# Patient Record
Sex: Male | Born: 1935 | Race: White | Hispanic: No | Smoking: Former smoker
Health system: Southern US, Community
[De-identification: ages and names within clinical notes are randomized; demographics above are authoritative.]

## PROBLEM LIST (undated history)

## (undated) DIAGNOSIS — R7303 Prediabetes: Secondary | ICD-10-CM

## (undated) DIAGNOSIS — I2510 Atherosclerotic heart disease of native coronary artery without angina pectoris: Secondary | ICD-10-CM

## (undated) DIAGNOSIS — K219 Gastro-esophageal reflux disease without esophagitis: Secondary | ICD-10-CM

## (undated) DIAGNOSIS — J40 Bronchitis, not specified as acute or chronic: Secondary | ICD-10-CM

## (undated) DIAGNOSIS — R0602 Shortness of breath: Secondary | ICD-10-CM

## (undated) DIAGNOSIS — E119 Type 2 diabetes mellitus without complications: Secondary | ICD-10-CM

## (undated) DIAGNOSIS — J45909 Unspecified asthma, uncomplicated: Secondary | ICD-10-CM

## (undated) DIAGNOSIS — E669 Obesity, unspecified: Secondary | ICD-10-CM

## (undated) DIAGNOSIS — I10 Essential (primary) hypertension: Secondary | ICD-10-CM

## (undated) DIAGNOSIS — G5603 Carpal tunnel syndrome, bilateral upper limbs: Secondary | ICD-10-CM

## (undated) DIAGNOSIS — E7800 Pure hypercholesterolemia, unspecified: Secondary | ICD-10-CM

## (undated) DIAGNOSIS — M1990 Unspecified osteoarthritis, unspecified site: Secondary | ICD-10-CM

## (undated) DIAGNOSIS — I639 Cerebral infarction, unspecified: Secondary | ICD-10-CM

## (undated) DIAGNOSIS — G4730 Sleep apnea, unspecified: Secondary | ICD-10-CM

## (undated) DIAGNOSIS — E785 Hyperlipidemia, unspecified: Secondary | ICD-10-CM

## (undated) HISTORY — PX: EYE SURGERY: SHX253

## (undated) HISTORY — DX: Obesity, unspecified: E66.9

## (undated) HISTORY — PX: HERNIA REPAIR: SHX51

## (undated) HISTORY — DX: Shortness of breath: R06.02

## (undated) HISTORY — DX: Hyperlipidemia, unspecified: E78.5

## (undated) HISTORY — DX: Essential (primary) hypertension: I10

---

## 1963-10-28 HISTORY — PX: TESTICLE REMOVAL: SHX68

## 1964-10-27 HISTORY — PX: FRACTURE SURGERY: SHX138

## 1998-04-10 ENCOUNTER — Ambulatory Visit: Admission: RE | Admit: 1998-04-10 | Discharge: 1998-04-10 | Payer: Self-pay | Admitting: Internal Medicine

## 1998-11-12 ENCOUNTER — Ambulatory Visit (HOSPITAL_COMMUNITY): Admission: RE | Admit: 1998-11-12 | Discharge: 1998-11-12 | Payer: Self-pay | Admitting: Cardiology

## 1999-08-04 ENCOUNTER — Ambulatory Visit (HOSPITAL_COMMUNITY): Admission: RE | Admit: 1999-08-04 | Discharge: 1999-08-04 | Payer: Self-pay | Admitting: Neurosurgery

## 1999-08-04 ENCOUNTER — Encounter: Payer: Self-pay | Admitting: Neurosurgery

## 1999-10-28 HISTORY — PX: CARDIAC CATHETERIZATION: SHX172

## 2000-02-20 ENCOUNTER — Encounter: Admission: RE | Admit: 2000-02-20 | Discharge: 2000-02-20 | Payer: Self-pay | Admitting: Neurosurgery

## 2000-02-20 ENCOUNTER — Encounter: Payer: Self-pay | Admitting: Neurosurgery

## 2000-06-24 ENCOUNTER — Ambulatory Visit (HOSPITAL_COMMUNITY): Admission: RE | Admit: 2000-06-24 | Discharge: 2000-06-24 | Payer: Self-pay | Admitting: Cardiology

## 2001-01-13 ENCOUNTER — Encounter: Admission: RE | Admit: 2001-01-13 | Discharge: 2001-04-13 | Payer: Self-pay | Admitting: Gastroenterology

## 2007-10-16 ENCOUNTER — Inpatient Hospital Stay (HOSPITAL_COMMUNITY): Admission: EM | Admit: 2007-10-16 | Discharge: 2007-10-19 | Payer: Self-pay | Admitting: Emergency Medicine

## 2007-10-18 ENCOUNTER — Encounter (INDEPENDENT_AMBULATORY_CARE_PROVIDER_SITE_OTHER): Payer: Self-pay | Admitting: Internal Medicine

## 2009-07-11 ENCOUNTER — Ambulatory Visit: Payer: Self-pay | Admitting: Diagnostic Radiology

## 2009-07-11 ENCOUNTER — Encounter: Payer: Self-pay | Admitting: Emergency Medicine

## 2009-07-12 ENCOUNTER — Inpatient Hospital Stay (HOSPITAL_COMMUNITY): Admission: EM | Admit: 2009-07-12 | Discharge: 2009-07-15 | Payer: Self-pay

## 2009-07-17 ENCOUNTER — Inpatient Hospital Stay (HOSPITAL_COMMUNITY): Admission: EM | Admit: 2009-07-17 | Discharge: 2009-07-26 | Payer: Self-pay | Admitting: Emergency Medicine

## 2009-07-23 ENCOUNTER — Encounter (INDEPENDENT_AMBULATORY_CARE_PROVIDER_SITE_OTHER): Payer: Self-pay | Admitting: General Surgery

## 2009-07-25 ENCOUNTER — Ambulatory Visit: Payer: Self-pay | Admitting: Vascular Surgery

## 2009-08-29 ENCOUNTER — Encounter: Admission: RE | Admit: 2009-08-29 | Discharge: 2009-08-29 | Payer: Self-pay | Admitting: Internal Medicine

## 2011-01-31 LAB — CBC
HCT: 30.2 % — ABNORMAL LOW (ref 39.0–52.0)
HCT: 30.6 % — ABNORMAL LOW (ref 39.0–52.0)
HCT: 30.7 % — ABNORMAL LOW (ref 39.0–52.0)
HCT: 31 % — ABNORMAL LOW (ref 39.0–52.0)
HCT: 38 % — ABNORMAL LOW (ref 39.0–52.0)
Hemoglobin: 10.8 g/dL — ABNORMAL LOW (ref 13.0–17.0)
Hemoglobin: 13.2 g/dL (ref 13.0–17.0)
MCHC: 33.6 g/dL (ref 30.0–36.0)
MCHC: 34 g/dL (ref 30.0–36.0)
MCHC: 34.3 g/dL (ref 30.0–36.0)
MCHC: 34.3 g/dL (ref 30.0–36.0)
MCHC: 34.9 g/dL (ref 30.0–36.0)
MCHC: 34.8 g/dL (ref 30.0–36.0)
MCV: 90.4 fL (ref 78.0–100.0)
MCV: 91.1 fL (ref 78.0–100.0)
MCV: 91.3 fL (ref 78.0–100.0)
MCV: 91.9 fL (ref 78.0–100.0)
MCV: 92 fL (ref 78.0–100.0)
MCV: 92.5 fL (ref 78.0–100.0)
MCV: 91.7 fL (ref 78.0–100.0)
Platelets: 150 10*3/uL (ref 150–400)
Platelets: 174 10*3/uL (ref 150–400)
Platelets: 239 10*3/uL (ref 150–400)
Platelets: 250 10*3/uL (ref 150–400)
Platelets: 261 10*3/uL (ref 150–400)
Platelets: 274 10*3/uL (ref 150–400)
Platelets: 278 10*3/uL (ref 150–400)
Platelets: 300 10*3/uL (ref 150–400)
Platelets: 201 K/uL (ref 150–400)
RBC: 3.33 MIL/uL — ABNORMAL LOW (ref 4.22–5.81)
RBC: 3.34 MIL/uL — ABNORMAL LOW (ref 4.22–5.81)
RBC: 3.35 MIL/uL — ABNORMAL LOW (ref 4.22–5.81)
RBC: 3.57 MIL/uL — ABNORMAL LOW (ref 4.22–5.81)
RBC: 4.14 MIL/uL — ABNORMAL LOW (ref 4.22–5.81)
RDW: 13.1 % (ref 11.5–15.5)
RDW: 14 % (ref 11.5–15.5)
RDW: 12.5 % (ref 11.5–15.5)
WBC: 13.9 10*3/uL — ABNORMAL HIGH (ref 4.0–10.5)
WBC: 3.9 10*3/uL — ABNORMAL LOW (ref 4.0–10.5)
WBC: 4.6 10*3/uL (ref 4.0–10.5)
WBC: 8.5 10*3/uL (ref 4.0–10.5)
WBC: 8.8 10*3/uL (ref 4.0–10.5)
WBC: 11.6 K/uL — ABNORMAL HIGH (ref 4.0–10.5)

## 2011-01-31 LAB — URINALYSIS, ROUTINE W REFLEX MICROSCOPIC
Glucose, UA: NEGATIVE mg/dL
Ketones, ur: 15 mg/dL — AB
Leukocytes, UA: NEGATIVE
Leukocytes, UA: NEGATIVE
Nitrite: NEGATIVE
Nitrite: NEGATIVE
Protein, ur: 100 mg/dL — AB
Specific Gravity, Urine: >1.046 — ABNORMAL HIGH (ref 1.005–1.030)
Specific Gravity, Urine: 1.021 (ref 1.005–1.030)
Urobilinogen, UA: 0.2 mg/dL (ref 0.0–1.0)
pH: 6 (ref 5.0–8.0)
pH: 5 (ref 5.0–8.0)

## 2011-01-31 LAB — GLUCOSE, CAPILLARY
Glucose-Capillary: 122 mg/dL — ABNORMAL HIGH (ref 70–99)
Glucose-Capillary: 122 mg/dL — ABNORMAL HIGH (ref 70–99)
Glucose-Capillary: 126 mg/dL — ABNORMAL HIGH (ref 70–99)
Glucose-Capillary: 129 mg/dL — ABNORMAL HIGH (ref 70–99)
Glucose-Capillary: 130 mg/dL — ABNORMAL HIGH (ref 70–99)
Glucose-Capillary: 137 mg/dL — ABNORMAL HIGH (ref 70–99)
Glucose-Capillary: 141 mg/dL — ABNORMAL HIGH (ref 70–99)
Glucose-Capillary: 146 mg/dL — ABNORMAL HIGH (ref 70–99)
Glucose-Capillary: 149 mg/dL — ABNORMAL HIGH (ref 70–99)

## 2011-01-31 LAB — COMPREHENSIVE METABOLIC PANEL
ALT: 18 U/L (ref 0–53)
ALT: 19 U/L (ref 0–53)
AST: 25 U/L (ref 0–37)
AST: 27 U/L (ref 0–37)
AST: 31 U/L (ref 0–37)
AST: 43 U/L — ABNORMAL HIGH (ref 0–37)
Albumin: 2.7 g/dL — ABNORMAL LOW (ref 3.5–5.2)
Albumin: 2.8 g/dL — ABNORMAL LOW (ref 3.5–5.2)
Albumin: 3.4 g/dL — ABNORMAL LOW (ref 3.5–5.2)
Alkaline Phosphatase: 46 U/L (ref 39–117)
BUN: 20 mg/dL (ref 6–23)
CO2: 27 mEq/L (ref 19–32)
CO2: 27 mEq/L (ref 19–32)
Calcium: 7.4 mg/dL — ABNORMAL LOW (ref 8.4–10.5)
Calcium: 8.1 mg/dL — ABNORMAL LOW (ref 8.4–10.5)
Calcium: 8.2 mg/dL — ABNORMAL LOW (ref 8.4–10.5)
Chloride: 92 mEq/L — ABNORMAL LOW (ref 96–112)
Chloride: 96 mEq/L (ref 96–112)
Creatinine, Ser: 0.83 mg/dL (ref 0.4–1.5)
Creatinine, Ser: 1.01 mg/dL (ref 0.4–1.5)
Creatinine, Ser: 1.06 mg/dL (ref 0.4–1.5)
GFR calc Af Amer: >60 mL/min (ref >60)
GFR calc Af Amer: >60 mL/min (ref >60)
GFR calc Af Amer: >60 mL/min (ref >60)
GFR calc Af Amer: >60 mL/min (ref >60)
GFR calc non Af Amer: >60 mL/min (ref >60)
GFR calc non Af Amer: >60 mL/min (ref >60)
Potassium: 3.2 mEq/L — ABNORMAL LOW (ref 3.5–5.1)
Sodium: 135 mEq/L (ref 135–145)
Sodium: 137 mEq/L (ref 135–145)
Total Bilirubin: 1.1 mg/dL (ref 0.3–1.2)
Total Bilirubin: 1.7 mg/dL — ABNORMAL HIGH (ref 0.3–1.2)
Total Protein: 5.7 g/dL — ABNORMAL LOW (ref 6.0–8.3)
Total Protein: 5.9 g/dL — ABNORMAL LOW (ref 6.0–8.3)

## 2011-01-31 LAB — BASIC METABOLIC PANEL
BUN: 10 mg/dL (ref 6–23)
BUN: 13 mg/dL (ref 6–23)
BUN: 13 mg/dL (ref 6–23)
CO2: 26 mEq/L (ref 19–32)
CO2: 29 mEq/L (ref 19–32)
Calcium: 7.8 mg/dL — ABNORMAL LOW (ref 8.4–10.5)
Calcium: 9.6 mg/dL (ref 8.4–10.5)
Chloride: 103 mEq/L (ref 96–112)
Chloride: 106 mEq/L (ref 96–112)
Creatinine, Ser: 0.83 mg/dL (ref 0.4–1.5)
Creatinine, Ser: 0.84 mg/dL (ref 0.4–1.5)
Creatinine, Ser: 0.85 mg/dL (ref 0.4–1.5)
GFR calc Af Amer: >60 mL/min (ref >60)
GFR calc Af Amer: >60 mL/min (ref >60)
GFR calc Af Amer: 56 mL/min — ABNORMAL LOW (ref >60)
GFR calc non Af Amer: 48 mL/min — ABNORMAL LOW (ref >60)
GFR calc non Af Amer: >60 mL/min (ref >60)
Glucose, Bld: 90 mg/dL (ref 70–99)
Potassium: 3.5 mEq/L (ref 3.5–5.1)
Potassium: 3.7 mEq/L (ref 3.5–5.1)
Potassium: 4.2 mEq/L (ref 3.5–5.1)
Potassium: 3.9 mEq/L (ref 3.5–5.1)
Sodium: 138 mEq/L (ref 135–145)
Sodium: 142 mEq/L (ref 135–145)

## 2011-01-31 LAB — DIFFERENTIAL
Basophils Absolute: 0 K/uL (ref 0.0–0.1)
Basophils Relative: 0 % (ref 0–1)
Basophils Relative: 0 % (ref 0–1)
Eosinophils Absolute: 0 K/uL (ref 0.0–0.7)
Eosinophils Relative: 0 % (ref 0–5)
Lymphocytes Relative: 20 % (ref 12–46)
Lymphocytes Relative: 14 % (ref 12–46)
Lymphs Abs: 1.6 K/uL (ref 0.7–4.0)
Monocytes Absolute: 1.2 10*3/uL — ABNORMAL HIGH (ref 0.1–1.0)
Monocytes Relative: 28 % — ABNORMAL HIGH (ref 3–12)
Monocytes Relative: 10 % (ref 3–12)
Neutro Abs: 2 10*3/uL (ref 1.7–7.7)
Neutro Abs: 8.8 10*3/uL — ABNORMAL HIGH (ref 1.7–7.7)
Neutrophils Relative %: 75 % (ref 43–77)

## 2011-01-31 LAB — URINE CULTURE
Colony Count: NO GROWTH
Colony Count: NO GROWTH
Culture: NO GROWTH
Culture: NO GROWTH

## 2011-01-31 LAB — URINE MICROSCOPIC-ADD ON

## 2011-01-31 LAB — APTT: aPTT: 23 seconds — ABNORMAL LOW (ref 24–37)

## 2011-01-31 LAB — PREALBUMIN: Prealbumin: 9.5 mg/dL — ABNORMAL LOW (ref 18.0–45.0)

## 2011-01-31 LAB — BASIC METABOLIC PANEL WITH GFR
BUN: 22 mg/dL (ref 6–23)
Chloride: 102 meq/L (ref 96–112)
Creatinine, Ser: 1.5 mg/dL (ref 0.4–1.5)
GFR calc non Af Amer: 46 mL/min — ABNORMAL LOW (ref >60)
Glucose, Bld: 96 mg/dL (ref 70–99)

## 2011-01-31 LAB — HEMOCCULT GUIAC POC 1CARD (OFFICE): Fecal Occult Bld: POSITIVE

## 2011-01-31 LAB — PROTIME-INR
INR: 1 (ref 0.00–1.49)
Prothrombin Time: 13 s (ref 11.6–15.2)

## 2011-03-11 NOTE — H&P (Signed)
Jeff Watkins, Jeff Watkins                  ACCOUNT NO.:  [id number]   MEDICAL RECORD NO.:  [id number]          PATIENT TYPE:  EMS   LOCATION:  MAJO                         FACILITY:  [facility]   PHYSICIAN:  Kela Millin, M.D.DATE OF BIRTH:  Feb 12, 1936   DATE OF ADMISSION:  10/16/2007  DATE OF DISCHARGE:                              HISTORY & PHYSICAL   PRIMARY CARE PHYSICIAN:  Tasia Catchings, M.D.   CHIEF COMPLAINT:  Left upper extremity numbness and weakness.   HISTORY OF PRESENT ILLNESS:  The patient is a 75 year old, obese, white  male with past medical history significant for diabetes that is diet  controlled, hypertension, hypercholesterolemia who presents with above  complaints. He states that the numbness and tingling/paresthesias began  this morning and also his arm felt limp and his grasp/grip was weaker.  Because the symptoms persisted for some time, he came to the ER. He  denies blurred vision, dysphagia, slurred speech, chest pain, shortness  of breath, melena, and no hematochezia. He admits to a cough that he has  had for the past couple of weeks and states that he was recently  diagnosed with bronchitis and started on Tussionex and Zithromax which  he has not taken because his symptoms have improved.   The patient was seen in the ER and a CT scan of his brain was done which  was negative for any acute intracranial findings. He is admitted for  further evaluation and management.   PAST MEDICAL HISTORY:  1. As above.  2. Status post cardiac catheterization in 2001 which revealed      borderline obstructive LAD lesion.   MEDICATIONS:  1. Aspirin 81 mg daily.  2. Caduet 40/10 mg daily.  3. Fish oil.  4. Hyzaar 50/12.5 mg daily.  5. Prilosec 40 mg daily.  6. Multivitamin daily.  7. Vitamin D.  8. Slow release iron tablets.   ALLERGIES:  PENICILLIN and SULFA. He denies codeine allergy stating that  it has caused him to be drowsy in the past but he does not remember  any  allergic reaction to it.   SOCIAL HISTORY:  He quit tobacco 50 years ago. He denies alcohol.   FAMILY HISTORY:  His father had a stroke at the age of 71.   REVIEW OF SYSTEMS:  As per HPI, other review of systems negative.   PHYSICAL EXAMINATION:  GENERAL:  The patient is an obese, pleasant,  elderly white male. He is in no apparent distress.  VITAL SIGNS:  His temperature is 98.9 with a blood pressure of 153/89,  pulse of 83, respiratory rate of 18, O2 sat of 94%.  HEENT:  PERRL, EOMI, sclera anicteric, no facial asymmetry. No oral  exudates.  NECK:  Supple, no carotid bruits appreciated, no adenopathy, no  thyromegaly.  LUNGS:  Clear to auscultation bilaterally, no crackles or wheezes.  CARDIOVASCULAR:  Regular rate and rhythm, normal S1 and S2.  ABDOMEN:  Obese, bowel sounds present, nontender, nondistended, no  organomegaly and no masses palpable.  EXTREMITIES:  No cyanosis, no edema.  NEUROLOGIC:  He is alert and  oriented x3. Cranial nerves II-XII are  grossly intact, his strength is 5/5 and symmetric. Sensory is grossly  intact.   LABORATORY DATA:  CT scan of the head as per HPI. His sodium is 138 with  a potassium of 4.2, chloride 105, BUN 15, glucose 99. His creatinine is  1.0, white cell count of 6, hemoglobin 14.1, hematocrit of 41.5,  platelet count of 249.   ASSESSMENT/PLAN:  1. Left arm paresthesias/weakness - now resolved. Probable transient      ischemic attack. Will obtain an MRI, a 2-D echo, carotid Doppler      ultrasound. Also obtain a homocystine level, recheck fasting lipid      profile and hemoglobin A1c level. Place patient on full dose      aspirin - 325 mg. Follow and consider a neurology consultation      depending on above studies.  2. Diabetes mellitus - diet controlled per patient report, obtain a      hemoglobin A1c although, follow with sliding scale insulin and      follow.  3. Hypertension - continue outpatient medications.  4.  Hypercholesterolemia - continue outpatient medications. Recheck      fasting lipid profile as above.      Kela Millin, M.D.  Electronically Signed     ACV/MEDQ  D:  10/16/2007  T:  10/16/2007  Job:  [id number]

## 2011-03-11 NOTE — Consult Note (Signed)
Jeff Watkins, Jeff Watkins                  ACCOUNT NO.:  [id number]   MEDICAL RECORD NO.:  [id number]          PATIENT TYPE:  INP   LOCATION:  3008                         FACILITY:  [facility]   PHYSICIAN:  Deanna Artis. Hickling, M.D.DATE OF BIRTH:  23-Aug-1936   DATE OF CONSULTATION:  10/18/2007  DATE OF DISCHARGE:                                 CONSULTATION   CHIEF COMPLAINT:  Left-sided weakness and clumsiness.   HISTORY OF PRESENT CONDITION:  [name] is a 75 year old gentleman who  presented with complaints of left upper extremity numbness and weakness.  This occurred around 8:45 in the morning on October 16, 2007.  He had  awakened and was having his coffee.  He suddenly noted numbness and  paresthesias in his left arm, and difficulty with his grip.  He did not  awaken his wife who is a Engineer, civil (consulting).  Finally he had did so and she felt that  he should come to the hospital for evaluation of possible stroke.  He  arrived around 1132 hours and 45 minutes into his symptoms.  By that  time, the symptoms had begun to clear and they had completely cleared 4-  5 hours following the stroke.   The patient had a CT scan of the brain which showed some atrophy and  some small-vessel white matter changes.  MRI scan of the brain on  October 17, 2007 that showed restricted diffusion, but also  abnormalities on the ADC maps, the FLAIR and the T2 images.  The MRA  showed evidence of narrowing of the proximal portion of the right middle  cerebral artery and also some narrowing in the posterior cerebral  arteries.  There were mild atherosclerotic changes elsewhere, but no  true occlusive disease.  Finally, the MRI scan showed diffuse  subcortical white matter disease in the centrum semiovale, frontal,  parietal also within bilaterally lesions in the mid brain and asymmetric  lesions in the cerebellar white matter.  All this strongly suggested the  presence of some small-vessel white matter disease and not  cardioembolic  disease.   The patient has a number risk factors for stroke that include  hypertension, dyslipidemia, morbid obesity, heart disease, obstructive  sleep apnea and remote history of smoking.   Patient has been followed long-term by Dr. Tasia Catchings.  Dr. Sherin Quarry  notes in his office dictation of Feb 25, 2007, the patient lost 80 pounds  (he has gained about 20-30 of it back.)  His hemoglobin A1c dropped from  8.5-5.7.  Currently it is 6.0.  The patient has not required oral  hypoglycemics has maintained his LDL below 100.   PAST MEDICAL HISTORY:  1. Sleep apnea.  This was proven on a 1999 sleep study which also      showed restless leg syndrome.  His symptoms were mild and the      obstructive sleep apnea improved greatly once he lost weight.  2. 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  4. Cervical spondylosis with pain in his right shoulder and right arm      in 1998 which has been managed conservatively with nonsteroidal      medications.  5. GI bleeding that was occasioned by the use of nonsteroidals.  6. Intermittent bronchitis which has been quiescent since 1996.  7. Chronic dyspnea on exertion without chest pain.  Multiple stress      tests and cardiac catheterization with nonocclusive disease.  The      patient also improved with weight loss there as well.  8. The patient apparently had outside screening of carotid Dopplers in      2008 which were okay.  This had to have been before May 2008 and      needs to be repeated.  9. He had colonic polyps and diverticulosis there were seen on      colonoscopy in 2003.  10.  10.  The patient has mild iron      deficiency, which on repeat colonoscopy found only diverticuli.  10.He has had external otitis media.  11.Hypertension treated with multiple antihypertensive agents.  12.Dyslipidemia, currently with  a low HDL, but normal triglyceride and      LDL.  13.Peptic ulcer disease with remote bleeding.  14.Gastroesophageal reflux disease.  15.Erectile dysfunction.  16.Occasional headaches.   CURRENT MEDICATIONS:  1. Aspirin 325 mg a day.  2. Norvasc 10 mg a day.  3. Protonix 40 mg a day.  4. Hydrochlorothiazide 12.5 mg a day.  5. Lipitor 40 mg a day.  6. He is on Cozaar 50 mg a day.  7. Nu-Iron 150 mg twice daily.  8. Multivitamins daily.  9. He has sliding-scale insulin, but has not needed it.  10.Plavix was just added to his treatment 75 mg a day.  11.He is also on Lovenox for DVT prophylaxis.  12.Senokot for constipation.   ALLERGIES:  1. SULFA.  2. PENICILLIN.   PAST SURGICAL HISTORY:  1. Left inguinal herniorrhaphy.  2. Right undescended testicle which was removed.  3. The patient had severe comminuted fracture in his right arm with      multiple operations.   FAMILY HISTORY:  Father died at age 72 of cerebrovascular accident and  aspiration pneumonia.  His first stroke occurred when he was 62.  Mother  died age 33 in a motor vehicle accident.  The patient has 3 brothers  alive with peptic ulcer disease, 2 have had surgery, 1 with colon cancer  at age 20.  He has 2 children, 1 daughter died of AIDS and a son who is  alive and well.  The patient's paternal aunt also had stroke.   SOCIAL HISTORY:  The patient lives in [place].  Retired from working  as a Teaching laboratory technician at [employer]  He is a Engineer, agricultural.  He  works part-time in [employer], dispensing.  He has  been married since  1964.  He used tobacco as an adolescent.  He does not use alcohol or  drugs.   REVIEW OF SYSTEMS:  Negative except as noted above in the extensive past  medical history.   PHYSICAL EXAMINATION:  GENERAL:  This is a well-developed, well-  nourished, right-handed gentleman in no acute distress, obese.  VITAL SIGNS:  Temperature 98.3, blood pressure 137/77, resting pulse 57,   respirations 20, capillary glucose 130, oxygen saturation 94% on room  air.  EARS/NOSE/THROAT:  No infections.  No bruits.  NECK:  Supple.  LUNGS:  Clear.  HEART:  No murmurs.  Pulses are normal.  ABDOMEN:  Protuberant.  Bowel sounds are normal.  No hepatosplenomegaly.  EXTREMITIES:  Normal.  NEUROLOGIC:  The patient awake and alert without dysphasia, dyspraxia.  CRANIAL NERVES:  Round reactive pupils.  Visual fields are full.  His  fundi are very difficult to see because the pupils are so small.  Extraocular movements full and conjugate.  Symmetric facial strength and  sensation.  Midline tongue and uvula.  Air conduction greater than bone  conduction bilaterally.  MOTOR:  Normal strength, tone and mass.  Good fine motor movements.  No  pronator drift.  Sensation intact to cold vibration, stereognosis.  He  does not have significant peripheral polyneuropathy despite his history  of diabetes.  CEREBELLAR:  Good finger-to-nose, rapid alternating movements and heel-  knee-shin.  Gait was normal.  Interestingly, the patient cannot tandem.  I suspect this is from his cerebellar and mid brain lesions.  This is an  old problem.  His Romberg is negative.  Deep tendon reflexes were  diminished.  He had bilateral flexor plantar responses.   IMPRESSION:  1. Subacute right parietal subcentimeter white matter lacunar-type      lesion thought to be thrombotic process, 434.01.  2. Multiple risk factors for stroke including hypertension, controlled      type 2 diabetes mellitus, morbid obesity, atherosclerotic      cardiovascular disease, dyslipidemia, remote smoking, obstructive      sleep apnea.  3. The patient has lost weight.  His hemoglobin A1c today is 6, serum      homocysteine is 9, LDL 70, HDL was low at 28, triglycerides 101.      The patient is physically and mentally quite active.  He has not      smoked since adolescence.  MRI scan and MRA are described above.      The narrowing in  the right middle cerebral artery is not      etiologically related to his small vessel white matter disease      stroke.  I suspect that he has had other strokes that are silent.  4. He has sinus bradycardia.  He carotid Dopplers and 2-D      echocardiogram are pending.   RECOMMENDATIONS:  1. I agree with change to Plavix 75 mg a day.  I would discontinue      aspirin altogether.  The combination is not better than Plavix      alone and can increase the risk of bleeding (MATCH trial).  2. Continue current treatment of his other conditions.  I do not know      if we can improve his low HDL by changing his statin drugs or      changing possibly to Tricor.  3. The patient was not a candidate for IV or intra-arterial TPA      because of the time to presentation was 2 hours and 45 minutes      after he was last normal and his improving symptoms.  4. Before discharge, he needs counseling concerning diet.  He will      continue compliance with medical regimen, maintaining an active      lifestyle.  5. Follow up with [place] as needed.  Patient's NIH stroke scale carried      out at 9:45 was zero. His modified Rankin scale is zero.      Deanna Artis. Sharene Skeans, M.D.  Electronically Signed  [facility]/MEDQ  D:  10/18/2007  T:  10/18/2007  Job:  [id number]   cc:   Tasia Catchings, M.D.

## 2011-03-11 NOTE — Discharge Summary (Signed)
NAMEOSCAR, HANK NO.:  [id number]   MEDICAL RECORD NO.:  [id number]          PATIENT TYPE:  INP   LOCATION:  3008                         FACILITY:  [facility]   PHYSICIAN:  Tasia Catchings, M.D.   DATE OF BIRTH:  February 17, 1936   DATE OF ADMISSION:  10/16/2007  DATE OF DISCHARGE:  10/19/2007                               DISCHARGE SUMMARY   DISCHARGE DIAGNOSES:  1. Right cerebrovascular accident with transient left-sided arm      weakness which has resolved with negative embolic workup.  2. Hypertension.  3. Diabetes.  4. Nonocclusive coronary disease.  5. Gastroesophageal reflux disease.  6. Hypercholesterolemia with low HDL.  7. Obesity.  8. Low testosterone.  9. Facial herpes simplex.  10.Colon polyps.  11.Sleep apnea.   DISCHARGE MEDICATIONS:  1. Plavix 75 mg daily which is new and he is to discontinue all      aspirin.  2. Norvasc 10 and Lipitor 40 in the form of Caduet daily.  3. Prilosec OTC 20 mg daily.  4. Hyzaar 12.5/50 daily.  5. Nu-Iron two tablets.  6. AndroGel 1% two packs daily.  7. Zovirax and Allegra as needed.   CONDITION ON DISCHARGE:  Improved.   DIET:  1500 calorie, ADA.   ACTIVITY:  Without restriction.   FOLLOW UP:  Follow up at his regularly scheduled appointment.   HOSPITAL COURSE:  Mr. Edmonds was admitted on December 20, with a several  hour history of incoordination, weakness and numbness of his left arm  that by the time he reached the emergency room was improving and by the  time he was admitted by our [facility] was absolutely gone.  He  was admitted anyway for further workup.  The remainder of his physical  exam was unremarkable.  Laboratory data was normal including a  hemoglobin A1c of 6.0.  His homocystine level was normal as well.  CT  scan of the head was unremarkable, but an MRI revealed a small lacunar  stroke in the right parietal area that was consistent with his left-  sided weakness as well as multiple,  small, white matter strokes.  MRA  was unremarkable with the exception of a narrowing of the middle  cerebral artery which appeared to be anatomically distinct from where  his stroke was.  Embolic workup was negative including  carotid Dopplers and 2-D echocardiogram.  The patient was seen by Dr.  Ellison Carwin and underwent evaluation neurologically and was found  to have had probably a right CVA with resolution.  His recommendation  was Plavix over aspirin and he was discharged on the above medications.      Tasia Catchings, M.D.  Electronically Signed     JW/MEDQ  D:  10/19/2007  T:  10/19/2007  Job:  [id number]

## 2011-03-14 NOTE — Cardiovascular Report (Signed)
[facility]  Patient:    Jeff Watkins, Jeff Watkins                         MRN: [id number] Proc. Date: 06/24/00 Adm. Date:  [id number] Attending:  Corliss Marcus CC:         Genene Churn. Sherin Quarry, M.D.  [facility]   Cardiac Catheterization  PROCEDURE PERFORMED 1. Left heart catheterization. 2. Coronary angiography. 3. Intravascular ultrasound, left anterior descending artery. 4. Intravascular ultrasound, right coronary artery (REVERSAL study).  INDICATIONS:  Jeff Watkins is a 75 year old man who is now 20 months after a cardiac catheterization revealing mild nonobstructive atherosclerotic coronary vascular disease; this was performed because of dyspnea on exertion.  He was enrolled in the REVERSAL trial at that time and is now completing 20 months of treatment with either Lipitor 80 mg or Pravachol 40 mg p.o. q.d.  He has been doing well recently.  He denies any recent symptoms.  PROCEDURAL NOTE:  Left heart catheterization was performed following the percutaneous insertion of a 6-French catheter sheath utilizing an anterior approach over a guiding J wire.  A 5-French #4 left Judkins catheter was used to perform coronary angiography of the left coronary in multiple LAO and RAO projections.  This was exchanged from a 7-French FR4 Sci-Med Wise-Guide guiding catheter, which was used to perform right coronary artery angiography, as well as intravascular ultrasound using the Sci-Med Ultra-Cross catheter. The patient received heparin 3000 units prior to initiation of the intravascular ultrasound, resulting in an ACT of 181 seconds; he then received an additional 1000 units of heparin.  After completion of the right coronary artery ultrasound, the 7-French FR4 Wise-Guide guiding catheter was exchanged for a 7-French FL4 Wise-Guide and the left coronary os was engaged.  A 0.014-inch ACS BMW intracoronary guidewire was used to cross  a moderate-appearing stenosis in the mid-to-distal portion of the LAD.  An intravascular ultrasound was performed of this lesion.  At the completion of the procedure, the guiding catheter and guidewire were removed.  Hemostasis was achieved by direct pressure, after removal of the catheter sheath.  The ACT was 108.  Patient was transported to the recovery area in stable condition, with intact distal pulses.  ANGIOGRAPHY:  The left circumflex artery angiogram revealed a right-dominant coronary system present.  The main left coronary artery was normal.  The left anterior descending artery and its branches were moderately diseased; this vessel had about a 30% stenosis in the midportion of the vessel right after the origin of the second large diagonal branch, then in the junction of the mid and distal segment, there appeared to be a 50-70% narrowing.  The old angiograms were reviewed and this did seem to represent progression of disease.  The left circumflex artery and its branch were without significant obstruction whatsoever.  There were some luminal irregularities but no measurable stenosis.  A large first marginal and a large obtuse marginal branch arise.  The right coronary artery and its branches reveal luminal irregularities throughout the entire course of the vessel.  No significant or measurable obstructions are seen.  It gives rise to a single large posterior descending artery.  INTRAVASCULAR ULTRASOUND:  Intravascular ultrasound of the right coronary artery reveals no significant obstructions.  There is sub-intimal plaque throughout and intermittent calcification.  Intravascular ultrasound of the mid-to-distal left anterior descending artery reveals a focal stenosis which is partially calcified and measures 49% by diameter and  74% by area.  FINAL IMPRESSION 1. Borderline obstructive lesion in the left anterior descending artery in an    asymptomatic 75 year old man. 2. No  significant right coronary or left circumflex artery obstruction.  PLAN:  Will continue medical therapy after the patient completes the REVERSAL study with either Lipitor or Pravachol.  The patient will be followed closely for any symptoms or ischemia that may be generated as a result of the anterior descending artery lesion. DD:  06/24/00 TD:  06/24/00 Job: [id number]

## 2011-08-01 LAB — HOMOCYSTEINE: Homocysteine: 9.1

## 2011-08-01 LAB — POCT I-STAT CREATININE
Creatinine, Ser: 1
Operator id: 196461

## 2011-08-01 LAB — I-STAT 8, (EC8 V) (CONVERTED LAB)
BUN: 15
Chloride: 105
HCT: 44
Operator id: 196461
pCO2, Ven: 45.3

## 2011-08-01 LAB — CBC
HCT: 41.5
Hemoglobin: 14.1
RBC: 4.61
WBC: 6

## 2011-08-01 LAB — DIFFERENTIAL
Eosinophils Relative: 3
Lymphocytes Relative: 40
Lymphs Abs: 2.4
Monocytes Absolute: 0.5
Monocytes Relative: 9

## 2011-08-01 LAB — LIPID PANEL
HDL: 28 — ABNORMAL LOW
Total CHOL/HDL Ratio: 4.2

## 2012-06-01 ENCOUNTER — Ambulatory Visit (HOSPITAL_COMMUNITY)
Admission: RE | Admit: 2012-06-01 | Discharge: 2012-06-01 | Disposition: A | Discharge status: Home / Self Care | Payer: Medicare Other | Source: Ambulatory Visit | Attending: Internal Medicine | Admitting: Internal Medicine

## 2012-06-01 DIAGNOSIS — R1310 Dysphagia, unspecified: Secondary | ICD-10-CM

## 2012-06-01 DIAGNOSIS — I10 Essential (primary) hypertension: Secondary | ICD-10-CM | POA: Insufficient documentation

## 2012-06-01 DIAGNOSIS — Z79899 Other long term (current) drug therapy: Secondary | ICD-10-CM | POA: Insufficient documentation

## 2012-06-01 DIAGNOSIS — R1313 Dysphagia, pharyngeal phase: Secondary | ICD-10-CM | POA: Insufficient documentation

## 2012-06-01 DIAGNOSIS — I69991 Dysphagia following unspecified cerebrovascular disease: Secondary | ICD-10-CM | POA: Insufficient documentation

## 2012-06-01 DIAGNOSIS — K259 Gastric ulcer, unspecified as acute or chronic, without hemorrhage or perforation: Secondary | ICD-10-CM | POA: Insufficient documentation

## 2012-06-01 DIAGNOSIS — E119 Type 2 diabetes mellitus without complications: Secondary | ICD-10-CM | POA: Insufficient documentation

## 2012-06-01 DIAGNOSIS — I2510 Atherosclerotic heart disease of native coronary artery without angina pectoris: Secondary | ICD-10-CM | POA: Insufficient documentation

## 2012-06-01 NOTE — Evaluation (Signed)
Objective Swallowing Evaluation: Modified Barium Swallowing Study  Patient Details  Name: Jeff Watkins MRN: [id number] Date of Birth: 1936-03-12  Today's Date: 06/01/2012 Time: 1130-1150 SLP Time Calculation (min): 20 min  Past Medical History: No past medical history on file. Past Surgical History: No past surgical history on file. HPI:  76 yr old seen for outpatient MBS.  History of CVA '09, HTN, DM, GI bleed, CAD, gastric ulcers (takes Protonix).  Pt. reports intermittent coughing with thin liquids and sometimes while brushing his teeth that began approximately one year ago.  No history of pna.                    Assessment / Plan / Recommendation Clinical Impression  Dysphagia Diagnosis: Mild pharyngeal phase dysphagia Clinical impression: Pt. exhibited minimal-mild pharyngeal dysphagia with intermittent laryngeal penetration with thin liquid.  Penetration episodes spontaneously exited the vestibule during the swallow (flash penetration) except for two occasions where a trace amount of barium remained on anterior wall of vestibule.  A chin tuck strategy resulted in increased effort to initiate swallow with vallecular and pyriform sinus residue, therefore not an effective strategy.  Recommend he continue regular texture diet and thin liquid with precautions such as small sips (cup sips preferred).  Pt. has been able to withstand likely intermittent penetration/aspiration.  Educated pt. to use extra caution if health status declines as his functional reserve would be decreased.     Treatment Recommendation  No treatment recommended at this time    Diet Recommendation Regular;Thin liquid   Liquid Administration via: Cup Medication Administration: Whole meds with liquid (Educated using puree for pills if needed) Compensations: Small sips/bites;Slow rate Postural Changes and/or Swallow Maneuvers: Seated upright 90 degrees    Other  Recommendations Oral Care Recommendations: Oral care BID     Follow Up Recommendations  None                    Reason for Referral Objectively evaluate swallowing function   Oral Phase     Pharyngeal Phase Pharyngeal Phase: Impaired   Cervical Esophageal Phase Cervical Esophageal Phase: [facility]    Darrow Bussing.Ed [employer] [phone]  06/01/2012

## 2013-10-17 ENCOUNTER — Other Ambulatory Visit: Payer: Self-pay | Admitting: Internal Medicine

## 2013-10-17 ENCOUNTER — Encounter (HOSPITAL_COMMUNITY): Payer: Self-pay | Admitting: Emergency Medicine

## 2013-10-17 ENCOUNTER — Inpatient Hospital Stay (HOSPITAL_COMMUNITY)
Admission: EM | Admit: 2013-10-17 | Discharge: 2013-10-23 | DRG: 339 | Disposition: A | Discharge status: Home / Self Care | Payer: Medicare Other | Attending: General Surgery | Admitting: General Surgery

## 2013-10-17 ENCOUNTER — Emergency Department (HOSPITAL_COMMUNITY): Payer: Medicare Other

## 2013-10-17 ENCOUNTER — Ambulatory Visit
Admission: RE | Admit: 2013-10-17 | Discharge: 2013-10-17 | Disposition: A | Discharge status: Home / Self Care | Payer: Medicare Other | Source: Ambulatory Visit | Attending: Internal Medicine | Admitting: Internal Medicine

## 2013-10-17 DIAGNOSIS — Z8673 Personal history of transient ischemic attack (TIA), and cerebral infarction without residual deficits: Secondary | ICD-10-CM

## 2013-10-17 DIAGNOSIS — Z7902 Long term (current) use of antithrombotics/antiplatelets: Secondary | ICD-10-CM

## 2013-10-17 DIAGNOSIS — R062 Wheezing: Secondary | ICD-10-CM | POA: Diagnosis present

## 2013-10-17 DIAGNOSIS — E119 Type 2 diabetes mellitus without complications: Secondary | ICD-10-CM | POA: Diagnosis present

## 2013-10-17 DIAGNOSIS — K219 Gastro-esophageal reflux disease without esophagitis: Secondary | ICD-10-CM | POA: Diagnosis present

## 2013-10-17 DIAGNOSIS — R1031 Right lower quadrant pain: Secondary | ICD-10-CM

## 2013-10-17 DIAGNOSIS — R109 Unspecified abdominal pain: Secondary | ICD-10-CM

## 2013-10-17 DIAGNOSIS — K35209 Acute appendicitis with generalized peritonitis, without abscess, unspecified as to perforation: Principal | ICD-10-CM | POA: Diagnosis present

## 2013-10-17 DIAGNOSIS — K37 Unspecified appendicitis: Secondary | ICD-10-CM | POA: Diagnosis present

## 2013-10-17 DIAGNOSIS — K560 Paralytic ileus: Secondary | ICD-10-CM | POA: Diagnosis not present

## 2013-10-17 DIAGNOSIS — E7800 Pure hypercholesterolemia, unspecified: Secondary | ICD-10-CM | POA: Diagnosis present

## 2013-10-17 DIAGNOSIS — K352 Acute appendicitis with generalized peritonitis, without abscess: Principal | ICD-10-CM | POA: Diagnosis present

## 2013-10-17 DIAGNOSIS — I10 Essential (primary) hypertension: Secondary | ICD-10-CM | POA: Diagnosis present

## 2013-10-17 HISTORY — DX: Type 2 diabetes mellitus without complications: E11.9

## 2013-10-17 HISTORY — DX: Cerebral infarction, unspecified: I63.9

## 2013-10-17 HISTORY — DX: Essential (primary) hypertension: I10

## 2013-10-17 HISTORY — DX: Pure hypercholesterolemia, unspecified: E78.00

## 2013-10-17 LAB — PROTIME-INR: Prothrombin Time: 13.6 seconds (ref 11.6–15.2)

## 2013-10-17 LAB — CBC WITH DIFFERENTIAL/PLATELET
Basophils Relative: 0 % (ref 0–1)
Eosinophils Absolute: 0 10*3/uL (ref 0.0–0.7)
Eosinophils Relative: 0 % (ref 0–5)
HCT: 42.8 % (ref 39.0–52.0)
Hemoglobin: 14.7 g/dL (ref 13.0–17.0)
Lymphs Abs: 1.7 10*3/uL (ref 0.7–4.0)
MCH: 31.5 pg (ref 26.0–34.0)
MCHC: 34.3 g/dL (ref 30.0–36.0)
MCV: 91.8 fL (ref 78.0–100.0)
Monocytes Absolute: 0.8 10*3/uL (ref 0.1–1.0)
Monocytes Relative: 7 % (ref 3–12)
RBC: 4.66 MIL/uL (ref 4.22–5.81)
WBC: 12.5 10*3/uL — ABNORMAL HIGH (ref 4.0–10.5)

## 2013-10-17 LAB — COMPREHENSIVE METABOLIC PANEL
Albumin: 3.9 g/dL (ref 3.5–5.2)
BUN: 15 mg/dL (ref 6–23)
Calcium: 9 mg/dL (ref 8.4–10.5)
Creatinine, Ser: 0.98 mg/dL (ref 0.50–1.35)
GFR calc Af Amer: >90 mL/min (ref >90)
Glucose, Bld: 131 mg/dL — ABNORMAL HIGH (ref 70–99)
Total Bilirubin: 1 mg/dL (ref 0.3–1.2)
Total Protein: 7.7 g/dL (ref 6.0–8.3)

## 2013-10-17 LAB — APTT: aPTT: 31 seconds (ref 24–37)

## 2013-10-17 MED ORDER — ONDANSETRON HCL 4 MG/2ML IJ SOLN
4.0000 mg | Freq: Four times a day (QID) | INTRAMUSCULAR | Status: DC | PRN
  Administered 2013-10-18 – 2013-10-20 (×3): 4 mg via INTRAVENOUS
  Filled 2013-10-17 (×3): qty 2

## 2013-10-17 MED ORDER — BENZONATATE 100 MG PO CAPS
100.0000 mg | ORAL_CAPSULE | Freq: Two times a day (BID) | ORAL | Status: DC | PRN
  Administered 2013-10-17 – 2013-10-21 (×5): 100 mg via ORAL
  Filled 2013-10-17 (×6): qty 1

## 2013-10-17 MED ORDER — AMLODIPINE BESYLATE 10 MG PO TABS
10.0000 mg | ORAL_TABLET | Freq: Every day | ORAL | Status: DC
  Administered 2013-10-17 – 2013-10-23 (×7): 10 mg via ORAL
  Filled 2013-10-17 (×8): qty 1

## 2013-10-17 MED ORDER — SODIUM CHLORIDE 0.9 % IV SOLN
1.0000 g | INTRAVENOUS | Status: DC
  Administered 2013-10-17 – 2013-10-22 (×7): 1 g via INTRAVENOUS
  Filled 2013-10-17 (×8): qty 1

## 2013-10-17 MED ORDER — IOHEXOL 300 MG/ML  SOLN
40.0000 mL | Freq: Once | INTRAMUSCULAR | Status: AC | PRN
  Administered 2013-10-17: 40 mL via ORAL

## 2013-10-17 MED ORDER — SODIUM CHLORIDE 0.9 % IV SOLN
INTRAVENOUS | Status: DC
  Administered 2013-10-17 – 2013-10-19 (×4): via INTRAVENOUS

## 2013-10-17 MED ORDER — PANTOPRAZOLE SODIUM 40 MG IV SOLR
40.0000 mg | Freq: Every day | INTRAVENOUS | Status: DC
  Administered 2013-10-17: 40 mg via INTRAVENOUS
  Filled 2013-10-17 (×2): qty 40

## 2013-10-17 MED ORDER — MORPHINE SULFATE 2 MG/ML IJ SOLN
2.0000 mg | INTRAMUSCULAR | Status: DC | PRN
  Administered 2013-10-18: 2 mg via INTRAVENOUS
  Filled 2013-10-17: qty 1

## 2013-10-17 MED ORDER — IOHEXOL 300 MG/ML  SOLN
125.0000 mL | Freq: Once | INTRAMUSCULAR | Status: AC | PRN
  Administered 2013-10-17: 125 mL via INTRAVENOUS

## 2013-10-17 MED ORDER — POTASSIUM CHLORIDE 10 MEQ/100ML IV SOLN
10.0000 meq | INTRAVENOUS | Status: AC
  Administered 2013-10-17 – 2013-10-18 (×4): 10 meq via INTRAVENOUS
  Filled 2013-10-17 (×4): qty 100

## 2013-10-17 NOTE — ED Notes (Signed)
Pt sent from PCP for appendicitis; pt sts RLQ pain x 2 days

## 2013-10-17 NOTE — ED Notes (Signed)
Family at bedside. 

## 2013-10-17 NOTE — ED Provider Notes (Signed)
CSN: [id number]     Arrival date & time 10/17/13  1613 History   First MD Initiated Contact with Patient 10/17/13 1704     Chief Complaint  Patient presents with  . Abdominal Pain   (Consider location/radiation/quality/duration/timing/severity/associated sxs/prior Treatment) Patient is a 77 y.o. male presenting with abdominal pain. The history is provided by the patient.  Abdominal Pain Pain location:  RLQ Pain quality: sharp   Pain radiates to:  Does not radiate Pain severity:  Mild Onset quality:  Gradual Duration:  2 days Timing:  Constant Progression:  Worsening Chronicity:  New Relieved by:  Nothing Associated symptoms: constipation, nausea and vomiting   Associated symptoms: no chest pain, no chills, no cough, no dysuria, no fever, no shortness of breath and no sore throat   Risk factors: being elderly     Past Medical History  Diagnosis Date  . Hypertension   . Diabetes mellitus without complication   . Stroke   . Hypercholesteremia    History reviewed. No pertinent past surgical history. History reviewed. No pertinent family history. History  Substance Use Topics  . Smoking status: Never Smoker   . Smokeless tobacco: Not on file  . Alcohol Use: No    Review of Systems  Constitutional: Negative for fever and chills.  HENT: Negative for sore throat.   Eyes: Negative for pain.  Respiratory: Negative for cough and shortness of breath.   Cardiovascular: Negative for chest pain.  Gastrointestinal: Positive for nausea, vomiting, abdominal pain and constipation.  Genitourinary: Negative for dysuria and flank pain.  Musculoskeletal: Negative for back pain and neck pain.  Skin: Negative for rash.  Neurological: Negative for seizures and headaches.    Allergies  Sulfur  Home Medications   No current outpatient prescriptions on file. BP 161/69  Pulse 74  Temp(Src) 100.9 F (38.3 C) (Oral)  Resp 18  Ht 5\' 8"  (1.727 m)  Wt 235 lb 7.2 oz (106.8 kg)  BMI  35.81 kg/m2  SpO2 96% Physical Exam  Constitutional: He is oriented to person, place, and time. He appears well-developed and well-nourished. No distress.  HENT:  Head: Normocephalic and atraumatic.  Eyes: Pupils are equal, round, and reactive to light.  Neck: Normal range of motion.  Cardiovascular: Normal rate and regular rhythm.   Pulmonary/Chest: Effort normal and breath sounds normal.  Abdominal: Soft. He exhibits no distension. There is tenderness in the right lower quadrant. There is tenderness at McBurney's point.  Musculoskeletal: Normal range of motion.  Neurological: He is alert and oriented to person, place, and time.  Skin: Skin is warm. He is not diaphoretic.    ED Course  Procedures (including critical care time) Labs Review Labs Reviewed  CBC WITH DIFFERENTIAL - Abnormal; Notable for the following:    WBC 12.5 (*)    Neutrophils Relative % 80 (*)    Neutro Abs 9.9 (*)    All other components within normal limits  COMPREHENSIVE METABOLIC PANEL - Abnormal; Notable for the following:    Sodium 134 (*)    Potassium 3.2 (*)    Chloride 93 (*)    Glucose, Bld 131 (*)    GFR calc non Af Amer 78 (*)    All other components within normal limits  APTT  PROTIME-INR  CBC   Imaging Review Dg Chest 2 View  10/17/2013   CLINICAL DATA:  Preop for appendectomy.  Cough.  Hypertension.  EXAM: CHEST  2 VIEW  COMPARISON:  07/23/2009.  FINDINGS: Stable granuloma in  the right upper lobe. Lungs are mildly hyperexpanded but otherwise clear. No pleural effusion or pneumothorax.  Cardiac silhouette is normal in size. Normal mediastinal and hilar contours.  Multiple old right-sided rib fractures, also stable.  IMPRESSION: No acute cardiopulmonary disease.   Electronically Signed   By: Amie Portland M.D.   On: 10/17/2013 17:20   Ct Abdomen Pelvis W Contrast  10/17/2013   CLINICAL DATA:  Right lower quadrant pain and fever.  EXAM: CT ABDOMEN AND PELVIS WITH CONTRAST  TECHNIQUE:  Multidetector CT imaging of the abdomen and pelvis was performed using the standard protocol following bolus administration of intravenous contrast.  CONTRAST:  40mL OMNIPAQUE IOHEXOL 300 MG/ML SOLN, OMNIPAQUE IOHEXOL 300 MG/ML SOLN  COMPARISON:  CT scan abdomen dated 07/17/2009  FINDINGS: The patient has acute appendicitis. The appendix is swollen and there is prominent soft tissue stranding around the appendix. There is secondary edema in the mucosa of the tip of the cecum and in the terminal ileum. There is no diverticular abscess. No free air in the abdomen.  Liver, biliary tree, spleen, pancreas, adrenal glands, and kidneys demonstrate no significant abnormality. Benign appearing 2.6 cm cyst on the upper pole of the left kidney.  There are few diverticula in the distal colon. No significant osseous abnormality.  IMPRESSION: Acute appendicitis. Adjacent inflammation of the tip of the cecum and terminal ileum.   Electronically Signed   By: Geanie Cooley M.D.   On: 10/17/2013 15:49    EKG Interpretation    Date/Time:    Ventricular Rate:    PR Interval:    QRS Duration:   QT Interval:    QTC Calculation:   R Axis:     Text Interpretation:              MDM   1. Appendicitis    77 yo M with hx of HTN, DM, stroke, HLD, presents with CT evidenced appendicitis.   Patient with 2 day history of abdominal pain, migrating to RLQ, with associated N/V. Labs as above, with mild leukocytosis. CT demonstrates acute appy without perforation. Gen surg consulted for admission, patient admitted to floor in stable condition. Hemodynamically stable in ED. Patient seen and evaluated by myself and my attending, Dr. Romeo Apple.      Imagene Sheller, MD 10/17/13 [phone]

## 2013-10-17 NOTE — ED Notes (Signed)
DR WAKEFIELD AT BEDSIDE DISCUSSING PLAN OF CARE WITH PT

## 2013-10-17 NOTE — H&P (Signed)
Jeff Watkins is an 77 y.o. male.   Chief Complaint: abdominal pain HPI: 63 yom who has history diabetes (not on meds) and prior strokes (on plavix which he missed yesterday but took this am) who presents with about 30 hours of abdominal pain.  This started generalized but now has localized to the rlq.  He denies fevers.  Has had some nausea and no emesis.  Having small bms.  He has history of colonoscopy but think this year he was due for repeat.  He underwent ct today which showed appendicitis.  I was consulted after that. He also complaints of dry cough and asks for abx.  Has history of this treated with z pack with Dr Earl Gala but says last time was thought to be allergies more than that Past Medical History  Diagnosis Date  . Hypertension   . Diabetes mellitus without complication   . Stroke   . Hypercholesteremia     PSH: inguinal hernia repair, undescended testicle, right arm surgery with multiple bone grafts  History reviewed. No pertinent family history. Social History:  reports that he has never smoked. He does not have any smokeless tobacco history on file. He reports that he does not drink alcohol or use illicit drugs.  Allergies:  Allergies  Allergen Reactions  . Sulfur Hives    In genital area accompanied by blisters  ? Pcn  Meds reviewed, include plavix, lipitor, norvasc, hyzaar, protonix, mvi, fish oil, zantac   Results for orders placed during the hospital encounter of 10/17/13 (from the past 48 hour(s))  CBC WITH DIFFERENTIAL     Status: Abnormal   Collection Time    10/17/13  5:51 PM      Result Value Range   WBC 12.5 (*) 4.0 - 10.5 K/uL   RBC 4.66  4.22 - 5.81 MIL/uL   Hemoglobin 14.7  13.0 - 17.0 g/dL   HCT 11.9  14.7 - 82.9 %   MCV 91.8  78.0 - 100.0 fL   MCH 31.5  26.0 - 34.0 pg   MCHC 34.3  30.0 - 36.0 g/dL   RDW 56.2  13.0 - 86.5 %   Platelets 175  150 - 400 K/uL   Neutrophils Relative % 80 (*) 43 - 77 %   Neutro Abs 9.9 (*) 1.7 - 7.7 K/uL    Lymphocytes Relative 14  12 - 46 %   Lymphs Abs 1.7  0.7 - 4.0 K/uL   Monocytes Relative 7  3 - 12 %   Monocytes Absolute 0.8  0.1 - 1.0 K/uL   Eosinophils Relative 0  0 - 5 %   Eosinophils Absolute 0.0  0.0 - 0.7 K/uL   Basophils Relative 0  0 - 1 %   Basophils Absolute 0.0  0.0 - 0.1 K/uL  COMPREHENSIVE METABOLIC PANEL     Status: Abnormal   Collection Time    10/17/13  5:51 PM      Result Value Range   Sodium 134 (*) 135 - 145 mEq/L   Potassium 3.2 (*) 3.5 - 5.1 mEq/L   Chloride 93 (*) 96 - 112 mEq/L   CO2 27  19 - 32 mEq/L   Glucose, Bld 131 (*) 70 - 99 mg/dL   BUN 15  6 - 23 mg/dL   Creatinine, Ser 7.84  0.50 - 1.35 mg/dL   Calcium 9.0  8.4 - 69.6 mg/dL   Total Protein 7.7  6.0 - 8.3 g/dL   Albumin 3.9  3.5 -  5.2 g/dL   AST 21  0 - 37 U/L   ALT 9  0 - 53 U/L   Alkaline Phosphatase 52  39 - 117 U/L   Total Bilirubin 1.0  0.3 - 1.2 mg/dL   GFR calc non Af Amer 78 (*) >90 mL/min   GFR calc Af Amer >90  >90 mL/min   Comment: (NOTE)     The eGFR has been calculated using the CKD EPI equation.     This calculation has not been validated in all clinical situations.     eGFR's persistently <90 mL/min signify possible Chronic Kidney     Disease.  APTT     Status: None   Collection Time    10/17/13  5:51 PM      Result Value Range   aPTT 31  24 - 37 seconds  PROTIME-INR     Status: None   Collection Time    10/17/13  5:51 PM      Result Value Range   Prothrombin Time 13.6  11.6 - 15.2 seconds   INR 1.06  0.00 - 1.49   Dg Chest 2 View  10/17/2013   CLINICAL DATA:  Preop for appendectomy.  Cough.  Hypertension.  EXAM: CHEST  2 VIEW  COMPARISON:  07/23/2009.  FINDINGS: Stable granuloma in the right upper lobe. Lungs are mildly hyperexpanded but otherwise clear. No pleural effusion or pneumothorax.  Cardiac silhouette is normal in size. Normal mediastinal and hilar contours.  Multiple old right-sided rib fractures, also stable.  IMPRESSION: No acute cardiopulmonary disease.    Electronically Signed   By: Amie Portland M.D.   On: 10/17/2013 17:20   Ct Abdomen Pelvis W Contrast  10/17/2013   CLINICAL DATA:  Right lower quadrant pain and fever.  EXAM: CT ABDOMEN AND PELVIS WITH CONTRAST  TECHNIQUE: Multidetector CT imaging of the abdomen and pelvis was performed using the standard protocol following bolus administration of intravenous contrast.  CONTRAST:  40mL OMNIPAQUE IOHEXOL 300 MG/ML SOLN, OMNIPAQUE IOHEXOL 300 MG/ML SOLN  COMPARISON:  CT scan abdomen dated 07/17/2009  FINDINGS: The patient has acute appendicitis. The appendix is swollen and there is prominent soft tissue stranding around the appendix. There is secondary edema in the mucosa of the tip of the cecum and in the terminal ileum. There is no diverticular abscess. No free air in the abdomen.  Liver, biliary tree, spleen, pancreas, adrenal glands, and kidneys demonstrate no significant abnormality. Benign appearing 2.6 cm cyst on the upper pole of the left kidney.  There are few diverticula in the distal colon. No significant osseous abnormality.  IMPRESSION: Acute appendicitis. Adjacent inflammation of the tip of the cecum and terminal ileum.   Electronically Signed   By: Geanie Cooley M.D.   On: 10/17/2013 15:49    Review of Systems  Constitutional: Negative for fever and chills.  Respiratory: Positive for cough. Negative for shortness of breath.   Cardiovascular: Negative for chest pain.  Gastrointestinal: Positive for nausea, abdominal pain and constipation. Negative for vomiting.    Blood pressure 135/57, pulse 74, temperature 99.8 F (37.7 C), temperature source Oral, resp. rate 18, SpO2 97.00%. Physical Exam  Vitals reviewed. Constitutional: He appears well-developed and well-nourished.  Eyes: No scleral icterus.  Neck: Neck supple.  Cardiovascular: Normal rate, regular rhythm and normal heart sounds.   Respiratory: Effort normal and breath sounds normal. He has no wheezes. He has no rales.   GI: Soft. He exhibits no distension. Bowel sounds are  decreased. There is tenderness in the right lower quadrant. No hernia.  Lymphadenopathy:    He has no cervical adenopathy.     Assessment/Plan Appendicitis  We discussed standard treatment to be laparoscopic appendectomy now but plavix complicates that.  I think would be best to delay surgery at least until later tomorrow or possibly the next day even.  I think risk of bleeding is more than anything else right now. He is not toxic or in need of urgent operation and with data on abx for appendicitis I think reasonable to place on abx and delay surgery. If he worsens or becomes ill then we can proceed earlier.  I will also check cxr given his cough and concern also.    Aideen Fenster 10/17/2013, 7:20 PM

## 2013-10-18 ENCOUNTER — Observation Stay (HOSPITAL_COMMUNITY): Payer: Medicare Other | Admitting: Critical Care Medicine

## 2013-10-18 ENCOUNTER — Encounter (HOSPITAL_COMMUNITY): Payer: Self-pay | Admitting: Critical Care Medicine

## 2013-10-18 ENCOUNTER — Encounter (HOSPITAL_COMMUNITY): Payer: Medicare Other | Admitting: Critical Care Medicine

## 2013-10-18 ENCOUNTER — Encounter (HOSPITAL_COMMUNITY): Admission: EM | Disposition: A | Discharge status: Home / Self Care | Payer: Self-pay | Source: Home / Self Care

## 2013-10-18 HISTORY — PX: LAPAROSCOPIC APPENDECTOMY: SHX408

## 2013-10-18 LAB — CBC
Hemoglobin: 13 g/dL (ref 13.0–17.0)
MCH: 31.1 pg (ref 26.0–34.0)
MCHC: 34.1 g/dL (ref 30.0–36.0)
MCV: 91.1 fL (ref 78.0–100.0)
RBC: 4.18 MIL/uL — ABNORMAL LOW (ref 4.22–5.81)
RDW: 13.4 % (ref 11.5–15.5)

## 2013-10-18 LAB — PLATELET INHIBITION P2Y12: Platelet Function  P2Y12: 271 [PRU] (ref 194–418)

## 2013-10-18 SURGERY — APPENDECTOMY, LAPAROSCOPIC
Anesthesia: General | Site: Abdomen

## 2013-10-18 MED ORDER — LIDOCAINE HCL (CARDIAC) 20 MG/ML IV SOLN
INTRAVENOUS | Status: DC | PRN
  Administered 2013-10-18: 100 mg via INTRAVENOUS

## 2013-10-18 MED ORDER — FENTANYL CITRATE 0.05 MG/ML IJ SOLN
INTRAMUSCULAR | Status: DC | PRN
  Administered 2013-10-18 (×2): 50 ug via INTRAVENOUS
  Administered 2013-10-18: 150 ug via INTRAVENOUS

## 2013-10-18 MED ORDER — PROMETHAZINE HCL 25 MG/ML IJ SOLN: 6.2500 mg | INTRAMUSCULAR | Status: DC | PRN

## 2013-10-18 MED ORDER — OXYCODONE HCL 5 MG/5ML PO SOLN: 5.0000 mg | Freq: Once | ORAL | Status: AC | PRN

## 2013-10-18 MED ORDER — ROCURONIUM BROMIDE 100 MG/10ML IV SOLN
INTRAVENOUS | Status: DC | PRN
  Administered 2013-10-18: 10 mg via INTRAVENOUS
  Administered 2013-10-18: 20 mg via INTRAVENOUS

## 2013-10-18 MED ORDER — LORAZEPAM 2 MG/ML IJ SOLN: 1.0000 mg | Freq: Once | INTRAMUSCULAR | Status: DC

## 2013-10-18 MED ORDER — SODIUM CHLORIDE 0.9 % IV SOLN
1.0000 g | INTRAVENOUS | Status: DC
  Filled 2013-10-18: qty 1

## 2013-10-18 MED ORDER — LACTATED RINGERS IV SOLN
INTRAVENOUS | Status: DC | PRN
  Administered 2013-10-18 (×2): via INTRAVENOUS

## 2013-10-18 MED ORDER — NEOSTIGMINE METHYLSULFATE 1 MG/ML IJ SOLN
INTRAMUSCULAR | Status: DC | PRN
  Administered 2013-10-18: 5 mg via INTRAVENOUS

## 2013-10-18 MED ORDER — BUPIVACAINE-EPINEPHRINE (PF) 0.25% -1:200000 IJ SOLN
INTRAMUSCULAR | Status: AC
  Filled 2013-10-18: qty 30

## 2013-10-18 MED ORDER — SUCCINYLCHOLINE CHLORIDE 20 MG/ML IJ SOLN
INTRAMUSCULAR | Status: DC | PRN
  Administered 2013-10-18: 140 mg via INTRAVENOUS

## 2013-10-18 MED ORDER — BUPIVACAINE-EPINEPHRINE 0.25% -1:200000 IJ SOLN
INTRAMUSCULAR | Status: DC | PRN
  Administered 2013-10-18: 30 mL

## 2013-10-18 MED ORDER — GLYCOPYRROLATE 0.2 MG/ML IJ SOLN
INTRAMUSCULAR | Status: DC | PRN
  Administered 2013-10-18: 0.1 mg via INTRAVENOUS
  Administered 2013-10-18: .8 mg via INTRAVENOUS

## 2013-10-18 MED ORDER — MIDAZOLAM HCL 5 MG/5ML IJ SOLN
INTRAMUSCULAR | Status: DC | PRN
  Administered 2013-10-18: 2 mg via INTRAVENOUS

## 2013-10-18 MED ORDER — OXYCODONE HCL 5 MG PO TABS
ORAL_TABLET | ORAL | Status: AC
  Filled 2013-10-18: qty 1

## 2013-10-18 MED ORDER — OXYCODONE HCL 5 MG PO TABS
5.0000 mg | ORAL_TABLET | Freq: Once | ORAL | Status: AC | PRN
  Administered 2013-10-18: 5 mg via ORAL

## 2013-10-18 MED ORDER — HYDROMORPHONE HCL PF 1 MG/ML IJ SOLN
0.5000 mg | INTRAMUSCULAR | Status: DC | PRN
  Administered 2013-10-18 – 2013-10-19 (×2): 0.5 mg via INTRAVENOUS
  Filled 2013-10-18 (×2): qty 1

## 2013-10-18 MED ORDER — PROPOFOL 10 MG/ML IV BOLUS
INTRAVENOUS | Status: DC | PRN
  Administered 2013-10-18: 200 mg via INTRAVENOUS

## 2013-10-18 MED ORDER — ONDANSETRON HCL 4 MG/2ML IJ SOLN
INTRAMUSCULAR | Status: DC | PRN
  Administered 2013-10-18: 4 mg via INTRAVENOUS

## 2013-10-18 MED ORDER — OXYCODONE-ACETAMINOPHEN 5-325 MG PO TABS: 1.0000 | ORAL_TABLET | ORAL | Status: DC | PRN

## 2013-10-18 MED ORDER — HYDROMORPHONE HCL PF 1 MG/ML IJ SOLN: 0.2500 mg | INTRAMUSCULAR | Status: DC | PRN

## 2013-10-18 MED ORDER — SODIUM CHLORIDE 0.9 % IR SOLN
Status: DC | PRN
  Administered 2013-10-18: 1000 mL

## 2013-10-18 SURGICAL SUPPLY — 51 items
ADH SKN CLS APL DERMABOND .7 (GAUZE/BANDAGES/DRESSINGS) ×1
ADH SKN CLS LQ APL DERMABOND (GAUZE/BANDAGES/DRESSINGS) ×1
APPLIER CLIP ROT 10 11.4 M/L (STAPLE)
APR CLP MED LRG 11.4X10 (STAPLE)
BAG SPEC RTRVL LRG 6X4 10 (ENDOMECHANICALS) ×1
BLADE SURG ROTATE 9660 (MISCELLANEOUS) IMPLANT
CANISTER SUCTION 2500CC (MISCELLANEOUS) ×2 IMPLANT
CHLORAPREP W/TINT 26ML (MISCELLANEOUS) ×2 IMPLANT
CLIP APPLIE ROT 10 11.4 M/L (STAPLE) IMPLANT
COVER SURGICAL LIGHT HANDLE (MISCELLANEOUS) ×2 IMPLANT
CUTTER LINEAR ENDO 35 ETS (STAPLE) ×1 IMPLANT
CUTTER LINEAR ENDO 35 ETS TH (STAPLE) IMPLANT
DECANTER SPIKE VIAL GLASS SM (MISCELLANEOUS) ×2 IMPLANT
DERMABOND ADHESIVE PROPEN (GAUZE/BANDAGES/DRESSINGS) ×1
DERMABOND ADVANCED (GAUZE/BANDAGES/DRESSINGS) ×1
DERMABOND ADVANCED .7 DNX12 (GAUZE/BANDAGES/DRESSINGS) ×1 IMPLANT
DERMABOND ADVANCED .7 DNX6 (GAUZE/BANDAGES/DRESSINGS) IMPLANT
DRAPE UTILITY 15X26 W/TAPE STR (DRAPE) ×4 IMPLANT
ELECT REM PT RETURN 9FT ADLT (ELECTROSURGICAL) ×2
ELECTRODE REM PT RTRN 9FT ADLT (ELECTROSURGICAL) ×1 IMPLANT
ENDOLOOP SUT PDS II  0 18 (SUTURE)
ENDOLOOP SUT PDS II 0 18 (SUTURE) IMPLANT
GLOVE BIO SURGEON STRL SZ7.5 (GLOVE) ×1 IMPLANT
GLOVE BIOGEL PI IND STRL 7.5 (GLOVE) IMPLANT
GLOVE BIOGEL PI IND STRL 8 (GLOVE) ×1 IMPLANT
GLOVE BIOGEL PI INDICATOR 7.5 (GLOVE) ×1
GLOVE BIOGEL PI INDICATOR 8 (GLOVE) ×1
GLOVE ECLIPSE 7.5 STRL STRAW (GLOVE) ×2 IMPLANT
GLOVE SKINSENSE NS SZ7.0 (GLOVE) ×1
GLOVE SKINSENSE STRL SZ7.0 (GLOVE) IMPLANT
GOWN BRE IMP SLV AUR XL STRL (GOWN DISPOSABLE) ×1 IMPLANT
GOWN STRL NON-REIN LRG LVL3 (GOWN DISPOSABLE) ×4 IMPLANT
KIT BASIN OR (CUSTOM PROCEDURE TRAY) ×2 IMPLANT
KIT ROOM TURNOVER OR (KITS) ×2 IMPLANT
NS IRRIG 1000ML POUR BTL (IV SOLUTION) ×2 IMPLANT
PAD ARMBOARD 7.5X6 YLW CONV (MISCELLANEOUS) ×4 IMPLANT
PENCIL BUTTON HOLSTER BLD 10FT (ELECTRODE) IMPLANT
POUCH SPECIMEN RETRIEVAL 10MM (ENDOMECHANICALS) ×2 IMPLANT
RELOAD /EVU35 (ENDOMECHANICALS) ×1 IMPLANT
RELOAD CUTTER ETS 35MM STAND (ENDOMECHANICALS) IMPLANT
SET IRRIG TUBING LAPAROSCOPIC (IRRIGATION / IRRIGATOR) ×2 IMPLANT
SPECIMEN JAR SMALL (MISCELLANEOUS) ×2 IMPLANT
SUT MNCRL AB 4-0 PS2 18 (SUTURE) ×2 IMPLANT
TOWEL OR 17X24 6PK STRL BLUE (TOWEL DISPOSABLE) ×2 IMPLANT
TOWEL OR 17X26 10 PK STRL BLUE (TOWEL DISPOSABLE) ×2 IMPLANT
TRAY FOLEY CATH 16FR SILVER (SET/KITS/TRAYS/PACK) ×2 IMPLANT
TRAY LAPAROSCOPIC (CUSTOM PROCEDURE TRAY) ×2 IMPLANT
TROCAR XCEL 12X100 BLDLESS (ENDOMECHANICALS) ×2 IMPLANT
TROCAR XCEL BLUNT TIP 100MML (ENDOMECHANICALS) ×2 IMPLANT
TROCAR XCEL NON-BLD 5MMX100MML (ENDOMECHANICALS) ×2 IMPLANT
WATER STERILE IRR 1000ML POUR (IV SOLUTION) IMPLANT

## 2013-10-18 NOTE — Progress Notes (Signed)
Subjective: He is still sore in the RLQ, no acute pain.    Objective: Vital signs in last 24 hours: Temp:  [98.6 F (37 C)-100.9 F (38.3 C)] 98.6 F (37 C) (12/23 0559) Pulse Rate:  [72-82] 82 (12/23 0559) Resp:  [18-20] 20 (12/23 0559) BP: (135-175)/(55-70) 136/58 mmHg (12/23 0559) SpO2:  [96 %-97 %] 96 % (12/23 0559) Weight:  [106.8 kg (235 lb 7.2 oz)] 106.8 kg (235 lb 7.2 oz) (12/22 2140)  NPO Tm 100.9, VSS WBC 9.2 this AM  Intake/Output from previous day:   Intake/Output this shift:    General appearance: alert, cooperative and no distress Resp: clear to auscultation bilaterally GI: tender RLQ, obses, +BS, no distension.  Lab Results:   Recent Labs  10/17/13 1751 10/18/13 0555  WBC 12.5* 9.2  HGB 14.7 13.0  HCT 42.8 38.1*  PLT 175 172    BMET  Recent Labs  10/17/13 1751  NA 134*  K 3.2*  CL 93*  CO2 27  GLUCOSE 131*  BUN 15  CREATININE 0.98  CALCIUM 9.0   PT/INR  Recent Labs  10/17/13 1751  LABPROT 13.6  INR 1.06     Recent Labs Lab 10/17/13 1751  AST 21  ALT 9  ALKPHOS 52  BILITOT 1.0  PROT 7.7  ALBUMIN 3.9     Lipase     Component Value Date/Time   LIPASE 43 07/23/2009 0636     Studies/Results: Dg Chest 2 View  10/17/2013   CLINICAL DATA:  Preop for appendectomy.  Cough.  Hypertension.  EXAM: CHEST  2 VIEW  COMPARISON:  07/23/2009.  FINDINGS: Stable granuloma in the right upper lobe. Lungs are mildly hyperexpanded but otherwise clear. No pleural effusion or pneumothorax.  Cardiac silhouette is normal in size. Normal mediastinal and hilar contours.  Multiple old right-sided rib fractures, also stable.  IMPRESSION: No acute cardiopulmonary disease.   Electronically Signed   By: Amie Portland M.D.   On: 10/17/2013 17:20   Ct Abdomen Pelvis W Contrast  10/17/2013   CLINICAL DATA:  Right lower quadrant pain and fever.  EXAM: CT ABDOMEN AND PELVIS WITH CONTRAST  TECHNIQUE: Multidetector CT imaging of the abdomen and pelvis was  performed using the standard protocol following bolus administration of intravenous contrast.  CONTRAST:  40mL OMNIPAQUE IOHEXOL 300 MG/ML SOLN, OMNIPAQUE IOHEXOL 300 MG/ML SOLN  COMPARISON:  CT scan abdomen dated 07/17/2009  FINDINGS: The patient has acute appendicitis. The appendix is swollen and there is prominent soft tissue stranding around the appendix. There is secondary edema in the mucosa of the tip of the cecum and in the terminal ileum. There is no diverticular abscess. No free air in the abdomen.  Liver, biliary tree, spleen, pancreas, adrenal glands, and kidneys demonstrate no significant abnormality. Benign appearing 2.6 cm cyst on the upper pole of the left kidney.  There are few diverticula in the distal colon. No significant osseous abnormality.  IMPRESSION: Acute appendicitis. Adjacent inflammation of the tip of the cecum and terminal ileum.   Electronically Signed   By: Geanie Cooley M.D.   On: 10/17/2013 15:49    Medications: . amLODipine  10 mg Oral Daily  . ertapenem (INVANZ) IV  1 g Intravenous Q24H  . LORazepam  1 mg Intravenous Once  . pantoprazole (PROTONIX) IV  40 mg Intravenous QHS   . sodium chloride 100 mL/hr at 10/17/13 2251   Prior to Admission medications   Medication Sig Start Date End Date Taking? Authorizing Provider  amLODipine (NORVASC) 10 MG tablet Take 10 mg by mouth daily.   Yes Historical Provider, MD  atorvastatin (LIPITOR) 40 MG tablet Take 40 mg by mouth daily.   Yes Historical Provider, MD  clopidogrel (PLAVIX) 75 MG tablet Take 75 mg by mouth daily with breakfast.   Yes Historical Provider, MD  ferrous sulfate 325 (65 FE) MG tablet Take 325 mg by mouth daily with breakfast.   Yes Historical Provider, MD  Fish Oil-Cholecalciferol (FISH OIL + D3 PO) Take 1 capsule by mouth daily.   Yes Historical Provider, MD  loratadine (CLARITIN) 10 MG tablet Take 10 mg by mouth daily.   Yes Historical Provider, MD  losartan-hydrochlorothiazide (HYZAAR) 50-12.5  MG per tablet Take 1 tablet by mouth daily.   Yes Historical Provider, MD  Multiple Vitamin (MULTI VITAMIN MENS PO) Take 1 tablet by mouth daily.   Yes Historical Provider, MD  pantoprazole (PROTONIX) 20 MG tablet Take 20 mg by mouth daily.   Yes Historical Provider, MD      Assessment/Plan Appendicitis Hypertension AODM Stroke on plavix Hypercholesteremia GERD  Plan:  Dr. Lindie Spruce will plan to do later today. Discussed with patient and he is agreeable.   LOS: 1 day    Malone Admire 10/18/2013

## 2013-10-18 NOTE — ED Provider Notes (Signed)
Medical screening examination/treatment/procedure(s) were conducted as a shared visit with resident physician and myself.  I personally evaluated the patient during the encounter.   EKG Interpretation    Date/Time:  Monday October 17 2013 18:37:09 EST Ventricular Rate:  73 PR Interval:  135 QRS Duration: 109 QT Interval:  392 QTC Calculation: 432 R Axis:   69 Text Interpretation:  Sinus rhythm No significant change since last tracing Confirmed by Umer Harig  MD, Jamari Diana ([id number]) on 10/18/2013 12:09:30 AM           I interviewed and examined the patient. Lungs are CTAB. Cardiac exam wnl. Abdomen soft.  Pt here w/ CT confirmed appendicitis. [facility] consulted and will admit.    Junius Argyle, MD 10/18/13 [phone]

## 2013-10-18 NOTE — Progress Notes (Signed)
UR completed 

## 2013-10-18 NOTE — Op Note (Signed)
OPERATIVE REPORT  DATE OF OPERATION: 10/17/2013 - 10/18/2013  PATIENT:  Jeff Watkins  77 y.o. male  PRE-OPERATIVE DIAGNOSIS:  acute appendicitis  POST-OPERATIVE DIAGNOSIS:  Acute appendicitis with rupture  PROCEDURE:  Procedure(s): APPENDECTOMY LAPAROSCOPIC  SURGEON:  Surgeon(s): [name], MD  ASSISTANT: None  ANESTHESIA:   general  EBL: <20 ml  BLOOD ADMINISTERED: none  DRAINS: none   SPECIMEN:  Source of Specimen:  Necrotic appendix  COUNTS CORRECT:  YES  PROCEDURE DETAILS: The patient was taken to the operating room and placed on the table in supine position. After an adequate general endotracheal anesthetic was administered he was prepped and draped in usual sterile manner exposing the entire abdomen.  After a proper time out was performed identifying the patient and the procedure to be performed, a supraumbilical midline incision was made using #15 blade and taken down to the midline fascia. We incised the fascia with #15 blade then subsequently bluntly dissected down into the peritoneal cavity using a Kelly clamp. Once this was done a pursestring suture of 0 Vicryl was passed around the fascial opening. This secured in place a Hassan cannula which we subsequently used to insufflate carbon dioxide gas up to a maximal intra-abdominal pressure 15 mm of mercury.  A right upper quadrant 5 mm cannula and the left lower quadrant 12 mm cannula were passed under direct vision. Immediately upon visualizing the right lower quadrant therappeared mass of the cecum and periappendiceal tissue was noted to the anterior abdominal wall on the right side. We were able to dissect out an abscess cavity associated with a ruptured appendix approximately 2 cm from the base of the cecum. We were able to mobilize the appendix at the base of the cecum to get an endoscopic Endo GIA across the base of the appendix with the blue 3.5 mm closure staples.  We subsequently dissected out the  mesoappendix and came across the base the mesoappendix using a 2.5 mm white staple cartridge. This completely detached appendix which was brought out through the left lower quadrant incision site using an Endo Catch bag. We subsequently irrigated the right lower quadrant with copious amounts saline solution.  Subsequently the supraumbilical fascial site was closed with a pursestring suture which was in place. We aspirated all fluid and gas from above the liver and then the pelvic cavity. We then closed. The skin was closed using running subcuticular stitch of 4-0 Monocryl. 0.25% Marcaine was used to injected in all sites a total of 19 cc. All needle counts, sponge counts, and instrument counts were correct.  PATIENT DISPOSITION:  PACU - hemodynamically stable.   [name] 12/23/20144:56 PM

## 2013-10-18 NOTE — Anesthesia Preprocedure Evaluation (Addendum)
Anesthesia Evaluation  Patient identified by MRN, date of birth, ID band Patient awake    Reviewed: Allergy & Precautions, H&P , NPO status , Patient's Chart, lab work & pertinent test results  History of Anesthesia Complications Negative for: history of anesthetic complications  Airway Mallampati: II TM Distance: >3 FB Neck ROM: Full    Dental  (+) Missing and Dental Advisory Given   Pulmonary neg pulmonary ROS,    Pulmonary exam normal       Cardiovascular hypertension,     Neuro/Psych CVA negative psych ROS   GI/Hepatic negative GI ROS, Neg liver ROS,   Endo/Other  diabetes  Renal/GU negative Renal ROS     Musculoskeletal   Abdominal   Peds  Hematology   Anesthesia Other Findings   Reproductive/Obstetrics                          Anesthesia Physical Anesthesia Plan  ASA: III  Anesthesia Plan: General   Post-op Pain Management:    Induction: Rapid sequence  Airway Management Planned: Oral ETT  Additional Equipment:   Intra-op Plan:   Post-operative Plan: Extubation in OR  Informed Consent: I have reviewed the patients History and Physical, chart, labs and discussed the procedure including the risks, benefits and alternatives for the proposed anesthesia with the patient or authorized representative who has indicated his/her understanding and acceptance.   Dental advisory given  Plan Discussed with: CRNA, Anesthesiologist and Surgeon  Anesthesia Plan Comments:        Anesthesia Quick Evaluation

## 2013-10-18 NOTE — Preoperative (Signed)
Beta Blockers   Reason not to administer Beta Blockers:Not Applicable 

## 2013-10-18 NOTE — Progress Notes (Signed)
Will plan on taking the patient to the OR later today.  Discussed with patient and his wife.  Marta Lamas. Gae Bon, MD, FACS [phone] [facility]

## 2013-10-18 NOTE — Transfer of Care (Signed)
Immediate Anesthesia Transfer of Care Note  Patient: Jeff Watkins  Procedure(s) Performed: Procedure(s): APPENDECTOMY LAPAROSCOPIC (N/A)  Patient Location: PACU  Anesthesia Type:General  Level of Consciousness: sedated  Airway & Oxygen Therapy: Patient Spontanous Breathing and Patient connected to nasal cannula oxygen  Post-op Assessment: Report given to PACU RN and Post -op Vital signs reviewed and stable  Post vital signs: stable  Complications: No apparent anesthesia complications

## 2013-10-19 ENCOUNTER — Encounter (HOSPITAL_COMMUNITY): Payer: Self-pay | Admitting: General Surgery

## 2013-10-19 MED ORDER — HYDROCODONE-ACETAMINOPHEN 5-325 MG PO TABS
1.0000 | ORAL_TABLET | ORAL | Status: DC | PRN
  Administered 2013-10-19: 2 via ORAL
  Administered 2013-10-20: 1 via ORAL
  Filled 2013-10-19: qty 2
  Filled 2013-10-19: qty 1

## 2013-10-19 MED ORDER — ALBUTEROL SULFATE (5 MG/ML) 0.5% IN NEBU
2.5000 mg | INHALATION_SOLUTION | Freq: Four times a day (QID) | RESPIRATORY_TRACT | Status: DC | PRN
  Administered 2013-10-19 (×2): 2.5 mg via RESPIRATORY_TRACT
  Filled 2013-10-19 (×3): qty 0.5

## 2013-10-19 MED ORDER — ALBUTEROL SULFATE (5 MG/ML) 0.5% IN NEBU
2.5000 mg | INHALATION_SOLUTION | Freq: Four times a day (QID) | RESPIRATORY_TRACT | Status: DC
  Administered 2013-10-20 – 2013-10-21 (×6): 2.5 mg via RESPIRATORY_TRACT
  Filled 2013-10-19 (×7): qty 0.5

## 2013-10-19 NOTE — Progress Notes (Signed)
Patient changed to IP r/t con't to require IVF and IV antibiotics

## 2013-10-19 NOTE — Anesthesia Postprocedure Evaluation (Signed)
Anesthesia Post Note  Patient: Jeff Watkins  Procedure(s) Performed: Procedure(s) (LRB): APPENDECTOMY LAPAROSCOPIC (N/A)  Anesthesia type: general  Patient location: PACU  Post pain: Pain level controlled  Post assessment: Patient's Cardiovascular Status Stable  Post vital signs: Reviewed and stable  Level of consciousness: sedated  Complications: No apparent anesthesia complications

## 2013-10-19 NOTE — Progress Notes (Signed)
Patient ID: Jeff Watkins, male   DOB: [birth year], 77 y.o.   MRN: [id number] 1 Day Post-Op  Subjective: Some pain in right lower quadrant but better than before surgery. No nausea after liquid diet.  Objective: Vital signs in last 24 hours: Temp:  [97.7 F (36.5 C)-99.9 F (37.7 C)] 99.9 F (37.7 C) (12/24 0627) Pulse Rate:  [65-80] 71 (12/24 0627) Resp:  [12-20] 16 (12/24 0627) BP: (136-161)/(47-66) 140/52 mmHg (12/24 0627) SpO2:  [91 %-99 %] 91 % (12/24 0627) Last BM Date: 10/15/13  Intake/Output from previous day: 12/23 0701 - 12/24 0700 In: 4495.3 [P.O.:160; I.V.:4185.3; IV Piggyback:150] Out: 350 [Urine:300; Blood:50] Intake/Output this shift:    General appearance: alert, cooperative, no distress and moderately obese Resp: wheezes bilaterally and mild without increased work of breathing GI: abnormal findings:  moderate tenderness in the RLQ Incision/Wound: faint erythema around the laparoscopic incisions  Lab Results:   Recent Labs  10/17/13 1751 10/18/13 0555  WBC 12.5* 9.2  HGB 14.7 13.0  HCT 42.8 38.1*  PLT 175 172   BMET  Recent Labs  10/17/13 1751  NA 134*  K 3.2*  CL 93*  CO2 27  GLUCOSE 131*  BUN 15  CREATININE 0.98  CALCIUM 9.0     Studies/Results: Dg Chest 2 View  10/17/2013   CLINICAL DATA:  Preop for appendectomy.  Cough.  Hypertension.  EXAM: CHEST  2 VIEW  COMPARISON:  07/23/2009.  FINDINGS: Stable granuloma in the right upper lobe. Lungs are mildly hyperexpanded but otherwise clear. No pleural effusion or pneumothorax.  Cardiac silhouette is normal in size. Normal mediastinal and hilar contours.  Multiple old right-sided rib fractures, also stable.  IMPRESSION: No acute cardiopulmonary disease.   Electronically Signed   By: Amie Portland M.D.   On: 10/17/2013 17:20   Ct Abdomen Pelvis W Contrast  10/17/2013   CLINICAL DATA:  Right lower quadrant pain and fever.  EXAM: CT ABDOMEN AND PELVIS WITH CONTRAST  TECHNIQUE: Multidetector CT  imaging of the abdomen and pelvis was performed using the standard protocol following bolus administration of intravenous contrast.  CONTRAST:  40mL OMNIPAQUE IOHEXOL 300 MG/ML SOLN, OMNIPAQUE IOHEXOL 300 MG/ML SOLN  COMPARISON:  CT scan abdomen dated 07/17/2009  FINDINGS: The patient has acute appendicitis. The appendix is swollen and there is prominent soft tissue stranding around the appendix. There is secondary edema in the mucosa of the tip of the cecum and in the terminal ileum. There is no diverticular abscess. No free air in the abdomen.  Liver, biliary tree, spleen, pancreas, adrenal glands, and kidneys demonstrate no significant abnormality. Benign appearing 2.6 cm cyst on the upper pole of the left kidney.  There are few diverticula in the distal colon. No significant osseous abnormality.  IMPRESSION: Acute appendicitis. Adjacent inflammation of the tip of the cecum and terminal ileum.   Electronically Signed   By: Geanie Cooley M.D.   On: 10/17/2013 15:49    Anti-infectives: Anti-infectives   Start     Dose/Rate Route Frequency Ordered Stop   10/18/13 1615  ertapenem (INVANZ) 1 g in sodium chloride 0.9 % 50 mL IVPB  Status:  Discontinued     1 g 100 mL/hr over 30 Minutes Intravenous To Surgery 10/18/13 1607 10/18/13 1821   10/17/13 2230  ertapenem (INVANZ) 1 g in sodium chloride 0.9 % 50 mL IVPB     1 g 100 mL/hr over 30 Minutes Intravenous Every 24 hours 10/17/13 2221  Assessment/Plan: s/p Procedure(s): APPENDECTOMY LAPAROSCOPIC Severe appendicitis with purulence in the right lower quadrant. Stable/improved postoperatively. Continue IV antibiotics today. He has mild wheezing and a history of bronchitis. Will order albuterol.     LOS: 2 days    Akosua Constantine T 10/19/2013

## 2013-10-20 ENCOUNTER — Inpatient Hospital Stay (HOSPITAL_COMMUNITY): Payer: Medicare Other

## 2013-10-20 LAB — MAGNESIUM: Magnesium: 2.4 mg/dL (ref 1.5–2.5)

## 2013-10-20 LAB — BASIC METABOLIC PANEL
BUN: 16 mg/dL (ref 6–23)
CO2: 27 mEq/L (ref 19–32)
Calcium: 8.7 mg/dL (ref 8.4–10.5)
Chloride: 96 mEq/L (ref 96–112)
Glucose, Bld: 131 mg/dL — ABNORMAL HIGH (ref 70–99)
Potassium: 3.6 mEq/L (ref 3.5–5.1)
Sodium: 136 mEq/L (ref 135–145)

## 2013-10-20 LAB — CBC: HCT: 43.2 % (ref 39.0–52.0)

## 2013-10-20 MED ORDER — CHLORHEXIDINE GLUCONATE CLOTH 2 % EX PADS
6.0000 | MEDICATED_PAD | Freq: Every day | CUTANEOUS | Status: DC
  Administered 2013-10-20 – 2013-10-23 (×3): 6 via TOPICAL

## 2013-10-20 MED ORDER — POTASSIUM CHLORIDE IN NACL 20-0.9 MEQ/L-% IV SOLN
INTRAVENOUS | Status: DC
  Administered 2013-10-20 – 2013-10-23 (×5): via INTRAVENOUS
  Filled 2013-10-20 (×10): qty 1000

## 2013-10-20 MED ORDER — PANTOPRAZOLE SODIUM 40 MG IV SOLR
40.0000 mg | INTRAVENOUS | Status: DC
  Administered 2013-10-20: 40 mg via INTRAVENOUS
  Filled 2013-10-20 (×2): qty 40

## 2013-10-20 MED ORDER — HEPARIN SODIUM (PORCINE) 5000 UNIT/ML IJ SOLN
5000.0000 [IU] | Freq: Three times a day (TID) | INTRAMUSCULAR | Status: DC
  Administered 2013-10-20 – 2013-10-23 (×10): 5000 [IU] via SUBCUTANEOUS
  Filled 2013-10-20 (×12): qty 1

## 2013-10-20 MED ORDER — MUPIROCIN 2 % EX OINT
1.0000 "application " | TOPICAL_OINTMENT | Freq: Two times a day (BID) | CUTANEOUS | Status: DC
  Administered 2013-10-20 – 2013-10-23 (×7): 1 via NASAL
  Filled 2013-10-20: qty 22

## 2013-10-20 NOTE — Progress Notes (Signed)
Patient has an ileus.  Wounds are red and we have to watch.  Not surprising that he has an ileus.   Marta Lamas. Gae Bon, MD, FACS [phone] [facility]

## 2013-10-20 NOTE — Progress Notes (Signed)
2 Days Post-Op  Subjective: Not eating very distended, not really nauseated, port sites are a bit erythematous, No flatus  Objective: Vital signs in last 24 hours: Temp:  [99 F (37.2 C)-100.7 F (38.2 C)] 100.7 F (38.2 C) (12/25 0449) Pulse Rate:  [73-77] 73 (12/25 0449) Resp:  [18-22] 18 (12/25 0449) BP: (134-145)/(59-71) 134/59 mmHg (12/25 0449) SpO2:  [90 %-94 %] 90 % (12/25 0449) Last BM Date:  (pta) 160 PO recorded Diet: carb modified Temp 100.7 4 AM today No labs  Intake/Output from previous day:   Intake/Output this shift:    General appearance: alert, cooperative and no distress Resp: clear to auscultation bilaterally GI: distended and tight, no BS, port sites are a little red.   Lab Results:   Recent Labs  10/17/13 1751 10/18/13 0555  WBC 12.5* 9.2  HGB 14.7 13.0  HCT 42.8 38.1*  PLT 175 172    BMET  Recent Labs  10/17/13 1751  NA 134*  K 3.2*  CL 93*  CO2 27  GLUCOSE 131*  BUN 15  CREATININE 0.98  CALCIUM 9.0   PT/INR  Recent Labs  10/17/13 1751  LABPROT 13.6  INR 1.06     Recent Labs Lab 10/17/13 1751  AST 21  ALT 9  ALKPHOS 52  BILITOT 1.0  PROT 7.7  ALBUMIN 3.9     Lipase     Component Value Date/Time   LIPASE 43 07/23/2009 0636     Studies/Results: No results found.  Medications: . albuterol  2.5 mg Nebulization QID  . amLODipine  10 mg Oral Daily  . Chlorhexidine Gluconate Cloth  6 each Topical Q0600  . ertapenem [facility]) IV  1 g Intravenous Q24H  . LORazepam  1 mg Intravenous Once  . mupirocin ointment  1 application Nasal BID   . sodium chloride 75 mL/hr at 10/19/13 0911   Prior to Admission medications   Medication Sig Start Date End Date Taking? Authorizing Provider  amLODipine (NORVASC) 10 MG tablet Take 10 mg by mouth daily.   Yes Historical Provider, MD  atorvastatin (LIPITOR) 40 MG tablet Take 40 mg by mouth daily.   Yes Historical Provider, MD  clopidogrel (PLAVIX) 75 MG tablet Take 75 mg by  mouth daily with breakfast.   Yes Historical Provider, MD  ferrous sulfate 325 (65 FE) MG tablet Take 325 mg by mouth daily with breakfast.   Yes Historical Provider, MD  Fish Oil-Cholecalciferol (FISH OIL + D3 PO) Take 1 capsule by mouth daily.   Yes Historical Provider, MD  loratadine (CLARITIN) 10 MG tablet Take 10 mg by mouth daily.   Yes Historical Provider, MD  losartan-hydrochlorothiazide (HYZAAR) 50-12.5 MG per tablet Take 1 tablet by mouth daily.   Yes Historical Provider, MD  Multiple Vitamin (MULTI VITAMIN MENS PO) Take 1 tablet by mouth daily.   Yes Historical Provider, MD  pantoprazole (PROTONIX) 20 MG tablet Take 20 mg by mouth daily.   Yes Historical Provider, MD     Assessment/Plan Acute appendicitis with rupture S/p APPENDECTOMY LAPAROSCOPIC, 10/18/2013,James Charlsie Quest, MD Hypertension  AODM  Stroke on plavix  Hypercholesteremia  GERD   Plan:  Go back to clear liquids, continue antibiotics IV, recheck labs, mobilize some, heparin/scd's for DVT.  He and his wife are concerned about his Home meds.  I will get him back on BP meds as he needs them, restart some protonix IV.  LOS: 3 days    Chanel Mckesson 10/20/2013

## 2013-10-21 ENCOUNTER — Inpatient Hospital Stay (HOSPITAL_COMMUNITY): Payer: Medicare Other

## 2013-10-21 LAB — BASIC METABOLIC PANEL
BUN: 16 mg/dL (ref 6–23)
Calcium: 7.9 mg/dL — ABNORMAL LOW (ref 8.4–10.5)
GFR calc Af Amer: >90 mL/min (ref >90)
GFR calc non Af Amer: 79 mL/min — ABNORMAL LOW (ref >90)
Potassium: 3.8 mEq/L (ref 3.5–5.1)
Sodium: 139 mEq/L (ref 135–145)

## 2013-10-21 LAB — CBC
HCT: 41 % (ref 39.0–52.0)
MCH: 31.2 pg (ref 26.0–34.0)
MCHC: 33.7 g/dL (ref 30.0–36.0)
RDW: 13.6 % (ref 11.5–15.5)

## 2013-10-21 MED ORDER — PANTOPRAZOLE SODIUM 40 MG IV SOLR
40.0000 mg | Freq: Two times a day (BID) | INTRAVENOUS | Status: DC
  Administered 2013-10-21 – 2013-10-22 (×4): 40 mg via INTRAVENOUS
  Filled 2013-10-21 (×5): qty 40

## 2013-10-21 NOTE — Progress Notes (Signed)
[facility] Progress Note 3 Days Post-Op  Subjective: Patient has had two large bowel movements this morning.  Has bloody drainage out of NGT   Objective: Vital signs in last 24 hours: Temp:  [97.9 F (36.6 C)-98.9 F (37.2 C)] 98.5 F (36.9 C) 24-Oct-2023 0552) Pulse Rate:  [69-77] 77 Oct 24, 2023 0552) Resp:  [16-18] 16 10/24/2023 0552) BP: (157-173)/(68-80) 168/80 mmHg 10/24/23 0552) SpO2:  [93 %-98 %] 94 % 2023/10/24 0645) Last BM Date: 10/15/13  Intake/Output from previous day: 12/25 0701 - October 24, 2023 0700 In: 965 [P.O.:100; I.V.:115; NG/GT:750] Out: 400 [Emesis/NG output:400] Intake/Output this shift: Total I/O In: -  Out: 1 [Stool:1]  General: No acute distress  Lungs: Clear  Abd: Softer than yesterday.  Has some good bowel sounds.  Bloody NGT drainage.  Had two l arge MBs today.  Wants to have another.  Extremities: No clinical signs or symptoms of DVT  Neuro: Intact  Lab Results:  @LABLAST2 (wbc:2,hgb:2,hct:2,plt:2) BMET  Recent Labs  10/20/13 1055 10/23/13 0620  NA 136 139  K 3.6 3.8  CL 96 102  CO2 27 25  GLUCOSE 131* 109*  BUN 16 16  CREATININE 1.03 0.96  CALCIUM 8.7 7.9*   PT/INR No results found for this basename: LABPROT, INR,  in the last 72 hours ABG No results found for this basename: PHART, PCO2, PO2, HCO3,  in the last 72 hours  Studies/Results: Dg Abd 2 Views  10-23-13   CLINICAL DATA:  Postop ileus  EXAM: ABDOMEN - 2 VIEW  COMPARISON:  10/20/2013  FINDINGS: An NG tube is seen tracking along the expected course the stomach, with tip projecting in the expected region of the gastric antrum. Multiple dilated loops of small bowel appreciated containing air-fluid levels with regions of stacking. There is a paucity of appreciable distal bowel gas. There is decrease in the amount of contrast in the colon recommended previous study. The osseous structures unremarkable.  IMPRESSION: Findings again reflecting either an adynamic ileus or possibly a partial small bowel  obstruction. And there has been a component of decompression of the large bowel and the small bowel findings are unchanged. Continued surveillance evaluation recommended.   Electronically Signed   By: Salome Holmes M.D.   On: 2013/10/23 07:54   Dg Abd 2 Views  10/20/2013   CLINICAL DATA:  Abdominal pain and nausea.  Abdominal distention.  EXAM: ABDOMEN - 2 VIEW  COMPARISON:  10/17/2013  FINDINGS: There are multiple loops of dilated small bowel with air-fluid levels. Residual contrast is noted in the colon which is normal in caliber. No free air  IMPRESSION: Findings are consistent with a partial small bowel obstruction. Potentially the findings may reflect a diffuse adynamic ileus, which is felt less likely. No free air.   Electronically Signed   By: Amie Portland M.D.   On: 10/20/2013 09:55    Anti-infectives: Anti-infectives   Start     Dose/Rate Route Frequency Ordered Stop   10/18/13 1615  ertapenem (INVANZ) 1 g in sodium chloride 0.9 % 50 mL IVPB  Status:  Discontinued     1 g 100 mL/hr over 30 Minutes Intravenous To Surgery 10/18/13 1607 10/18/13 1821   10/17/13 2230  ertapenem (INVANZ) 1 g in sodium chloride 0.9 % 50 mL IVPB     1 g 100 mL/hr over 30 Minutes Intravenous Every 24 hours 10/17/13 2221        Assessment/Plan: s/p Procedure(s): APPENDECTOMY LAPAROSCOPIC Keep NGT for now.  Abdominal X-rays still show a significant ileus  pattern although he looks better clinically.  Increase his protonix to q12h Possible clamp or remove NGT later today or toomorrow.  LOS: 4 days   Marta Lamas. Gae Bon, MD, FACS [phone] [facility] 10/21/2013

## 2013-10-22 MED ORDER — ALBUTEROL SULFATE (5 MG/ML) 0.5% IN NEBU
2.5000 mg | INHALATION_SOLUTION | Freq: Three times a day (TID) | RESPIRATORY_TRACT | Status: DC
  Administered 2013-10-22 (×2): 2.5 mg via RESPIRATORY_TRACT
  Filled 2013-10-22 (×2): qty 0.5

## 2013-10-22 NOTE — Progress Notes (Signed)
4 Days Post-Op  Subjective: No complaints. Several bm's overnight  Objective: Vital signs in last 24 hours: Temp:  [98.5 F (36.9 C)-98.7 F (37.1 C)] 98.7 F (37.1 C) (12/27 0612) Pulse Rate:  [63-72] 63 (12/27 0612) Resp:  [16-18] 16 (12/27 0612) BP: (158-161)/(62-74) 158/62 mmHg (12/27 0612) SpO2:  [92 %-97 %] 97 % (12/27 0857) Last BM Date: 10-27-2013  Intake/Output from previous day: October 28, 2023 0701 - 12/27 0700 In: 2436.7 [P.O.:30; I.V.:2406.7] Out: 1351 [Urine:800; Emesis/NG output:550; Stool:1] Intake/Output this shift:    Resp: clear to auscultation bilaterally Cardio: regular rate and rhythm GI: soft, nontender. good bs  Lab Results:   Recent Labs  10/20/13 1055 2013-10-27 0620  WBC 6.9 5.8  HGB 14.7 13.8  HCT 43.2 41.0  PLT 220 222   BMET  Recent Labs  10/20/13 1055 2013/10/27 0620  NA 136 139  K 3.6 3.8  CL 96 102  CO2 27 25  GLUCOSE 131* 109*  BUN 16 16  CREATININE 1.03 0.96  CALCIUM 8.7 7.9*   PT/INR No results found for this basename: LABPROT, INR,  in the last 72 hours ABG No results found for this basename: PHART, PCO2, PO2, HCO3,  in the last 72 hours  Studies/Results: Dg Abd 2 Views  10-27-2013   CLINICAL DATA:  Postop ileus  EXAM: ABDOMEN - 2 VIEW  COMPARISON:  10/20/2013  FINDINGS: An NG tube is seen tracking along the expected course the stomach, with tip projecting in the expected region of the gastric antrum. Multiple dilated loops of small bowel appreciated containing air-fluid levels with regions of stacking. There is a paucity of appreciable distal bowel gas. There is decrease in the amount of contrast in the colon recommended previous study. The osseous structures unremarkable.  IMPRESSION: Findings again reflecting either an adynamic ileus or possibly a partial small bowel obstruction. And there has been a component of decompression of the large bowel and the small bowel findings are unchanged. Continued surveillance evaluation  recommended.   Electronically Signed   By: Salome Holmes M.D.   On: 10-27-13 07:54    Anti-infectives: Anti-infectives   Start     Dose/Rate Route Frequency Ordered Stop   10/18/13 1615  ertapenem (INVANZ) 1 g in sodium chloride 0.9 % 50 mL IVPB  Status:  Discontinued     1 g 100 mL/hr over 30 Minutes Intravenous To Surgery 10/18/13 1607 10/18/13 1821   10/17/13 2230  ertapenem (INVANZ) 1 g in sodium chloride 0.9 % 50 mL IVPB     1 g 100 mL/hr over 30 Minutes Intravenous Every 24 hours 10/17/13 2221        Assessment/Plan: s/p Procedure(s): APPENDECTOMY LAPAROSCOPIC (N/A) Advance diet D/c ng Ambulate Continue abx  LOS: 5 days    TOTH III,Sarahmarie Leavey S 10/22/2013

## 2013-10-23 MED ORDER — PANTOPRAZOLE SODIUM 40 MG PO TBEC
40.0000 mg | DELAYED_RELEASE_TABLET | Freq: Two times a day (BID) | ORAL | Status: DC
  Administered 2013-10-23: 40 mg via ORAL
  Filled 2013-10-23: qty 1

## 2013-10-23 MED ORDER — HYDROCODONE-ACETAMINOPHEN 5-325 MG PO TABS: 1.0000 | ORAL_TABLET | ORAL | Status: DC | PRN

## 2013-10-23 MED ORDER — AMOXICILLIN-POT CLAVULANATE 875-125 MG PO TABS: 1.0000 | ORAL_TABLET | Freq: Two times a day (BID) | ORAL | Status: DC

## 2013-10-23 NOTE — Progress Notes (Signed)
Patient discharged to home with instructions. 

## 2013-10-23 NOTE — Discharge Summary (Signed)
Physician Discharge Summary  Patient ID: Jeff Watkins MRN: [id number] DOB/AGE: 1936/09/13 77 y.o.  Admit date: 10/17/2013 Discharge date: 10/23/2013  Admission Diagnoses:appendicitis  Discharge Diagnoses: s/p laparoscopic appendectomy Active Problems:   Appendicitis   Discharged Condition: good  Hospital Course: patient underwent laparoscopic appendectomy. He had a mild post-op ileus which resolved. He is D/C home on POD#5 in stable condition. Plan one week ABX at home.  Consults: None  Significant Diagnostic Studies: CT  Discharge Exam: Blood pressure 151/60, pulse 59, temperature 98.5 F (36.9 C), temperature source Oral, resp. rate 16, height 5\' 8"  (1.727 m), weight 235 lb 7.2 oz (106.8 kg), SpO2 94.00%. General appearance: alert and cooperative Resp: clear to auscultation bilaterally Cardio: S1, S2 normal GI: soft, incisions CDI, NT  Disposition:    Future Appointments Provider Department Dept Phone   11/08/2013 3:30 PM [facility], [place] [phone]       Medication List         amLODipine 10 MG tablet  Commonly known as:  NORVASC  Take 10 mg by mouth daily.     amoxicillin-clavulanate 875-125 MG per tablet  Commonly known as:  AUGMENTIN  Take 1 tablet by mouth 2 (two) times daily.     atorvastatin 40 MG tablet  Commonly known as:  LIPITOR  Take 40 mg by mouth daily.     clopidogrel 75 MG tablet  Commonly known as:  PLAVIX  Take 75 mg by mouth daily with breakfast.     ferrous sulfate 325 (65 FE) MG tablet  Take 325 mg by mouth daily with breakfast.     FISH OIL + D3 PO  Take 1 capsule by mouth daily.     HYDROcodone-acetaminophen 5-325 MG per tablet  Commonly known as:  NORCO/VICODIN  Take 1-2 tablets by mouth every 4 (four) hours as needed (pain).     loratadine 10 MG tablet  Commonly known as:  CLARITIN  Take 10 mg by mouth daily.     losartan-hydrochlorothiazide 50-12.5 MG per tablet  Commonly known as:   HYZAAR  Take 1 tablet by mouth daily.     MULTI VITAMIN MENS PO  Take 1 tablet by mouth daily.     pantoprazole 20 MG tablet  Commonly known as:  PROTONIX  Take 20 mg by mouth daily.           Follow-up Information   Follow up with [facility],MD, MD On 11/08/2013. (3:00PM)    Specialty:  General Surgery   Contact information:   [address] [place] [address] [place] [address] [phone]       Signed: Liz Malady 10/23/2013, 11:33 AM

## 2013-11-08 ENCOUNTER — Ambulatory Visit (INDEPENDENT_AMBULATORY_CARE_PROVIDER_SITE_OTHER): Payer: Medicare Other | Admitting: General Surgery

## 2013-11-08 ENCOUNTER — Encounter (INDEPENDENT_AMBULATORY_CARE_PROVIDER_SITE_OTHER): Payer: Self-pay

## 2013-11-08 VITALS — BP 150/96 | HR 60 | Temp 97.1°F | Resp 14 | Ht 68.0 in | Wt 230.4 lb

## 2013-11-08 DIAGNOSIS — K3580 Unspecified acute appendicitis: Secondary | ICD-10-CM

## 2013-11-08 NOTE — Progress Notes (Signed)
Jeff Watkins 1935/10/30 [id number] 11/08/2013   History of Present Illness: Jeff Watkins is a  78 y.o. male who presents today status post lap appy by Dr. Jimmye NormanJames Wyatt.  Pathology reveals acute suppurative appendicitis.  The patient is tolerating a regular diet, having normal bowel movements, has good pain control.  He  is back to most normal activities.   Physical Exam: Abd: soft, nontender, active bowel sounds, nondistended.  All incisions are well healed.  Impression: 1.  Acute appendicitis, s/p lap appy  Plan: He is able to return to normal activities. He may follow up on a prn basis.

## 2013-11-08 NOTE — Patient Instructions (Signed)
Follow up as needed

## 2015-04-11 ENCOUNTER — Other Ambulatory Visit: Payer: Self-pay | Admitting: Gastroenterology

## 2015-05-10 ENCOUNTER — Encounter (HOSPITAL_COMMUNITY): Payer: Self-pay | Admitting: *Deleted

## 2015-05-18 ENCOUNTER — Other Ambulatory Visit: Payer: Self-pay | Admitting: Gastroenterology

## 2015-05-21 ENCOUNTER — Encounter (HOSPITAL_COMMUNITY): Payer: Self-pay

## 2015-05-21 ENCOUNTER — Ambulatory Visit (HOSPITAL_COMMUNITY): Payer: Medicare Other | Admitting: Certified Registered Nurse Anesthetist

## 2015-05-21 ENCOUNTER — Encounter (HOSPITAL_COMMUNITY)
Admission: RE | Disposition: A | Discharge status: Home / Self Care | Payer: Self-pay | Source: Ambulatory Visit | Attending: Gastroenterology

## 2015-05-21 ENCOUNTER — Ambulatory Visit (HOSPITAL_COMMUNITY)
Admission: RE | Admit: 2015-05-21 | Discharge: 2015-05-21 | Disposition: A | Discharge status: Home / Self Care | Payer: Medicare Other | Source: Ambulatory Visit | Attending: Gastroenterology | Admitting: Gastroenterology

## 2015-05-21 DIAGNOSIS — E119 Type 2 diabetes mellitus without complications: Secondary | ICD-10-CM | POA: Diagnosis not present

## 2015-05-21 DIAGNOSIS — G2581 Restless legs syndrome: Secondary | ICD-10-CM | POA: Diagnosis not present

## 2015-05-21 DIAGNOSIS — Z87891 Personal history of nicotine dependence: Secondary | ICD-10-CM | POA: Diagnosis not present

## 2015-05-21 DIAGNOSIS — Z8673 Personal history of transient ischemic attack (TIA), and cerebral infarction without residual deficits: Secondary | ICD-10-CM | POA: Diagnosis not present

## 2015-05-21 DIAGNOSIS — Z09 Encounter for follow-up examination after completed treatment for conditions other than malignant neoplasm: Secondary | ICD-10-CM | POA: Diagnosis present

## 2015-05-21 DIAGNOSIS — Z800 Family history of malignant neoplasm of digestive organs: Secondary | ICD-10-CM | POA: Insufficient documentation

## 2015-05-21 DIAGNOSIS — K5730 Diverticulosis of large intestine without perforation or abscess without bleeding: Secondary | ICD-10-CM | POA: Insufficient documentation

## 2015-05-21 DIAGNOSIS — I2510 Atherosclerotic heart disease of native coronary artery without angina pectoris: Secondary | ICD-10-CM | POA: Insufficient documentation

## 2015-05-21 DIAGNOSIS — Z86010 Personal history of colonic polyps: Secondary | ICD-10-CM | POA: Diagnosis not present

## 2015-05-21 DIAGNOSIS — I10 Essential (primary) hypertension: Secondary | ICD-10-CM | POA: Diagnosis not present

## 2015-05-21 DIAGNOSIS — Z79899 Other long term (current) drug therapy: Secondary | ICD-10-CM | POA: Insufficient documentation

## 2015-05-21 DIAGNOSIS — I252 Old myocardial infarction: Secondary | ICD-10-CM | POA: Insufficient documentation

## 2015-05-21 DIAGNOSIS — G4733 Obstructive sleep apnea (adult) (pediatric): Secondary | ICD-10-CM | POA: Insufficient documentation

## 2015-05-21 DIAGNOSIS — I509 Heart failure, unspecified: Secondary | ICD-10-CM | POA: Diagnosis not present

## 2015-05-21 DIAGNOSIS — Z6834 Body mass index (BMI) 34.0-34.9, adult: Secondary | ICD-10-CM | POA: Diagnosis not present

## 2015-05-21 HISTORY — PX: COLONOSCOPY WITH PROPOFOL: SHX5780

## 2015-05-21 LAB — GLUCOSE, CAPILLARY: Glucose-Capillary: 111 mg/dL — ABNORMAL HIGH (ref 65–99)

## 2015-05-21 SURGERY — COLONOSCOPY WITH PROPOFOL
Anesthesia: Monitor Anesthesia Care

## 2015-05-21 MED ORDER — LIDOCAINE HCL (CARDIAC) 20 MG/ML IV SOLN
INTRAVENOUS | Status: DC | PRN
  Administered 2015-05-21: 60 mg via INTRAVENOUS

## 2015-05-21 MED ORDER — PROPOFOL 500 MG/50ML IV EMUL
INTRAVENOUS | Status: DC | PRN
  Administered 2015-05-21 (×2): 30 mg via INTRAVENOUS

## 2015-05-21 MED ORDER — PROPOFOL 10 MG/ML IV BOLUS
INTRAVENOUS | Status: AC
  Filled 2015-05-21: qty 20

## 2015-05-21 MED ORDER — PROPOFOL INFUSION 10 MG/ML OPTIME
INTRAVENOUS | Status: DC | PRN
  Administered 2015-05-21: 75 ug/kg/min via INTRAVENOUS

## 2015-05-21 MED ORDER — LIDOCAINE HCL (CARDIAC) 20 MG/ML IV SOLN
INTRAVENOUS | Status: AC
  Filled 2015-05-21: qty 5

## 2015-05-21 MED ORDER — SODIUM CHLORIDE 0.9 % IV SOLN: INTRAVENOUS | Status: DC

## 2015-05-21 MED ORDER — LACTATED RINGERS IV SOLN
INTRAVENOUS | Status: DC
  Administered 2015-05-21: 1000 mL via INTRAVENOUS

## 2015-05-21 SURGICAL SUPPLY — 22 items

## 2015-05-21 NOTE — H&P (Signed)
  Procedure: Surveillance colonoscopy. Brother diagnosed with colon cancer after a 60. Normal surveillance colonoscopies performed on 05/21/2010 and 03/12/2005. 2003 colonoscopy performed with removal of a 1 cm sigmoid colon polyp; I could not find the pathology report in the electronic medical records at our office or the hospital.  History: The patient is a 79 year old male born [birth year]. He is scheduled to undergo a surveillance colonoscopy today. He stopped taking Plavix one week ago.  Past medical history: Right parietal lacunar stroke in 2008. Hypertension. Type 2 diabetes mellitus. Non-occlusive coronary artery disease. Hypercholesterolemia. Peptic ulcer disease complicated by bleeding in the past. Restless leg syndrome. Plantar fasciitis. Obstructive sleep apnea syndrome. Allergic rhinitis. Left inguinal hernia repair. Undescended right testicle removed surgically. Right cataract surgery. Appendectomy.  Medication allergies: Penicillin and sulfa drugs  Exam: The patient is alert and lying comfortably on the endoscopy stretcher. Abdomen is soft and nontender to palpation. Lungs are clear to auscultation. Cardiac exam reveals a regular rhythm.  Plan: Proceed with surveillance colonoscopy

## 2015-05-21 NOTE — Anesthesia Preprocedure Evaluation (Signed)
Anesthesia Evaluation   Patient awake    Reviewed: Allergy & Precautions, NPO status , Patient's Chart, lab work & pertinent test results  History of Anesthesia Complications Negative for: history of anesthetic complications  Airway Mallampati: III  TM Distance: >3 FB Neck ROM: Full    Dental  (+) Teeth Intact   Pulmonary neg shortness of breath, neg COPDneg recent URI, former smoker,  breath sounds clear to auscultation        Cardiovascular hypertension, Pt. on medications - angina- Past MI and - CHF - dysrhythmias Rhythm:Regular     Neuro/Psych CVA, No Residual Symptoms negative psych ROS   GI/Hepatic negative GI ROS, Neg liver ROS,   Endo/Other  diabetes, Type obesity  Renal/GU negative Renal ROS     Musculoskeletal   Abdominal   Peds  Hematology negative hematology ROS (+)   Anesthesia Other Findings   Reproductive/Obstetrics                             Anesthesia Physical Anesthesia Plan  ASA: II  Anesthesia Plan: MAC   Post-op Pain Management:    Induction: Intravenous  Airway Management Planned: Nasal Cannula  Additional Equipment: None  Intra-op Plan:   Post-operative Plan:   Informed Consent: I have reviewed the patients History and Physical, chart, labs and discussed the procedure including the risks, benefits and alternatives for the proposed anesthesia with the patient or authorized representative who has indicated his/her understanding and acceptance.   Dental advisory given  Plan Discussed with: CRNA and Surgeon  Anesthesia Plan Comments:         Anesthesia Quick Evaluation

## 2015-05-21 NOTE — Op Note (Signed)
Procedure: Surveillance colonoscopy. Normal surveillance colonoscopies performed on 05/21/2010 and 03/12/2005. Brother diagnosed with colon cancer after age 77  Endoscopist: Danise Edge  Premedication: Propofol administered by anesthesia  Procedure: The patient was placed in the left lateral decubitus position. Anal inspection and digital rectal exam were normal. The Pentax pediatric colonoscope was introduced into the rectum and advanced to the cecum. A normal-appearing appendiceal orifice and ileocecal valve were identified. Colonic preparation for the exam today was good. Withdrawal time was 12 minutes  Rectum. Normal. Retroflexed view of the distal rectum normal  Sigmoid colon and descending colon. Left colonic diverticulosis  Splenic flexure. Normal  Transverse colon. Normal  Hepatic flexure. Normal  Ascending colon. Normal.  Cecum and ileocecal valve. Normal  Assessment: Normal surveillance colonoscopy

## 2015-05-21 NOTE — Transfer of Care (Signed)
Immediate Anesthesia Transfer of Care Note  Patient: Jeff Watkins  Procedure(s) Performed: Procedure(s): COLONOSCOPY WITH PROPOFOL (N/A)  Patient Location: PACU  Anesthesia Type:MAC  Level of Consciousness: awake, alert  and oriented  Airway & Oxygen Therapy: Patient Spontanous Breathing and Patient connected to face mask oxygen  Post-op Assessment: Report given to RN and Post -op Vital signs reviewed and stable  Post vital signs: Reviewed and stable  Last Vitals:  Filed Vitals:   05/21/15 0753  BP: 172/68  Pulse: 51  Temp: 36.4 C  Resp: 14    Complications: No apparent anesthesia complications

## 2015-05-21 NOTE — Anesthesia Postprocedure Evaluation (Signed)
  Anesthesia Post-op Note  Patient: Jeff Watkins  Procedure(s) Performed: Procedure(s): COLONOSCOPY WITH PROPOFOL (N/A)  Patient Location: Endoscopy Unit  Anesthesia Type:MAC  Level of Consciousness: awake  Airway and Oxygen Therapy: Patient Spontanous Breathing  Post-op Pain: none  Post-op Assessment: Post-op Vital signs reviewed, Patient's Cardiovascular Status Stable, Respiratory Function Stable, Patent Airway, No signs of Nausea or vomiting and Pain level controlled              Post-op Vital Signs: Reviewed and stable  Last Vitals:  Filed Vitals:   05/21/15 0910  BP: 193/72  Pulse:   Temp:   Resp: 17    Complications: No apparent anesthesia complications

## 2015-05-21 NOTE — Discharge Instructions (Signed)

## 2015-05-22 ENCOUNTER — Encounter (HOSPITAL_COMMUNITY): Payer: Self-pay | Admitting: Gastroenterology

## 2016-02-05 ENCOUNTER — Telehealth: Payer: Self-pay | Admitting: Cardiovascular Disease

## 2016-02-05 NOTE — Telephone Encounter (Signed)
[facility] on 02/25/16 with Dr Duke Salviaandolph.,  [facility] for Dr Leonides Sakeandolph's schedule on 02/25/16. lp

## 2016-02-25 ENCOUNTER — Ambulatory Visit (INDEPENDENT_AMBULATORY_CARE_PROVIDER_SITE_OTHER): Payer: Medicare Other | Admitting: Cardiovascular Disease

## 2016-02-25 ENCOUNTER — Encounter: Payer: Self-pay | Admitting: Cardiovascular Disease

## 2016-02-25 VITALS — BP 161/81 | HR 63 | Ht 68.0 in | Wt 236.0 lb

## 2016-02-25 DIAGNOSIS — R0602 Shortness of breath: Secondary | ICD-10-CM

## 2016-02-25 DIAGNOSIS — I10 Essential (primary) hypertension: Secondary | ICD-10-CM | POA: Diagnosis not present

## 2016-02-25 DIAGNOSIS — E669 Obesity, unspecified: Secondary | ICD-10-CM

## 2016-02-25 DIAGNOSIS — R0609 Other forms of dyspnea: Secondary | ICD-10-CM | POA: Diagnosis not present

## 2016-02-25 DIAGNOSIS — E785 Hyperlipidemia, unspecified: Secondary | ICD-10-CM | POA: Insufficient documentation

## 2016-02-25 DIAGNOSIS — R0600 Dyspnea, unspecified: Secondary | ICD-10-CM

## 2016-02-25 DIAGNOSIS — Z79899 Other long term (current) drug therapy: Secondary | ICD-10-CM

## 2016-02-25 HISTORY — DX: Hyperlipidemia, unspecified: E78.5

## 2016-02-25 HISTORY — DX: Obesity, unspecified: E66.9

## 2016-02-25 HISTORY — DX: Shortness of breath: R06.02

## 2016-02-25 HISTORY — DX: Essential (primary) hypertension: I10

## 2016-02-25 MED ORDER — LOSARTAN POTASSIUM-HCTZ 100-25 MG PO TABS: 1.0000 | ORAL_TABLET | Freq: Every day | ORAL | Status: DC

## 2016-02-25 NOTE — Patient Instructions (Addendum)
Medication Instructions:  INCREASE YOUR LOSARTAN HCT TO 100-25 MG DAILY  Labwork: BMET IN 1 WEEK AT [facility] ON THE FIRST FLOOR  Testing/Procedures: Your physician has requested that you have an echocardiogram. Echocardiography is a painless test that uses sound waves to create images of your heart. It provides your doctor with information about the size and shape of your heart and how well your heart's chambers and valves are working. This procedure takes approximately one hour. There are no restrictions for this procedure.  Your physician has requested that you have en exercise stress myoview. For further information please visit [email or web address]. Please follow instruction sheet, as given.  Follow-Up: Your physician recommends that you schedule a follow-up appointment in: 1 MONTH OV  If you need a refill on your cardiac medications before your next appointment, please call your pharmacy.

## 2016-02-25 NOTE — Progress Notes (Signed)
h   Cardiology Office Note   Date:  02/25/2016   ID:  Jeff Watkins, DOB [birth year], MRN [id number]  PCP:  Jeff Cloud Landry Mellow, MD  Cardiologist:   [name], MD   Chief Complaint  Patient presents with  . New Patient (Initial Visit)    SOB;due to exertion CHEST PAIN;none LIGHTHEADED/DIZZINESS;none PAIN OR CRAMPING IN LEGS;none EDEMA;none       History of Present Illness: Jeff Watkins is a 80 y.o. male with CAD, hypertension, hyperlipidemia, OSA, GERD and diabeteswho presents for an evaluation of exertional dyspnea.  [name] saw his PCP, [name], on 01/30/16.  At that appointment he reported exertional dyspnea and was referred to cardiology for further evaluation.  [name] Has noticed increasing dyspnea on exertion for the last 6 months. He notices it most when he is climbing stairs or gardening. There is no associated chest pain, nausea, diaphoresis, or palpitations. He wonders if it may be due to weight gain. He does not get any formal exercise but extends a lot of energy caring for his wife who has physical limitations. He reports that his diet is very but he struggles with portion control. 20 years ago he had a heart catheterization that revealed nonobstructive coronary artery disease. He was previously on a study through the [employer] and was on atorvastatin. He has been on that medicine since that time he reports that his cholesterol levels have been well-controlled. He did have a small stroke 5 or 6 years ago and has no residual deficits. He has been on Plavix since that time.     [name] notes mild lower extremity edema that improves when he wears compression stockings or any elevates his legs. He denies any orthopnea or PND.  He notes that his BP has been running high lately.  When he checks it is typically in the 160s.    Past Medical History  Diagnosis Date  . Hypertension   . Diabetes mellitus without complication (HCC)   .  Hypercholesteremia   . Stroke (HCC) 5 6 years ago    mini stroke   . Essential hypertension 02/25/2016  . Hyperlipidemia 02/25/2016  . Shortness of breath 02/25/2016  . Obesity (BMI 30-39.9) 02/25/2016    Past Surgical History  Procedure Laterality Date  . Laparoscopic appendectomy N/A 10/18/2013    Procedure: APPENDECTOMY LAPAROSCOPIC;  Surgeon: Jeff Ridges, MD;  Location: [facility] OR;  Service: General;  Laterality: N/A;  . Hernia repair    . Testicle removal  1965  . Fracture surgery  1966    compound arm  . Colonoscopy with propofol N/A 05/21/2015    Procedure: COLONOSCOPY WITH PROPOFOL;  Surgeon: Jeff Bumpers, MD;  Location: [facility] ENDOSCOPY;  Service: Endoscopy;  Laterality: N/A;     Current Outpatient Prescriptions  Medication Sig Dispense Refill  . albuterol (PROVENTIL HFA;VENTOLIN HFA) 108 (90 BASE) MCG/ACT inhaler Inhale 1-2 puffs into the lungs every 6 (six) hours as needed for wheezing or shortness of breath.    [name] amLODipine (NORVASC) 10 MG tablet Take 10 mg by mouth at bedtime.     [name] atorvastatin (LIPITOR) 40 MG tablet Take 40 mg by mouth at bedtime.     . clopidogrel (PLAVIX) 75 MG tablet Take 75 mg by mouth daily with breakfast.    . ferrous sulfate 325 (65 FE) MG tablet Take 325 mg by mouth daily with breakfast.    . Fish Oil-Cholecalciferol (FISH OIL + D3 PO) Take  1 capsule by mouth every morning.     [name] ibuprofen (ADVIL) 200 MG tablet Take 400 mg by mouth every 6 (six) hours as needed for moderate pain.    [name] loratadine (CLARITIN) 10 MG tablet Take 10 mg by mouth every morning.     . Multiple Vitamin (MULTI VITAMIN MENS PO) Take 1 tablet by mouth every evening.     . pantoprazole (PROTONIX) 20 MG tablet Take 20 mg by mouth at bedtime.     Jeff Watkins Glycol-Propyl Glycol (SYSTANE OP) Apply 1 drop to eye at bedtime.    [name] testosterone (ANDROGEL) 50 MG/5GM GEL Place 5 g onto the skin every morning.     [name] losartan-hydrochlorothiazide (HYZAAR) 100-25 MG tablet Take 1 tablet by mouth  daily. 90 tablet 3   No current facility-administered medications for this visit.    Allergies:   Penicillins and Sulfur    Social History:  The patient  reports that he quit smoking about 55 years ago. He has never used smokeless tobacco. He reports that he does not drink alcohol or use illicit drugs.   Family History:  The patient's family history includes Cancer in his brother; Heart disease in his father; Stroke in his father.    ROS:  Please see the history of present illness.   Otherwise, review of systems are positive for none.   All other systems are reviewed and negative.    PHYSICAL EXAM: VS:  BP 161/81 mmHg  Pulse 63  Ht 5\' 8"  (1.727 m)  Wt 107.049 kg (236 lb)  BMI 35.89 kg/m2 , BMI Body mass index is 35.89 kg/(m^2). GENERAL:  Well appearing HEENT:  Pupils equal round and reactive, fundi not visualized, oral mucosa unremarkable NECK:  No jugular venous distention, waveform within normal limits, carotid upstroke brisk and symmetric, no bruits, no thyromegaly LYMPHATICS:  No cervical adenopathy LUNGS:  Clear to auscultation bilaterally HEART:  RRR.  PMI not displaced or sustained,S1 and S2 within normal limits, no S3, no S4, no clicks, no rubs, no murmurs ABD:  Flat, positive bowel sounds normal in frequency in pitch, no bruits, no rebound, no guarding, no midline pulsatile mass, no hepatomegaly, no splenomegaly EXT:  2 plus pulses throughout, no edema, no cyanosis no clubbing SKIN:  No rashes no nodules NEURO:  Cranial nerves II through XII grossly intact, motor grossly intact throughout PSYCH:  Cognitively intact, oriented to person place and time   EKG:  EKG is ordered today. The ekg ordered today demonstrateSinus rhythm. Rate 63 bpm. Nonspecific T wave flattening.   Recent Labs: No results found for requested labs within last 365 days.    Lipid Panel    Component Value Date/Time   CHOL  10/17/2007 0540    118        ATP III CLASSIFICATION:  <200     mg/dL    Desirable  454-098  mg/dL   Borderline High  >=119    mg/dL   High   TRIG 147 82/95/6213 0540   HDL 28* 10/17/2007 0540   CHOLHDL 4.2 10/17/2007 0540   VLDL 20 10/17/2007 0540   LDLCALC  10/17/2007 0540    70        Total Cholesterol/HDL:CHD Risk Coronary Heart Disease Risk Table                     Men   Women  1/2 Average Risk   3.4   3.3      Wt  Readings from Last 3 Encounters:  02/25/16 107.049 kg (236 lb)  05/21/15 104.327 kg (230 lb)  11/08/13 104.509 kg (230 lb 6.4 oz)      ASSESSMENT AND PLAN:  # Shortness of breath: Mr. Marland McalpineWebb's symptoms are concerning, as it could be indicators of coronary artery disease. He does not have any heart failure on exam. He has several risk factors for CAD including hypertension, hyperlipidemia, and obesity. We'll obtain an exercise Cardiolite to evaluate for ischemia. Continue Plavix and atorvastatin. I suspect that his shortness of breath may actually be due to poorly controlled hypertension. We will also obtain an echo to ensure he does not have structural heart disease.   # Hypertension: Blood pressure is poorly-controlled. We will increase his losartan/HCTZ to 100/25 mg daily. He will return for a basic metabolic panel in one week. I've asked him to record his blood pressure until I see him again in one month.  # Hyperlipidemia:  Continue atorvastatin. This is managed by his PCP.   # Obesity: [name] was encouraged to increase his physical activity to at least 30-40 minutes most days of the week.     Current medicines are reviewed at length with the patient today.  The patient does not have concerns regarding medicines.  The following changes have been made: Increase Hyzaar  Labs/ tests ordered today include:   Orders Placed This Encounter  Procedures  . Basic metabolic panel  . Myocardial Perfusion Imaging  . EKG 12-Lead  . ECHOCARDIOGRAM COMPLETE     Disposition:   FU with Cleatis Fandrich C. Duke Salviaandolph, MD, [facility] in 1 month     This note was written with the assistance of speech recognition software.  Please excuse any transcriptional errors.  Signed, Savahna Casados C. Duke Salviaandolph, MD, [facility]  02/25/2016 9:03 AM    [facility]

## 2016-03-06 LAB — BASIC METABOLIC PANEL
BUN: 12 mg/dL (ref 7–25)
CO2: 29 mmol/L (ref 20–31)
Calcium: 9.3 mg/dL (ref 8.6–10.3)
Chloride: 97 mmol/L — ABNORMAL LOW (ref 98–110)
Creat: 1.04 mg/dL (ref 0.70–1.18)
Glucose, Bld: 150 mg/dL — ABNORMAL HIGH (ref 65–99)
Potassium: 4.5 mmol/L (ref 3.5–5.3)
SODIUM: 135 mmol/L (ref 135–146)

## 2016-03-13 ENCOUNTER — Telehealth: Payer: Self-pay | Admitting: *Deleted

## 2016-03-13 NOTE — Telephone Encounter (Signed)
-----   Message from Chilton Siiffany Erie, MD sent at 03/09/2016  8:28 PM EDT ----- Normal kidney function and electrolytes.  Glucose is high.

## 2016-03-13 NOTE — Telephone Encounter (Signed)
Left message to call back  

## 2016-03-13 NOTE — Telephone Encounter (Signed)
Advised patient of lab results  

## 2016-03-14 ENCOUNTER — Telehealth (HOSPITAL_COMMUNITY): Payer: Self-pay

## 2016-03-14 NOTE — Telephone Encounter (Signed)
Encounter complete. 

## 2016-03-19 ENCOUNTER — Ambulatory Visit (HOSPITAL_COMMUNITY)
Admission: RE | Admit: 2016-03-19 | Discharge: 2016-03-19 | Disposition: A | Discharge status: Home / Self Care | Payer: Medicare Other | Source: Ambulatory Visit | Attending: Cardiovascular Disease | Admitting: Cardiovascular Disease

## 2016-03-19 ENCOUNTER — Ambulatory Visit (HOSPITAL_BASED_OUTPATIENT_CLINIC_OR_DEPARTMENT_OTHER)
Admission: RE | Admit: 2016-03-19 | Discharge: 2016-03-19 | Disposition: A | Discharge status: Home / Self Care | Payer: Medicare Other | Source: Ambulatory Visit | Attending: Cardiovascular Disease | Admitting: Cardiovascular Disease

## 2016-03-19 DIAGNOSIS — E119 Type 2 diabetes mellitus without complications: Secondary | ICD-10-CM | POA: Diagnosis not present

## 2016-03-19 DIAGNOSIS — Z87891 Personal history of nicotine dependence: Secondary | ICD-10-CM | POA: Diagnosis not present

## 2016-03-19 DIAGNOSIS — E785 Hyperlipidemia, unspecified: Secondary | ICD-10-CM | POA: Insufficient documentation

## 2016-03-19 DIAGNOSIS — I2510 Atherosclerotic heart disease of native coronary artery without angina pectoris: Secondary | ICD-10-CM | POA: Diagnosis not present

## 2016-03-19 DIAGNOSIS — G4733 Obstructive sleep apnea (adult) (pediatric): Secondary | ICD-10-CM | POA: Insufficient documentation

## 2016-03-19 DIAGNOSIS — R0600 Dyspnea, unspecified: Secondary | ICD-10-CM

## 2016-03-19 DIAGNOSIS — R0609 Other forms of dyspnea: Secondary | ICD-10-CM | POA: Diagnosis not present

## 2016-03-19 DIAGNOSIS — Z6835 Body mass index (BMI) 35.0-35.9, adult: Secondary | ICD-10-CM | POA: Insufficient documentation

## 2016-03-19 DIAGNOSIS — I071 Rheumatic tricuspid insufficiency: Secondary | ICD-10-CM | POA: Diagnosis not present

## 2016-03-19 DIAGNOSIS — I10 Essential (primary) hypertension: Secondary | ICD-10-CM | POA: Diagnosis not present

## 2016-03-19 DIAGNOSIS — E669 Obesity, unspecified: Secondary | ICD-10-CM | POA: Insufficient documentation

## 2016-03-19 LAB — MYOCARDIAL PERFUSION IMAGING
CHL CUP MPHR: 142 {beats}/min
CHL CUP NUCLEAR SDS: 3
CHL CUP STRESS STAGE 1 DBP: 85 mmHg
CHL CUP STRESS STAGE 1 GRADE: 0 %
CHL CUP STRESS STAGE 1 SBP: 191 mmHg
CHL CUP STRESS STAGE 4 GRADE: 10 %
CHL CUP STRESS STAGE 4 HR: 105 {beats}/min
CHL CUP STRESS STAGE 4 SBP: 218 mmHg
CHL CUP STRESS STAGE 4 SPEED: 1.7 mph
CHL CUP STRESS STAGE 5 GRADE: 12 %
CHL CUP STRESS STAGE 5 SPEED: 2.5 mph
CHL CUP STRESS STAGE 6 DBP: 87 mmHg
CHL CUP STRESS STAGE 6 GRADE: 12.6 %
CHL CUP STRESS STAGE 6 SBP: 168 mmHg
CHL CUP STRESS STAGE 7 SBP: 198 mmHg
CHL RATE OF PERCEIVED EXERTION: 17
CSEPPBP: 168 mmHg
CSEPPMHR: 72 %
Estimated workload: 5.4 METS
Exercise duration (min): 6 min
Exercise duration (sec): 16 s
LV dias vol: 96 mL (ref 62–150)
LV sys vol: 39 mL
Peak HR: 103 {beats}/min
Percent HR: 86 %
Rest HR: 53 {beats}/min
SRS: 0
SSS: 3
Stage 1 HR: 59 {beats}/min
Stage 1 Speed: 0 mph
Stage 2 Grade: 0 %
Stage 2 HR: 58 {beats}/min
Stage 2 Speed: 1 mph
Stage 3 Grade: 0.2 %
Stage 3 HR: 58 {beats}/min
Stage 3 Speed: 1 mph
Stage 4 DBP: 90 mmHg
Stage 5 HR: 113 {beats}/min
Stage 6 HR: 103 {beats}/min
Stage 6 Speed: 1.7 mph
Stage 7 DBP: 80 mmHg
Stage 7 Grade: 0 %
Stage 7 HR: 103 {beats}/min
Stage 7 Speed: 0 mph
Stage 8 DBP: 75 mmHg
Stage 8 Grade: 0 %
Stage 8 HR: 62 {beats}/min
Stage 8 SBP: 194 mmHg
Stage 8 Speed: 0 mph
TID: 0.98

## 2016-03-19 MED ORDER — TECHNETIUM TC 99M TETROFOSMIN IV KIT
9.3000 | PACK | Freq: Once | INTRAVENOUS | Status: AC | PRN
  Administered 2016-03-19: 9.3 via INTRAVENOUS
  Filled 2016-03-19: qty 9

## 2016-03-19 MED ORDER — TECHNETIUM TC 99M TETROFOSMIN IV KIT
28.2000 | PACK | Freq: Once | INTRAVENOUS | Status: AC | PRN
  Administered 2016-03-19: 28.2 via INTRAVENOUS
  Filled 2016-03-19: qty 28

## 2016-04-05 NOTE — Progress Notes (Signed)
h   Cardiology Office Note   Date:  04/07/2016   ID:  Jeff Watkins, DOB 1936/10/16, MRN [id number]  PCP:  Lonzo CloudShamleffer, Jeff MellowIbethal JARALLA, MD  Cardiologist:   Chilton Siiffany [place], MD   Chief Complaint  Patient presents with  . Follow-up    stress test/ ECHO  pt states no new Sx.      History of Present Illness: Jeff Watkins is a 80 y.o. male with CAD, hypertension, hyperlipidemia, OSA, GERD and diabetes who presents for follow up on exertional dyspnea.  Mr. Jeff Watkins was first seen on 5/1 with a complaint of dyspnea on exertion.  He was referred for an echo that showed LVEF 55-60% and grade 2 diastolic dysfunction.  He also had an exercise Myoview that was negative for ischemia but did reveal frequent PVCs.  He exercised for 6 minutes on the Bruce protocol (5.4 METS). At that appointment his losartan/HCTZ was increased due to poorly-controlled hypertension.  He brings a log of his blood pressures that range from 168-201/70-90s.  He uses a wrist cuff.  Mr. Jeff Watkins reports that he is tired a lot.  He does not have difficulty falling asleep. However, hehas to get up twice to urinate.  He was diagnosed with sleep apnea many 10-15 years ago but has been unable to tolerate a mask. He never used nasal prongs.  He does endorse daytime somnolence, snoring, and not feeling rested when he awakens.  He reports that his breathing has been better. He denies chest pain, lower extremity edema, orthopnea or PND.  He also denies palpitations, lightheadedness or dizziness.  Mr. Jeff Watkins does not get much exercise.  He cares for his wife who has visual impairment.  He is active around the house but does not formally exercise.  He struggles with his weight and reports that his diet is pretty healthy.  Past Medical History  Diagnosis Date  . Hypertension   . Diabetes mellitus without complication (HCC)   . Hypercholesteremia   . Stroke (HCC) 5 6 years ago    mini stroke   . Essential hypertension 02/25/2016  . Hyperlipidemia  02/25/2016  . Shortness of breath 02/25/2016  . Obesity (BMI 30-39.9) 02/25/2016    Past Surgical History  Procedure Laterality Date  . Laparoscopic appendectomy N/A 10/18/2013    Procedure: APPENDECTOMY LAPAROSCOPIC;  Surgeon: Cherylynn RidgesJames O Wyatt, MD;  [facility];  Service: General;  Laterality: N/A;  . Hernia repair    . Testicle removal  1965  . Fracture surgery  1966    compound arm  . Colonoscopy with propofol N/A 05/21/2015    Procedure: COLONOSCOPY WITH PROPOFOL;  Surgeon: Charolett BumpersMartin K Johnson, MD;  Location: [facility] ENDOSCOPY;  Service: Endoscopy;  Laterality: N/A;     Current Outpatient Prescriptions  Medication Sig Dispense Refill  . albuterol (PROVENTIL HFA;VENTOLIN HFA) 108 (90 BASE) MCG/ACT inhaler Inhale 1-2 puffs into the lungs every 6 (six) hours as needed for wheezing or shortness of breath.    [name]. amLODipine (NORVASC) 10 MG tablet Take 10 mg by mouth at bedtime.     [name]. atorvastatin (LIPITOR) 40 MG tablet Take 40 mg by mouth at bedtime.     . clopidogrel (PLAVIX) 75 MG tablet Take 75 mg by mouth daily with breakfast.    . ferrous sulfate 325 (65 FE) MG tablet Take 325 mg by mouth daily with breakfast.    . Fish Oil-Cholecalciferol (FISH OIL + D3 PO) Take 1 capsule by mouth every morning.     .[name]  ibuprofen (ADVIL) 200 MG tablet Take 400 mg by mouth every 6 (six) hours as needed for moderate pain.    [name] loratadine (CLARITIN) 10 MG tablet Take 10 mg by mouth every morning.     [name] losartan-hydrochlorothiazide (HYZAAR) 100-25 MG tablet Take 1 tablet by mouth daily. 90 tablet 3  . Multiple Vitamin (MULTI VITAMIN MENS PO) Take 1 tablet by mouth every evening.     . pantoprazole (PROTONIX) 20 MG tablet Take 20 mg by mouth at bedtime.     [name] Glycol-Propyl Glycol (SYSTANE OP) Apply 1 drop to eye at bedtime.    [name] testosterone (ANDROGEL) 50 MG/5GM GEL Place 5 g onto the skin every morning.     . metoprolol succinate (TOPROL XL) 25 MG 24 hr tablet Take 1 tablet (25 mg total) by mouth daily. 90  tablet 3   No current facility-administered medications for this visit.    Allergies:   Penicillins and Sulfur    Social History:  The patient  reports that he quit smoking about 55 years ago. He has never used smokeless tobacco. He reports that he does not drink alcohol or use illicit drugs.   Family History:  The patient's family history includes Cancer in his brother; Heart disease in his father; Stroke in his father.    ROS:  Please see the history of present illness.   Otherwise, review of systems are positive for none.   All other systems are reviewed and negative.    PHYSICAL EXAM: VS:  BP 134/68 mmHg  Pulse 54  Ht 5\' 8"  (1.727 m)  Wt 238 lb 6.4 oz (108.138 kg)  BMI 36.26 kg/m2 , BMI Body mass index is 36.26 kg/(m^2). GENERAL:  Well appearing HEENT:  Pupils equal round and reactive, fundi not visualized, oral mucosa unremarkable NECK:  No jugular venous distention, waveform within normal limits, carotid upstroke brisk and symmetric, no bruits, no thyromegaly LYMPHATICS:  No cervical adenopathy LUNGS:  Clear to auscultation bilaterally HEART:  RRR.  PMI not displaced or sustained,S1 and S2 within normal limits, no S3, no S4, no clicks, no rubs, no murmurs ABD:  Flat, positive bowel sounds normal in frequency in pitch, no bruits, no rebound, no guarding, no midline pulsatile mass, no hepatomegaly, no splenomegaly EXT:  2 plus pulses throughout, no edema, no cyanosis no clubbing SKIN:  No rashes no nodules NEURO:  Cranial nerves II through XII grossly intact, motor grossly intact throughout PSYCH:  Cognitively intact, oriented to person place and time   EKG:  EKG is ordered today. The ekg ordered today demonstrateSinus rhythm. Rate 63 bpm. Nonspecific T wave flattening.  Echo 03/19/16: Study Conclusions  - Left ventricle: The cavity size was normal. There was  mild-moderate concentric hypertrophy. Systolic function was  normal. The estimated ejection fraction was in  the range of 55%  to 60%. Wall motion was normal; there were no regional wall  motion abnormalities. Features are consistent with a pseudonormal  left ventricular filling pattern, with concomitant abnormal  relaxation and increased filling pressure (grade 2 diastolic  dysfunction). Doppler parameters are consistent with  indeterminate ventricular filling pressure. - Aortic valve: Transvalvular velocity was within the normal range.  There was no stenosis. There was no regurgitation. Valve area  (VTI): 3.1 cm^2. Valve area (Vmax): 3.11 cm^2. Valve area  (Vmean): 3.04 cm^2. - Mitral valve: Transvalvular velocity was within the normal range.  There was no evidence for stenosis. There was no regurgitation. - Left atrium: The appendage  was moderately dilated. - Right ventricle: The cavity size was mildly dilated. Wall  thickness was normal. Systolic function was normal. - Tricuspid valve: There was mild regurgitation. - Pulmonary arteries: Systolic pressure was within the normal  range. PA peak pressure: 35 mm Hg (S). - Inferior vena cava: The vessel was normal in size. The  respirophasic diameter changes were in the normal range (= 50%),  consistent with normal central venous pressure.  Exercise Myoview 03/19/16:   Nuclear stress EF: 59%. No wall motion abnormality  The left ventricular ejection fraction is normal (55-65%).  There was no ST segment deviation noted during stress. There were frequent PVCs during exercise, rare couplet  This is a low risk perfusion study, no ischemia identified. Question if frequent PVCs are contributing to dyspnea on exertion. Fair exercise effort of 6 minutes and 13 seconds.   Recent Labs: 03/05/2016: BUN 12; Creat 1.04; Potassium 4.5; Sodium 135    Lipid Panel    Component Value Date/Time   CHOL  10/17/2007 0540    118        ATP III CLASSIFICATION:  <200     mg/dL   Desirable  161-096  mg/dL   Borderline High  >=045    mg/dL    High   TRIG 409 81/19/1478 0540   HDL 28* 10/17/2007 0540   CHOLHDL 4.2 10/17/2007 0540   VLDL 20 10/17/2007 0540   LDLCALC  10/17/2007 0540    70        Total Cholesterol/HDL:CHD Risk Coronary Heart Disease Risk Table                     Men   Women  1/2 Average Risk   3.4   3.3      Wt Readings from Last 3 Encounters:  04/07/16 238 lb 6.4 oz (108.138 kg)  03/19/16 236 lb (107.049 kg)  02/25/16 236 lb (107.049 kg)      ASSESSMENT AND PLAN:  # Shortness of breath: Symptoms are improving. Echo did show grade 2 diastolic dysfunction.   We will add metoprolol for improved blood pressure control.  # Hypertension: # PVCs: Blood pressure is poorly-controlled at home but within normal limits today. We will ask him to bring his blood pressure cuff to see if it correlates with the office cuff. Continue losartan/HCTZ 100/25 mg daily. We will add metoprolol succinate 25 mg daily, which should also help his PVCs.   # Hyperlipidemia:  Continue atorvastatin. This is managed by his PCP.   # Obesity: Mr. Eckenrode was encouraged to increase his physical activity to at least 30-40 minutes most days of the week.     Current medicines are reviewed at length with the patient today.  The patient does not have concerns regarding medicines.  The following changes have been made: Increase Hyzaar  Labs/ tests ordered today include:   Orders Placed This Encounter  Procedures  . Split night study     Disposition:   FU with Imri Lor C. Duke Salvia, MD, [facility] in 6 months   This note was written with the assistance of speech recognition software.  Please excuse any transcriptional errors.  Signed, Braylynn Ghan C. Duke Salvia, MD, [facility]  04/07/2016 5:46 PM    [facility]

## 2016-04-07 ENCOUNTER — Ambulatory Visit (INDEPENDENT_AMBULATORY_CARE_PROVIDER_SITE_OTHER): Payer: Medicare Other | Admitting: Cardiovascular Disease

## 2016-04-07 ENCOUNTER — Encounter: Payer: Self-pay | Admitting: Cardiovascular Disease

## 2016-04-07 VITALS — BP 134/68 | HR 54 | Ht 68.0 in | Wt 238.4 lb

## 2016-04-07 DIAGNOSIS — E669 Obesity, unspecified: Secondary | ICD-10-CM

## 2016-04-07 DIAGNOSIS — G4733 Obstructive sleep apnea (adult) (pediatric): Secondary | ICD-10-CM

## 2016-04-07 DIAGNOSIS — I493 Ventricular premature depolarization: Secondary | ICD-10-CM

## 2016-04-07 DIAGNOSIS — R0602 Shortness of breath: Secondary | ICD-10-CM | POA: Diagnosis not present

## 2016-04-07 DIAGNOSIS — I10 Essential (primary) hypertension: Secondary | ICD-10-CM | POA: Diagnosis not present

## 2016-04-07 MED ORDER — METOPROLOL SUCCINATE ER 25 MG PO TB24: 25.0000 mg | ORAL_TABLET | Freq: Every day | ORAL | Status: DC

## 2016-04-07 NOTE — Patient Instructions (Addendum)
Medication Instructions:   START METOPROLOL SUCC ER 25 MG ONCE DAILY  Testing/Procedures:  Your physician has recommended that you have a sleep study. This test records several body functions during sleep, including: brain activity, eye movement, oxygen and carbon dioxide blood levels, heart rate and rhythm, breathing rate and rhythm, the flow of air through your mouth and nose, snoring, body muscle movements, and chest and belly movement.    Follow-Up:  Your physician recommends that you schedule a follow-up appointment in: WITH PHARM MD IN BLOOD PRESSURE CLINIC NEXT WEEK= BRING BP CUFF TO THAT APPOINTMENT  Your physician wants you to follow-up in: 6 MONTHS [facility] You will receive a reminder letter in the mail two months in advance. If you don't receive a letter, please call our office to schedule the follow-up appointment.    If you need a refill on your cardiac medications before your next appointment, please call your pharmacy.

## 2016-04-10 ENCOUNTER — Ambulatory Visit (INDEPENDENT_AMBULATORY_CARE_PROVIDER_SITE_OTHER): Payer: Medicare Other | Admitting: Pharmacist

## 2016-04-10 ENCOUNTER — Encounter: Payer: Self-pay | Admitting: Pharmacist

## 2016-04-10 VITALS — BP 158/78 | HR 55

## 2016-04-10 DIAGNOSIS — I10 Essential (primary) hypertension: Secondary | ICD-10-CM

## 2016-04-10 NOTE — Progress Notes (Signed)
Patient ID: Jeff KidneyUdell L Footman                 DOB: 31-Dec-1935                      MRN: [id number]     HPI: Jeff Watkins is a 80 y.o. male referred by Dr. Duke Salviaandolph to HTN clinic for blood pressure check and meter calibration. He reports he has been taking the increased dose of losartan/HCTZ for about 1 month now and he started the metoprolol about 2 days ago. He reports feeling great since the addition of metoprolol.   Cardiac Hx: OSA, HTN, PVC  Current HTN meds:  Losartan/HCTZ 100/25mg  in the morning Amlodipine 10mg  each evening Metoprolol XL 25mg  in the evening  Previously tried:  BP goal: <140/90  Home BP readings: He uses a wrist cuff that when used appropriately reads within 10mmHg. Instructed him on proper use of wrist cuff and technique for blood pressure measurement.   Wt Readings from Last 3 Encounters:  04/07/16 238 lb 6.4 oz (108.138 kg)  03/19/16 236 lb (107.049 kg)  02/25/16 236 lb (107.049 kg)   BP Readings from Last 3 Encounters:  04/10/16 158/78  04/07/16 134/68  02/25/16 161/81   Pulse Readings from Last 3 Encounters:  04/10/16 55  04/07/16 54  02/25/16 63    Renal function: CrCl cannot be calculated (Patient has no serum creatinine result on file.).  Past Medical History  Diagnosis Date  . Hypertension   . Diabetes mellitus without complication (HCC)   . Hypercholesteremia   . Stroke (HCC) 5 6 years ago    mini stroke   . Essential hypertension 02/25/2016  . Hyperlipidemia 02/25/2016  . Shortness of breath 02/25/2016  . Obesity (BMI 30-39.9) 02/25/2016    Current Outpatient Prescriptions on File Prior to Visit  Medication Sig Dispense Refill  . albuterol (PROVENTIL HFA;VENTOLIN HFA) 108 (90 BASE) MCG/ACT inhaler Inhale 1-2 puffs into the lungs every 6 (six) hours as needed for wheezing or shortness of breath.    [name]. amLODipine (NORVASC) 10 MG tablet Take 10 mg by mouth at bedtime.     [name]. atorvastatin (LIPITOR) 40 MG tablet Take 40 mg by mouth at bedtime.     .  clopidogrel (PLAVIX) 75 MG tablet Take 75 mg by mouth daily with breakfast.    . ferrous sulfate 325 (65 FE) MG tablet Take 325 mg by mouth daily with breakfast.    . Fish Oil-Cholecalciferol (FISH OIL + D3 PO) Take 1 capsule by mouth every morning.     . loratadine (CLARITIN) 10 MG tablet Take 10 mg by mouth every morning.     [name]. losartan-hydrochlorothiazide (HYZAAR) 100-25 MG tablet Take 1 tablet by mouth daily. 90 tablet 3  . metoprolol succinate (TOPROL XL) 25 MG 24 hr tablet Take 1 tablet (25 mg total) by mouth daily. 90 tablet 3  . Multiple Vitamin (MULTI VITAMIN MENS PO) Take 1 tablet by mouth every evening.     . pantoprazole (PROTONIX) 20 MG tablet Take 20 mg by mouth at bedtime.     [name]. Polyethyl Glycol-Propyl Glycol (SYSTANE OP) Apply 1 drop to eye at bedtime.    [name]. testosterone (ANDROGEL) 50 MG/5GM GEL Place 5 g onto the skin every morning.     [name]. ibuprofen (ADVIL) 200 MG tablet Take 400 mg by mouth every 6 (six) hours as needed for moderate pain. Reported on 04/10/2016     No current  facility-administered medications on file prior to visit.    Allergies  Allergen Reactions  . Penicillins     unknown  . Sulfur Hives    In genital area accompanied by blisters     Assessment/Plan: Hypertension: BP is not at goal but he just started metoprolol 2 days ago. Will have him continue to monitor blood pressure and bring cuff and log to next visit in 1 month. May need additional agent at that time or to titrate metoprolol dose if BP remains elevated.   Thank you, Freddie Apley. Cleatis Polka, PharmD  [facility]

## 2016-04-10 NOTE — Assessment & Plan Note (Signed)
BP is not at goal but he just started metoprolol 2 days ago. Will have him continue to monitor blood pressure and bring cuff and log to next visit in 1 month. May need additional agent at that time or to titrate metoprolol dose if BP remains elevated.

## 2016-04-10 NOTE — Patient Instructions (Addendum)
Return for a a follow up appointment in 1 month  Your blood pressure today is 158/89 (goal <140/90)  Check your blood pressure at home daily (if able) and keep record of the readings.  Take your BP meds as follows: Continue same medications - may need to increase metoprolol dose at next visit.   Bring all of your meds, your BP cuff and your record of home blood pressures to your next appointment.  Exercise as you're able, try to walk approximately 30 minutes per day.  Keep salt intake to a minimum, especially watch canned and prepared boxed foods.  Eat more fresh fruits and vegetables and fewer canned items.  Avoid eating in fast food restaurants.    HOW TO TAKE YOUR BLOOD PRESSURE: . Rest 5 minutes before taking your blood pressure. .  Don't smoke or drink caffeinated beverages for at least 30 minutes before. . Take your blood pressure before (not after) you eat. . Sit comfortably with your back supported and both feet on the floor (don't cross your legs). . Elevate your arm to heart level on a table or a desk. . Use the proper sized cuff. It should fit smoothly and snugly around your bare upper arm. There should be enough room to slip a fingertip under the cuff. The bottom edge of the cuff should be 1 inch above the crease of the elbow. . Ideally, take 3 measurements at one sitting and record the average.

## 2016-05-08 ENCOUNTER — Ambulatory Visit (INDEPENDENT_AMBULATORY_CARE_PROVIDER_SITE_OTHER): Payer: Medicare Other | Admitting: Pharmacist

## 2016-05-08 ENCOUNTER — Encounter: Payer: Self-pay | Admitting: Pharmacist

## 2016-05-08 VITALS — BP 158/78 | HR 51 | Wt 236.4 lb

## 2016-05-08 DIAGNOSIS — I10 Essential (primary) hypertension: Secondary | ICD-10-CM

## 2016-05-08 MED ORDER — VALSARTAN-HYDROCHLOROTHIAZIDE 320-25 MG PO TABS: 1.0000 | ORAL_TABLET | Freq: Every day | ORAL | Status: DC

## 2016-05-08 NOTE — Progress Notes (Signed)
Patient ID: Jeff KidneyUdell L Bullis                 DOB: October 12, 1936                      MRN: [id number]     HPI: Jeff Watkins is a 80 y.o. male patient of Dr. Duke Salviaandolph here for blood pressure follow-up. He reports he has been feeling fine since our last visit.   He cancelled his appointment for sleep study because he does not wish to use a CPAP machine. He states they are too noisy and do not allow him to sleep. We discussed that the CPAP may also have some benefit on his blood pressure. He stated he would consider this and decide if he wished to proceed with the study.   Cardiac Hx: OSA, HTN, PVC  Current HTN meds:  Amlodipine 10mg  in the evening Losartan HCT 100/25mg  in the morning - he just got a 90 day supply  Metoprolol XL 25mg  in the evening  BP goal: <150/90  Social History: He smoked casually in the past about 60 years ago. He denies smokeless tobacco and alcohol.   Diet: He reports that right now he has been eating a lot of vegetables from his garden. He has tried to cut back on calories but he has not quite done enough he says. He admits to adding salt to his food and reports eating a lot of "hidden sodium." He has been eating more of his meals out at restaurants such as [employer] and fast food.   He endorses drinking 2 cups of coffee a day and decaf tea, but states otherwise he drinks a lot of water.   Exercise: He admits that he has not been as active as he should. He has been talking to a gym that does silver sneakers and has plans to get his bike fixed. We discussed working on increasing he cardio slowly. He will try to work on exercising for 20 minutes each week until he returns for his visit.   Home BP readings:  In the last month he has only had 2 readings <140 systolic. The majority of his blood pressure readings are in the 150s-160s systolic. He has not had a single diastolic reading >86>90.   He reports his HR usually runs in the 50s.    Wt Readings from Last 3 Encounters:    04/07/16 238 lb 6.4 oz (108.138 kg)  03/19/16 236 lb (107.049 kg)  02/25/16 236 lb (107.049 kg)   BP Readings from Last 3 Encounters:  04/10/16 158/78  04/07/16 134/68  02/25/16 161/81   Pulse Readings from Last 3 Encounters:  04/10/16 55  04/07/16 54  02/25/16 63    Renal function: CrCl cannot be calculated (Unknown ideal weight.).  Past Medical History  Diagnosis Date  . Hypertension   . Diabetes mellitus without complication (HCC)   . Hypercholesteremia   . Stroke (HCC) 5 6 years ago    mini stroke   . Essential hypertension 02/25/2016  . Hyperlipidemia 02/25/2016  . Shortness of breath 02/25/2016  . Obesity (BMI 30-39.9) 02/25/2016    Current Outpatient Prescriptions on File Prior to Visit  Medication Sig Dispense Refill  . albuterol (PROVENTIL HFA;VENTOLIN HFA) 108 (90 BASE) MCG/ACT inhaler Inhale 1-2 puffs into the lungs every 6 (six) hours as needed for wheezing or shortness of breath.    [name]. amLODipine (NORVASC) 10 MG tablet Take 10 mg by mouth at  bedtime.     [name] atorvastatin (LIPITOR) 40 MG tablet Take 40 mg by mouth at bedtime.     . clopidogrel (PLAVIX) 75 MG tablet Take 75 mg by mouth daily with breakfast.    . ferrous sulfate 325 (65 FE) MG tablet Take 325 mg by mouth daily with breakfast.    . Fish Oil-Cholecalciferol (FISH OIL + D3 PO) Take 1 capsule by mouth every morning.     [name] ibuprofen (ADVIL) 200 MG tablet Take 400 mg by mouth every 6 (six) hours as needed for moderate pain. Reported on 04/10/2016    . loratadine (CLARITIN) 10 MG tablet Take 10 mg by mouth every morning.     [name] losartan-hydrochlorothiazide (HYZAAR) 100-25 MG tablet Take 1 tablet by mouth daily. 90 tablet 3  . metoprolol succinate (TOPROL XL) 25 MG 24 hr tablet Take 1 tablet (25 mg total) by mouth daily. 90 tablet 3  . Multiple Vitamin (MULTI VITAMIN MENS PO) Take 1 tablet by mouth every evening.     . pantoprazole (PROTONIX) 20 MG tablet Take 20 mg by mouth at bedtime.     [name]  Glycol-Propyl Glycol (SYSTANE OP) Apply 1 drop to eye at bedtime.    [name] testosterone (ANDROGEL) 50 MG/5GM GEL Place 5 g onto the skin every morning.      No current facility-administered medications on file prior to visit.    Allergies  Allergen Reactions  . Penicillins     unknown  . Sulfur Hives    In genital area accompanied by blisters     Assessment/Plan: Hypertension: BP is not at goal and has remained elevated at home. His HR is low-normal and he reports similar HRs at home so we will avoid increasing his betablocker. He is wishing to avoid adding another pill to his regimen. Therefore, will have him change losartan HCT for valsartan HCT as usually this medication is a little more potent at decreasing the blood pressure. Since he just received a 90 supply will have him hold on to this medication incase he is unable to tolerate valsartan. Will have him follow up in 4 weeks.   Thank you, Freddie Apley. Cleatis Polka, PharmD  [facility]  05/08/2016 8:57 AM

## 2016-05-08 NOTE — Assessment & Plan Note (Signed)
BP is not at goal and has remained elevated at home. His HR is low-normal and he reports similar HRs at home so we will avoid increasing his betablocker. He is wishing to avoid adding another pill to his regimen. Therefore, will have him change losartan HCT for valsartan HCT as usually this medication is a little more potent at decreasing the blood pressure. Since he just received a 90 supply will have him hold on to this medication incase he is unable to tolerate valsartan. Will have him follow up in 4 weeks.

## 2016-05-08 NOTE — Patient Instructions (Signed)
Return for a a follow up appointment in 1 month  Your blood pressure today is 158/78   Check your blood pressure at home daily (if able) and keep record of the readings.  Take your BP meds as follows: Stop taking losartan HCTZ  Start taking valsartan HCT  Bring all of your meds, your BP cuff and your record of home blood pressures to your next appointment.  Exercise as you're able, try to walk approximately 30 minutes per day.  Keep salt intake to a minimum, especially watch canned and prepared boxed foods.  Eat more fresh fruits and vegetables and fewer canned items.  Avoid eating in fast food restaurants.    HOW TO TAKE YOUR BLOOD PRESSURE: . Rest 5 minutes before taking your blood pressure. .  Don't smoke or drink caffeinated beverages for at least 30 minutes before. . Take your blood pressure before (not after) you eat. . Sit comfortably with your back supported and both feet on the floor (don't cross your legs). . Elevate your arm to heart level on a table or a desk. . Use the proper sized cuff. It should fit smoothly and snugly around your bare upper arm. There should be enough room to slip a fingertip under the cuff. The bottom edge of the cuff should be 1 inch above the crease of the elbow. . Ideally, take 3 measurements at one sitting and record the average.

## 2016-05-18 ENCOUNTER — Encounter (HOSPITAL_BASED_OUTPATIENT_CLINIC_OR_DEPARTMENT_OTHER): Payer: Medicare Other

## 2016-06-12 ENCOUNTER — Ambulatory Visit (INDEPENDENT_AMBULATORY_CARE_PROVIDER_SITE_OTHER): Payer: Medicare Other | Admitting: Pharmacist

## 2016-06-12 VITALS — BP 162/76 | HR 53 | Wt 238.0 lb

## 2016-06-12 DIAGNOSIS — I10 Essential (primary) hypertension: Secondary | ICD-10-CM

## 2016-06-12 MED ORDER — PANTOPRAZOLE SODIUM 20 MG PO TBEC: 20.0000 mg | DELAYED_RELEASE_TABLET | Freq: Every day | ORAL | 5 refills | Status: DC

## 2016-06-12 MED ORDER — HYDRALAZINE HCL 25 MG PO TABS: 25.0000 mg | ORAL_TABLET | Freq: Two times a day (BID) | ORAL | 0 refills | Status: DC

## 2016-06-12 NOTE — Progress Notes (Signed)
Patient ID: Jeff KidneyUdell L Crepeau                 DOB: [birth year]                      MRN: [id number]     53HPI: Jeff Watkins is a 80 y.o. male patient of Dr. Duke Salviaandolph here for blood pressure follow-up.    He previously cancelled his appointment for sleep study because he does not wish to use a CPAP machine. He states they are too noisy and do not allow him to sleep. We discussed that the CPAP may also have some benefit on his blood pressure. He stated he would consider this and decide if he wished to proceed with the study.   He states he has felt fine since his last visit with our office. Denies dizziness or other orthostatic issues. He does notice that it is urgent to go to the bathroom when he stands and he believes that this is related to the diuretic.   He states he has been taking his wifes omeprazole rather than his pantoprazole just to use up her supply because she no longer needs it. He is on plavix so will have him go back to pantoprazole to prevent potential drug interaction.   Cardiac Hx: OSA, HTN, PVC  Current HTN meds:  Amlodipine 10mg  in the evening Valsartan HCT 320/25mg  in the morning  Metoprolol XL 25mg  in the evening Hydralazine 25mg  BID  BP goal: <150/90  Social History: He smoked casually in the past about 60 years ago. He denies smokeless tobacco and alcohol.   Diet: He reports that right now he has been eating a lot of vegetables from his garden. He has tried to cut back on calories but he has not quite done enough he says. He admits to adding salt to his food and reports eating a lot of "hidden sodium." He has been eating more of his meals out at restaurants such as [employer] and fast food.   He endorses drinking 2 cups of coffee a day and decaf tea, but states otherwise he drinks a lot of water.   Exercise: He admits that he has not been as active as he should. He has been talking to a gym that does silver sneakers and has plans to get his bike fixed. We  discussed working on increasing he cardio slowly. He will try to work on exercising for 20 minutes each week until he returns for his visit.   Home BP readings:  Overall his pressures run generally 130-150s/60-70s There are 6 readings on his list >160 (2 of them >170) over the last 3 weeks.   Wt Readings from Last 3 Encounters:  06/12/16 238 lb (108 kg)  05/08/16 236 lb 6.4 oz (107.2 kg)  04/07/16 238 lb 6.4 oz (108.1 kg)   BP Readings from Last 3 Encounters:  06/12/16 (!) 162/76  05/08/16 (!) 158/78  04/10/16 (!) 158/78   Pulse Readings from Last 3 Encounters:  06/12/16 (!) 53  05/08/16 (!) 51  04/10/16 (!) 55    Renal function: CrCl cannot be calculated (Patient's most recent lab result is older than the maximum 21 days allowed.).  Past Medical History:  Diagnosis Date  . Diabetes mellitus without complication (HCC)   . Essential hypertension 02/25/2016  . Hypercholesteremia   . Hyperlipidemia 02/25/2016  . Hypertension   . Obesity (BMI 30-39.9) 02/25/2016  . Shortness of breath 02/25/2016  . Stroke (  HCC) 5 6 years ago   mini stroke     Current Outpatient Prescriptions on File Prior to Visit  Medication Sig Dispense Refill  . albuterol (PROVENTIL HFA;VENTOLIN HFA) 108 (90 BASE) MCG/ACT inhaler Inhale 1-2 puffs into the lungs every 6 (six) hours as needed for wheezing or shortness of breath. Reported on 05/08/2016    . amLODipine (NORVASC) 10 MG tablet Take 10 mg by mouth at bedtime.     [name]. atorvastatin (LIPITOR) 40 MG tablet Take 40 mg by mouth at bedtime.     . clopidogrel (PLAVIX) 75 MG tablet Take 75 mg by mouth daily with breakfast.    . ferrous sulfate 325 (65 FE) MG tablet Take 325 mg by mouth daily with breakfast.    . Fish Oil-Cholecalciferol (FISH OIL + D3 PO) Take 1 capsule by mouth every morning.     . loratadine (CLARITIN) 10 MG tablet Take 10 mg by mouth every morning.     . metoprolol succinate (TOPROL XL) 25 MG 24 hr tablet Take 1 tablet (25 mg total) by mouth  daily. 90 tablet 3  . Multiple Vitamin (MULTI VITAMIN MENS PO) Take 1 tablet by mouth every evening.     [name]. Polyethyl Glycol-Propyl Glycol (SYSTANE OP) Apply 1 drop to eye at bedtime.    [name]. testosterone (ANDROGEL) 50 MG/5GM GEL Place 5 g onto the skin every morning.     . valsartan-hydrochlorothiazide (DIOVAN-HCT) 320-25 MG tablet Take 1 tablet by mouth daily. 30 tablet 3  . ibuprofen (ADVIL) 200 MG tablet Take 400 mg by mouth every 6 (six) hours as needed for moderate pain. Reported on 05/08/2016     No current facility-administered medications on file prior to visit.     Allergies  Allergen Reactions  . Penicillins     unknown  . Sulfur Hives    In genital area accompanied by blisters    Blood pressure (!) 162/76, pulse (!) 53, weight 238 lb (108 kg).   Assessment/Plan: Hypertension: BP is not at goal. Will add hydralazine 25mg  BID. Continue to monitor and record pressures. Follow up in hypertension clinic in 3 weeks.   Thank you, Freddie ApleyKelley M. Cleatis PolkaAuten, PharmD  [facility]  06/18/2016 7:18 AM

## 2016-06-12 NOTE — Patient Instructions (Signed)
Return for a a follow up appointment in 3 weeks  Check your blood pressure at home daily (if able) and keep record of the readings.  Take your BP meds as follows: Start taking hydralazine 25mg  twice daily   Continue monitoring your pressures.    Bring all of your meds, your BP cuff and your record of home blood pressures to your next appointment.  Exercise as you're able, try to walk approximately 30 minutes per day.  Keep salt intake to a minimum, especially watch canned and prepared boxed foods.  Eat more fresh fruits and vegetables and fewer canned items.  Avoid eating in fast food restaurants.    HOW TO TAKE YOUR BLOOD PRESSURE: . Rest 5 minutes before taking your blood pressure. .  Don't smoke or drink caffeinated beverages for at least 30 minutes before. . Take your blood pressure before (not after) you eat. . Sit comfortably with your back supported and both feet on the floor (don't cross your legs). . Elevate your arm to heart level on a table or a desk. . Use the proper sized cuff. It should fit smoothly and snugly around your bare upper arm. There should be enough room to slip a fingertip under the cuff. The bottom edge of the cuff should be 1 inch above the crease of the elbow. . Ideally, take 3 measurements at one sitting and record the average.

## 2016-06-18 ENCOUNTER — Encounter: Payer: Self-pay | Admitting: Pharmacist

## 2016-07-03 ENCOUNTER — Encounter: Payer: Self-pay | Admitting: Pharmacist Clinician (PhC)/ Clinical Pharmacy Specialist

## 2016-07-03 ENCOUNTER — Ambulatory Visit (INDEPENDENT_AMBULATORY_CARE_PROVIDER_SITE_OTHER): Payer: Medicare Other | Admitting: Pharmacist Clinician (PhC)/ Clinical Pharmacy Specialist

## 2016-07-03 VITALS — BP 144/68 | HR 60 | Ht 68.0 in | Wt 230.0 lb

## 2016-07-03 DIAGNOSIS — I10 Essential (primary) hypertension: Secondary | ICD-10-CM

## 2016-07-03 MED ORDER — HYDRALAZINE HCL 25 MG PO TABS: 25.0000 mg | ORAL_TABLET | Freq: Two times a day (BID) | ORAL | 1 refills | Status: DC

## 2016-07-03 NOTE — Patient Instructions (Signed)
Call if you notice your BP increasing to >140 on a regular basis  Your blood pressure today is 144/68 (goal is <140/90)  Check your blood pressure at home daily and keep record of the readings.  Take your BP meds as follows:  Continue with all your current medications  Bring all of your meds, your BP cuff and your record of home blood pressures to your next appointment.  Exercise as you're able, try to walk approximately 30 minutes per day.  Keep salt intake to a minimum, especially watch canned and prepared boxed foods.  Eat more fresh fruits and vegetables and fewer canned items.  Avoid eating in fast food restaurants.    HOW TO TAKE YOUR BLOOD PRESSURE: . Rest 5 minutes before taking your blood pressure. .  Don't smoke or drink caffeinated beverages for at least 30 minutes before. . Take your blood pressure before (not after) you eat. . Sit comfortably with your back supported and both feet on the floor (don't cross your legs). . Elevate your arm to heart level on a table or a desk. . Use the proper sized cuff. It should fit smoothly and snugly around your bare upper arm. There should be enough room to slip a fingertip under the cuff. The bottom edge of the cuff should be 1 inch above the crease of the elbow. . Ideally, take 3 measurements at one sitting and record the average.

## 2016-07-03 NOTE — Progress Notes (Signed)
Patient ID: Jeff KidneyUdell L Siegel                 DOB: [birth year]                      MRN: [id number]     53HPI: Jeff Watkins is a 80 y.o. male patient of Dr. Duke Salviaandolph here for blood pressure follow-up.    It was suggested that he have a sleep study done, but he reports that he went several years ago, lasted until they put the CPAP on, then he became claustrophobic and went home.  Has no interest in pursing.    He states he has felt fine since his last visit with our office. Denies dizziness or other orthostatic issues.   Cardiac Hx: OSA, HTN, PVC, DM  Current HTN meds:  Amlodipine 10mg  in the evening Valsartan HCT 320/25mg  in the morning  Metoprolol XL 25mg  in the evening Hydralazine 25mg  BID  BP goal: <140/90  Social History: He smoked casually in the past about 60 years ago. He denies smokeless tobacco and alcohol.   Diet: He eats mostly fresh vegetables (from his garden), tries to avoid salt, is only using when cooking, no longer at table.  He has previously noted increased edema with excessive salt intake.    He endorses drinking 2 cups of coffee a day and decaf tea, but states otherwise he drinks a lot of water.   Exercise: He admits that he has not been as active as he should. He has been talking to a gym that does silver sneakers and has plans to get his bike fixed. We discussed working on increasing he cardio slowly. He will try to work on exercising for 20 minutes each week until he returns for his visit.   Home BP readings:  Home readings average 133/64 with range of 118-149.  Only 4 readings > 140.     Wt Readings from Last 3 Encounters:  07/03/16 230 lb (104.3 kg)  06/12/16 238 lb (108 kg)  05/08/16 236 lb 6.4 oz (107.2 kg)   BP Readings from Last 3 Encounters:  07/03/16 (!) 144/68  06/12/16 (!) 162/76  05/08/16 (!) 158/78   Pulse Readings from Last 3 Encounters:  07/03/16 60  06/12/16 (!) 53  05/08/16 (!) 51    Renal function: CrCl cannot be calculated  (Patient's most recent lab result is older than the maximum 21 days allowed.).  Past Medical History:  Diagnosis Date  . Diabetes mellitus without complication (HCC)   . Essential hypertension 02/25/2016  . Hypercholesteremia   . Hyperlipidemia 02/25/2016  . Hypertension   . Obesity (BMI 30-39.9) 02/25/2016  . Shortness of breath 02/25/2016  . Stroke [facility](HCC) 5 6 years ago   mini stroke     Current Outpatient Prescriptions on File Prior to Visit  Medication Sig Dispense Refill  . albuterol (PROVENTIL HFA;VENTOLIN HFA) 108 (90 BASE) MCG/ACT inhaler Inhale 1-2 puffs into the lungs every 6 (six) hours as needed for wheezing or shortness of breath. Reported on 05/08/2016    . amLODipine (NORVASC) 10 MG tablet Take 10 mg by mouth at bedtime.     [name]. atorvastatin (LIPITOR) 40 MG tablet Take 40 mg by mouth at bedtime.     . clopidogrel (PLAVIX) 75 MG tablet Take 75 mg by mouth daily with breakfast.    . ferrous sulfate 325 (65 FE) MG tablet Take 325 mg by mouth daily with breakfast.    . Fish  Oil-Cholecalciferol (FISH OIL + D3 PO) Take 1 capsule by mouth every morning.     . hydrALAZINE (APRESOLINE) 25 MG tablet Take 1 tablet (25 mg total) by mouth 2 (two) times daily. 60 tablet 0  . ibuprofen (ADVIL) 200 MG tablet Take 400 mg by mouth every 6 (six) hours as needed for moderate pain. Reported on 05/08/2016    . loratadine (CLARITIN) 10 MG tablet Take 10 mg by mouth every morning.     . metoprolol succinate (TOPROL XL) 25 MG 24 hr tablet Take 1 tablet (25 mg total) by mouth daily. 90 tablet 3  . Multiple Vitamin (MULTI VITAMIN MENS PO) Take 1 tablet by mouth every evening.     . pantoprazole (PROTONIX) 20 MG tablet Take 1 tablet (20 mg total) by mouth daily. 30 tablet 5  . Polyethyl Glycol-Propyl Glycol (SYSTANE OP) Apply 1 drop to eye at bedtime.    [name] testosterone (ANDROGEL) 50 MG/5GM GEL Place 5 g onto the skin every morning.     . valsartan-hydrochlorothiazide (DIOVAN-HCT) 320-25 MG tablet Take 1 tablet  by mouth daily. 30 tablet 3   No current facility-administered medications on file prior to visit.     Allergies  Allergen Reactions  . Penicillins     unknown  . Sulfur Hives    In genital area accompanied by blisters    Blood pressure (!) 144/68, pulse 60, height 5\' 8"  (1.727 m), weight 230 lb (104.3 kg).   Assessment/Plan: Blood pressure looks well controlled at home and not significantly above goal in the office today.  Patient to continue with daily monitoring and inform us should his systolic rise to consistently > 140.     Thank you, Phillips Hay PharmD CPP  [facility]  07/03/2016 1:44 PM

## 2016-09-01 ENCOUNTER — Other Ambulatory Visit: Payer: Self-pay | Admitting: *Deleted

## 2016-09-01 MED ORDER — VALSARTAN-HYDROCHLOROTHIAZIDE 320-25 MG PO TABS: 1.0000 | ORAL_TABLET | Freq: Every day | ORAL | 6 refills | Status: DC

## 2016-09-01 NOTE — Telephone Encounter (Signed)
Rx has been sent to the pharmacy electronically. ° °

## 2016-11-05 IMAGING — NM NM MISC PROCEDURE
6 series · 36 of 36 positions shown · non-contrast
Comparison: none

[Series 1: wbr rest · 6.40mm/px · 6 of 64 frames shown]
[frame 6/64]
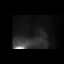
[frame 16/64]
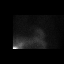
[frame 27/64]
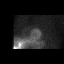
[frame 38/64]
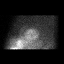
[frame 48/64]
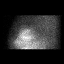
[frame 59/64]
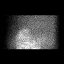

[Series 1: wbr_r-proj_st wbr rest · 6.40mm/px · 6 of 64 frames shown]
[frame 6/64]
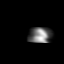
[frame 16/64]
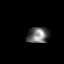
[frame 27/64]
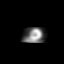
[frame 38/64]
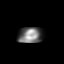
[frame 48/64]
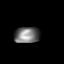
[frame 59/64]
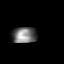

[Series 2: wbr_s-proj_st wbr stress-gsp · 6.40mm/px · 6 of 512 frames shown]
[frame 43/512]
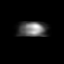
[frame 128/512]
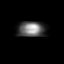
[frame 214/512]
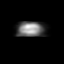
[frame 299/512]
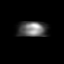
[frame 384/512]
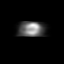
[frame 470/512]
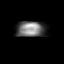

[Series 2: wbr stress-gsp · 6.40mm/px · 6 of 510 frames shown]
[frame 43/510]
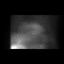
[frame 128/510]
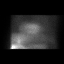
[frame 213/510]
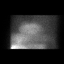
[frame 298/510]
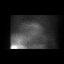
[frame 383/510]
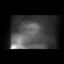
[frame 468/510]
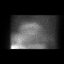

[Series 3: wbr stress-sum-em · 6.40mm/px · 6 of 64 frames shown]
[frame 6/64]
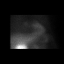
[frame 16/64]
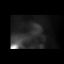
[frame 27/64]
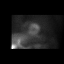
[frame 38/64]
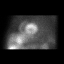
[frame 48/64]
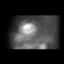
[frame 59/64]
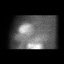

[Series 3: wbr_s-proj_st wbr stress-sum-em · 6.40mm/px · 6 of 64 frames shown]
[frame 6/64]
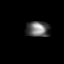
[frame 16/64]
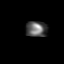
[frame 27/64]
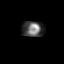
[frame 38/64]
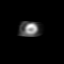
[frame 48/64]
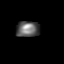
[frame 59/64]
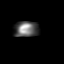

[36 of 36 positions shown; findings below may reference images not displayed]

Canned report from images found in remote index.

Refer to host system for actual result text.

## 2016-11-27 ENCOUNTER — Other Ambulatory Visit: Payer: Self-pay | Admitting: Cardiovascular Disease

## 2016-11-27 NOTE — Telephone Encounter (Signed)
Refill Request.  

## 2017-01-17 ENCOUNTER — Other Ambulatory Visit: Payer: Self-pay | Admitting: Cardiovascular Disease

## 2017-01-19 NOTE — Telephone Encounter (Signed)
Please review for refill. Thanks!  

## 2017-03-09 ENCOUNTER — Ambulatory Visit
Admission: RE | Admit: 2017-03-09 | Discharge: 2017-03-09 | Disposition: A | Discharge status: Home / Self Care | Payer: Medicare Other | Source: Ambulatory Visit | Attending: Internal Medicine | Admitting: Internal Medicine

## 2017-03-09 ENCOUNTER — Other Ambulatory Visit: Payer: Self-pay | Admitting: Internal Medicine

## 2017-03-09 DIAGNOSIS — M4722 Other spondylosis with radiculopathy, cervical region: Secondary | ICD-10-CM

## 2017-03-18 ENCOUNTER — Encounter: Payer: Self-pay | Admitting: Neurology

## 2017-03-20 ENCOUNTER — Other Ambulatory Visit: Payer: Self-pay | Admitting: Cardiovascular Disease

## 2017-03-20 NOTE — Telephone Encounter (Signed)
Please review for refill. Thanks!  

## 2017-03-20 NOTE — Telephone Encounter (Signed)
Rx has been sent to the pharmacy electronically. ° °

## 2017-06-10 ENCOUNTER — Ambulatory Visit (INDEPENDENT_AMBULATORY_CARE_PROVIDER_SITE_OTHER): Payer: Medicare Other | Admitting: Neurology

## 2017-06-10 ENCOUNTER — Encounter: Payer: Self-pay | Admitting: Neurology

## 2017-06-10 VITALS — BP 148/70 | HR 60 | Ht 68.0 in | Wt 245.2 lb

## 2017-06-10 DIAGNOSIS — G5603 Carpal tunnel syndrome, bilateral upper limbs: Secondary | ICD-10-CM | POA: Diagnosis not present

## 2017-06-10 NOTE — Progress Notes (Signed)
[facility] Clinic Note - Initial Visit   Date: 06/10/17  Jeff Watkins MRN: [id number] DOB: 1936/01/12   Dear Dr. Chales Salmon:   Thank you for your kind referral of Jeff Watkins for consultation of bilateral CTS. Although his history is well known to you, please allow [place] to reiterate it for the purpose of our medical record. The patient was accompanied to the clinic by self who also provides collateral information.     History of Present Illness: Jeff Watkins is a 81 y.o. right-handed Caucasian male with type 2 diabetes mellitus complicated by CKD, hypertension, asthma, CAD, right lacunar stroke (2008, manifesting with left sided weakness), RLS, GERD, and cervical degenerative spondylosis and foraminal stenosis presenting for evaluation of bilateral hand paresthesias.    Starting about 10 years ago, he noticed numbness over the thumbs and index fingers, which is worse on the right.  This was more apparent when doing repetitive activity, such using his tiller and mowing.  Symptoms previously were intermittent and aggravated by activity only.  Over the past year, he had more frequent spells of tingling and numbness, which is triggered by driving, sitting with his arms resting on his elbows, or driving.  It often wakes him up from sleeping and he is able to shake his hands awake. He is not dropping objects and is able to open bottles and jars without difficulty.  He had not used any wrist splints and is not keen on taking any medications for this.  NCS/EMG of the upper extremities performed on 03/12/2017 by Dr. Neale Burly at [facility] showed chronic severe right and moderate left CTS.    He had a accident in 2001 and was recommended to have surgery for herniated disc, but due to insurance reasons, he did not have this performed.  Occasionally, he has neck pain but this is not associated with his hand paresthesias.    He suffered a right radius and ulnar  compound fracture about 60 years ago and had it casted for 9 months and therefore adjusted to use his left hand a lot.   Out-side paper records, electronic medical record, and images have been reviewed where available and summarized as:  Labs:  HbA1c 6.4  MRI cervical spine wo contrast 08/05/1999:  DEGENERATIVE CERVICAL SPONDYLOTIC CHANGES. BIFORAMINAL STENOSIS IS PRESENT AT C4-5 AND AT C5-6.  CERVICAL KYPHOSIS  MRA head 10/17/2007:  Right M1 stenosis  MRI brain 10/17/2007:    1. Acute 3mm stroke in the right parietal cortical to subcortical region.   2. Chronic small-vessel changes elsewhere in the cerebellum and hemispheric white matter.  NCS/EMG of the upper extremities performed on 03/12/2017 by Dr. Neale Burly at [facility] showed chronic severe right and moderate left CTS.   Past Medical History:  Diagnosis Date  . Diabetes mellitus without complication (HCC)   . Essential hypertension 02/25/2016  . Hypercholesteremia   . Hyperlipidemia 02/25/2016  . Hypertension   . Obesity (BMI 30-39.9) 02/25/2016  . Shortness of breath 02/25/2016  . Stroke (HCC) 5 6 years ago   mini stroke     Past Surgical History:  Procedure Laterality Date  . COLONOSCOPY WITH PROPOFOL N/A 05/21/2015   Procedure: COLONOSCOPY WITH PROPOFOL;  Surgeon: [name], MD;  Location: [facility];  Service: Endoscopy;  Laterality: N/A;  . FRACTURE SURGERY  1966   compound arm  . HERNIA REPAIR    . LAPAROSCOPIC APPENDECTOMY N/A 10/18/2013   Procedure: APPENDECTOMY LAPAROSCOPIC;  Surgeon: [name],  MD;  Location: [facility];  Service: General;  Laterality: N/A;  . TESTICLE REMOVAL  1965     Medications:  Outpatient Encounter Prescriptions as of 06/10/2017  Medication Sig  . albuterol (PROVENTIL HFA;VENTOLIN HFA) 108 (90 BASE) MCG/ACT inhaler Inhale 1-2 puffs into the lungs every 6 (six) hours as needed for wheezing or shortness of breath. Reported on 05/08/2016  . amLODipine (NORVASC) 10 MG tablet Take 10  mg by mouth at bedtime.   [name] atorvastatin (LIPITOR) 40 MG tablet Take 40 mg by mouth at bedtime.   . clopidogrel (PLAVIX) 75 MG tablet Take 75 mg by mouth daily with breakfast.  . ferrous sulfate 325 (65 FE) MG tablet Take 325 mg by mouth daily with breakfast.  . Fish Oil-Cholecalciferol (FISH OIL + D3 PO) Take 1 capsule by mouth every morning.   . hydrALAZINE (APRESOLINE) 25 MG tablet Take 1 tablet (25 mg total) by mouth 2 (two) times daily.  [name] ibuprofen (ADVIL) 200 MG tablet Take 400 mg by mouth every 6 (six) hours as needed for moderate pain. Reported on 05/08/2016  . loratadine (CLARITIN) 10 MG tablet Take 10 mg by mouth every morning.   . metoprolol succinate (TOPROL-XL) 25 MG 24 hr tablet TAKE ONE (1) TABLET EACH DAY  . Multiple Vitamin (MULTI VITAMIN MENS PO) Take 1 tablet by mouth every evening.   . pantoprazole (PROTONIX) 20 MG tablet TAKE ONE (1) TABLET EACH DAY  . Polyethyl Glycol-Propyl Glycol (SYSTANE OP) Apply 1 drop to eye at bedtime.  [name] testosterone (ANDROGEL) 50 MG/5GM GEL Place 5 g onto the skin every morning.   . valsartan-hydrochlorothiazide (DIOVAN-HCT) 320-25 MG tablet Take 1 tablet by mouth daily.   No facility-administered encounter medications on file as of 06/10/2017.      Allergies:  Allergies  Allergen Reactions  . Penicillins     unknown  . Sulfur Hives    In genital area accompanied by blisters    Family History: Family History  Problem Relation Age of Onset  . Heart disease Father   . Stroke Father   . Cancer Brother        colon  . Other Son        Paraplegic from motorcycle accident  . HIV/AIDS Daughter 63    Social History: Social History  Substance Use Topics  . Smoking status: Former Smoker    Quit date: 10/27/1960  . Smokeless tobacco: Never Used  . Alcohol use No   Social History   Social History Narrative   Lives with wife in a one story home.  Has one son.     Retired Games developer for [employer]     Education: some college.      Review of Systems:  CONSTITUTIONAL: No fevers, chills, night sweats, or weight loss.   EYES: No visual changes or eye pain ENT: No hearing changes.  No history of nose bleeds.   RESPIRATORY: No cough, wheezing and shortness of breath.   CARDIOVASCULAR: Negative for chest pain, and palpitations.   GI: Negative for abdominal discomfort, blood in stools or black stools.  No recent change in bowel habits.   GU:  No history of incontinence.   MUSCLOSKELETAL: No history of joint pain or swelling.  No myalgias.   SKIN: Negative for lesions, rash, and itching.   HEMATOLOGY/ONCOLOGY: Negative for prolonged bleeding, bruising easily, and swollen nodes.  No history of cancer.   ENDOCRINE: Negative for cold or heat intolerance, polydipsia or goiter.   PSYCH:  No depression or anxiety symptoms.   NEURO: As Above.   Vital Signs:  BP (!) 148/70   Pulse 60   Ht 5\' 8"  (1.727 m)   Wt 245 lb 3 oz (111.2 kg)   SpO2 95%   BMI 37.28 kg/m    General Medical Exam:   General:  Well appearing, comfortable, appears much younger than stated age.   Eyes/ENT: see cranial nerve examination.   Neck: No masses appreciated.  Full range of motion without tenderness.  No carotid bruits. Respiratory:  Clear to auscultation, good air entry bilaterally.   Cardiac:  Regular rate and rhythm, no murmur.   Extremities:  Right forearm with soft tissue changes and old scars from previous injury.  Skin:  No rashes or lesions.  Neurological Exam: MENTAL STATUS including orientation to time, place, person, recent and remote memory, attention span and concentration, language, and fund of knowledge is normal.  Speech is not dysarthric.  CRANIAL NERVES: II:  No visual field defects.  Unremarkable fundi.   III-IV-VI: Pupils equal round and reactive to light.  Normal conjugate, extra-ocular eye movements in all directions of gaze.  No nystagmus.  No ptosis.   V:  Normal facial sensation.    VII:  Normal facial symmetry  and movements.  No pathologic facial reflexes.  VIII:  Normal hearing and vestibular function.   IX-X:  Normal palatal movement.   XI:  Normal shoulder shrug and head rotation.   XII:  Normal tongue strength and range of motion, no deviation or fasciculation.  MOTOR:  No atrophy, fasciculations or abnormal movements.  No pronator drift.  Tone is normal.    Right Upper Extremity:    Left Upper Extremity:    Deltoid  5/5   Deltoid  5/5   Biceps  5/5   Biceps  5/5   Triceps  5/5   Triceps  5/5   Wrist extensors  5/5   Wrist extensors  5/5   Wrist flexors  5/5   Wrist flexors  5/5   Finger extensors  5/5   Finger extensors  5/5   Finger flexors  5/5   Finger flexors  5/5   Dorsal interossei  5/5   Dorsal interossei  5/5   Abductor pollicis  5-/5   Abductor pollicis  5/5   Tone (Ashworth scale)  0  Tone (Ashworth scale)  0   Right Lower Extremity:    Left Lower Extremity:    Hip flexors  5/5   Hip flexors  5/5   Hip extensors  5/5   Hip extensors  5/5   Knee flexors  5/5   Knee flexors  5/5   Knee extensors  5/5   Knee extensors  5/5   Dorsiflexors  5/5   Dorsiflexors  5/5   Plantarflexors  5/5   Plantarflexors  5/5   Toe extensors  5/5   Toe extensors  5/5   Toe flexors  5/5   Toe flexors  5/5   Tone (Ashworth scale)  0  Tone (Ashworth scale)  0   MSRs:  Right                                                                 Left brachioradialis 2+  brachioradialis 2+  biceps 2+  biceps 2+  triceps 2+  triceps 2+  patellar 2+  patellar 2+  ankle jerk 2+  ankle jerk 2+  Hoffman no  Hoffman no  plantar response down  plantar response down   SENSORY:  Normal and symmetric perception of light touch, pinprick, vibration, and proprioception.  Negative Tinel's sign at the wrist bilaterally. Romberg's sign absent.   COORDINATION/GAIT: Normal finger-to- nose-finger and heel-to-shin.  Intact rapid alternating movements bilaterally.  Gait narrow based and stable.      IMPRESSION: Bilateral carpal tunnel syndrome, severe on the right as demonstrated by NCS/EMG which I reviewed.  He has trace weakness of the right ABP and sensation is intact.   I had extensive discussion with the patient regarding the mechanism and management options for CTS. We decided to pursue conservative therapy with using wrist splints and occupational therapy.  He does not have bothersome paresthesias to warrant any pharmacological intervention.  We discussed CTS release if symptoms do not improve and will need to be referred to a hand specialist for this.  Return to clinic in 3 months.   The duration of this appointment visit was 40 minutes of face-to-face time with the patient.  Greater than 50% of this time was spent in counseling, explanation of diagnosis, planning of further management, and coordination of care.   Thank you for allowing me to participate in patient's care.  If I can answer any additional questions, I would be pleased to do so.    Sincerely,    Ladavia Lindenbaum K. Allena Katz, DO

## 2017-06-10 NOTE — Patient Instructions (Addendum)
Start using a wrist splint during the night and during the day as needed  Try to avoid over flexing at the wrist  Start occupational therapy   Return to clinic in 3 months

## 2017-06-18 ENCOUNTER — Other Ambulatory Visit: Payer: Self-pay | Admitting: Cardiovascular Disease

## 2017-06-19 NOTE — Telephone Encounter (Signed)
Please review for refill, thanks ! 

## 2017-08-06 ENCOUNTER — Ambulatory Visit
Admission: RE | Admit: 2017-08-06 | Discharge: 2017-08-06 | Disposition: A | Discharge status: Home / Self Care | Payer: Medicare Other | Source: Ambulatory Visit | Attending: Internal Medicine | Admitting: Internal Medicine

## 2017-08-06 ENCOUNTER — Other Ambulatory Visit: Payer: Self-pay | Admitting: Internal Medicine

## 2017-08-06 DIAGNOSIS — R599 Enlarged lymph nodes, unspecified: Secondary | ICD-10-CM

## 2017-08-07 ENCOUNTER — Other Ambulatory Visit: Payer: Self-pay | Admitting: Internal Medicine

## 2017-08-07 DIAGNOSIS — R221 Localized swelling, mass and lump, neck: Secondary | ICD-10-CM

## 2017-08-12 ENCOUNTER — Other Ambulatory Visit: Payer: Medicare Other

## 2017-08-13 ENCOUNTER — Ambulatory Visit
Admission: RE | Admit: 2017-08-13 | Discharge: 2017-08-13 | Disposition: A | Discharge status: Home / Self Care | Payer: Medicare Other | Source: Ambulatory Visit | Attending: Internal Medicine | Admitting: Internal Medicine

## 2017-08-13 DIAGNOSIS — R221 Localized swelling, mass and lump, neck: Secondary | ICD-10-CM

## 2017-08-13 MED ORDER — IOPAMIDOL (ISOVUE-300) INJECTION 61%
75.0000 mL | Freq: Once | INTRAVENOUS | Status: AC | PRN
  Administered 2017-08-13: 75 mL via INTRAVENOUS

## 2017-08-19 ENCOUNTER — Telehealth: Payer: Self-pay | Admitting: Physician Assistant

## 2017-08-19 NOTE — Telephone Encounter (Signed)
08/19/2017 Received faxed referral packet from [facility] for upcoming appointment with Theodore Demarkhonda Barrett, PA on 08/21/2017.  [facility].  cbr

## 2017-08-20 ENCOUNTER — Ambulatory Visit (INDEPENDENT_AMBULATORY_CARE_PROVIDER_SITE_OTHER): Payer: Medicare Other | Admitting: Physician Assistant

## 2017-08-20 ENCOUNTER — Encounter: Payer: Self-pay | Admitting: Physician Assistant

## 2017-08-20 VITALS — BP 165/73 | HR 56 | Ht 68.0 in | Wt 246.0 lb

## 2017-08-20 DIAGNOSIS — E119 Type 2 diabetes mellitus without complications: Secondary | ICD-10-CM | POA: Diagnosis not present

## 2017-08-20 DIAGNOSIS — Z01810 Encounter for preprocedural cardiovascular examination: Secondary | ICD-10-CM | POA: Diagnosis not present

## 2017-08-20 DIAGNOSIS — E785 Hyperlipidemia, unspecified: Secondary | ICD-10-CM | POA: Diagnosis not present

## 2017-08-20 DIAGNOSIS — I2510 Atherosclerotic heart disease of native coronary artery without angina pectoris: Secondary | ICD-10-CM | POA: Diagnosis not present

## 2017-08-20 DIAGNOSIS — I10 Essential (primary) hypertension: Secondary | ICD-10-CM

## 2017-08-20 NOTE — Patient Instructions (Signed)
Medication Instructions: Your physician recommends that you continue on your current medications as directed. Please refer to the Current Medication list given to you today.  If you need a refill on your cardiac medications before your next appointment, please call your pharmacy.    Follow-Up: Your physician wants you to follow-up in 6 months with Dr. [place]. You will receive a reminder letter in the mail two months in advance. If you don't receive a letter, please call our office at [phone] to schedule this follow-up appointment.   Thank you for choosing [facility]!!      

## 2017-08-20 NOTE — Progress Notes (Signed)
Dictation #1 [id number]  [id number]    Cardiology Office Note    Date:  08/22/2017   ID:  Jeff Watkins, DOB 1936-06-22, MRN [id number]  PCP:  Lorenda Ishihara, MD  Cardiologist:  Dr. Duke Salvia  Chief Complaint  Patient presents with  . Pre-op Exam    Surgery Dr. Pollyann Kennedy ENT, excision of L submandibular mass.     History of Present Illness:  Jeff Watkins is a 81 y.o. male with PMH of CAD, HTN, HLD, OSA, GERD, and DM II. He had a remote cardiac catheterization on 06/24/2000 that showed borderline 50-70% obstructive lesion in LAD, no significant RCA or left circumflex lesion. Echocardiogram obtained on 03/20/2016 showed EF 55-60%, grade 2 DD, mild TR, peak PA pressure 35 mmHg. Myoview obtained on 03/19/2016 showed EF 59%, low risk perfusion study. He did have frequent PVCs during exercise, there was some question whether his frequent PVC was causing dyspnea on exertion. He exercised for 6 minutes on Bruce protocol and reached 5.4 METS. At that appointment, he his losartan/HCTZ was increased due to poorly controlled hypertension. He was diagnosed with obstructive sleep apnea 10-15 years ago but has been unable to tolerate the mask.  He presents today for preoperative clearance requested by Dr. Chales Salmon and ENT surgeon Dr. Pollyann Kennedy for excision of left submandibular mass and surrounding lymph nodes. He denies any recent chest discomfort or shortness of breath. He has some dyspnea with extreme exertion but not with daily activity. He does not have any lower extremity edema, orthopnea or PND. Given his recent ischemic workup and the lack of any significant changes in the symptom, he is cleared to proceed with surgery without any further workup. He can't hold Plavix for 7 days prior to the procedure and restart as soon as possible afterward was deemed low bleeding risk present surgeon.    Past Medical History:  Diagnosis Date  . Diabetes mellitus without complication (HCC)   . Essential  hypertension 02/25/2016  . Hypercholesteremia   . Hyperlipidemia 02/25/2016  . Hypertension   . Obesity (BMI 30-39.9) 02/25/2016  . Shortness of breath 02/25/2016  . Stroke (HCC) 5 6 years ago   mini stroke     Past Surgical History:  Procedure Laterality Date  . COLONOSCOPY WITH PROPOFOL N/A 05/21/2015   Procedure: COLONOSCOPY WITH PROPOFOL;  Surgeon: [name], MD;  Location: [facility];  Service: Endoscopy;  Laterality: N/A;  . FRACTURE SURGERY  1966   compound arm  . HERNIA REPAIR    . LAPAROSCOPIC APPENDECTOMY N/A 10/18/2013   Procedure: APPENDECTOMY LAPAROSCOPIC;  Surgeon: [name], MD;  Location: [facility];  Service: General;  Laterality: N/A;  . TESTICLE REMOVAL  1965    Current Medications: Outpatient Medications Prior to Visit  Medication Sig Dispense Refill  . albuterol (PROVENTIL HFA;VENTOLIN HFA) 108 (90 BASE) MCG/ACT inhaler Inhale 1-2 puffs into the lungs every 6 (six) hours as needed for wheezing or shortness of breath. Reported on 05/08/2016    . amLODipine (NORVASC) 10 MG tablet Take 10 mg by mouth at bedtime.     [name] atorvastatin (LIPITOR) 40 MG tablet Take 40 mg by mouth at bedtime.     . clopidogrel (PLAVIX) 75 MG tablet Take 75 mg by mouth daily with breakfast.    . ferrous sulfate 325 (65 FE) MG tablet Take 325 mg by mouth daily with breakfast.    . Fish Oil-Cholecalciferol (FISH OIL + D3 PO) Take 1 capsule by mouth every morning.     [name]  ibuprofen (ADVIL) 200 MG tablet Take 400 mg by mouth every 6 (six) hours as needed for moderate pain. Reported on 05/08/2016    . loratadine (CLARITIN) 10 MG tablet Take 10 mg by mouth every morning.     . metoprolol succinate (TOPROL-XL) 25 MG 24 hr tablet TAKE ONE (1) TABLET EACH DAY 90 tablet 1  . Multiple Vitamin (MULTI VITAMIN MENS PO) Take 1 tablet by mouth every evening.     . pantoprazole (PROTONIX) 20 MG tablet TAKE ONE (1) TABLET EACH DAY 30 tablet 0  . Polyethyl Glycol-Propyl Glycol (SYSTANE OP) Apply 1 drop to eye at  bedtime.    [name] testosterone (ANDROGEL) 50 MG/5GM GEL Place 5 g onto the skin every morning.     . valsartan-hydrochlorothiazide (DIOVAN-HCT) 320-25 MG tablet Take 1 tablet by mouth daily. 30 tablet 6  . hydrALAZINE (APRESOLINE) 25 MG tablet Take 1 tablet (25 mg total) by mouth 2 (two) times daily. (Patient not taking: Reported on 08/20/2017) 180 tablet 1   No facility-administered medications prior to visit.      Allergies:   Penicillins and Sulfur   Social History   Social History  . Marital status: Married    Spouse name: N/A  . Number of children: 1  . Years of education: 37   Occupational History  . retired    Social History Main Topics  . Smoking status: Former Smoker    Quit date: 10/27/1960  . Smokeless tobacco: Never Used  . Alcohol use No  . Drug use: No  . Sexual activity: Not Asked   Other Topics Concern  . None   Social History Narrative   Lives with wife in a one story home.  Has one son.     Retired Games developer for [employer]     Education: some college.      Family History:  The patient's family history includes Cancer in his brother; HIV/AIDS (age of onset: 22) in his daughter; Heart disease in his father; Other in his son; Stroke in his father.   ROS:   Please see the history of present illness.    ROS All other systems reviewed and are negative.   PHYSICAL EXAM:   VS:  BP (!) 165/73 (BP Location: Left Arm, Patient Position: Sitting, Cuff Size: Normal)   Pulse (!) 56   Ht 5\' 8"  (1.727 m)   Wt 246 lb (111.6 kg)   BMI 37.40 kg/m    GEN: Well nourished, well developed, in no acute distress  HEENT: normal  Neck: no JVD, carotid bruits, or masses Cardiac: RRR; no murmurs, rubs, or gallops,no edema  Respiratory:  clear to auscultation bilaterally, normal work of breathing GI: soft, nontender, nondistended, + BS MS: no deformity or atrophy  Skin: warm and dry, no rash Neuro:  Alert and Oriented x 3, Strength and sensation are intact Psych:  euthymic mood, full affect  Wt Readings from Last 3 Encounters:  08/20/17 246 lb (111.6 kg)  06/10/17 245 lb 3 oz (111.2 kg)  07/03/16 230 lb (104.3 kg)      Studies/Labs Reviewed:   EKG:  EKG is not ordered today.   Recent Labs: No results found for requested labs within last 8760 hours.   Lipid Panel    Component Value Date/Time   CHOL  10/17/2007 0540    118        ATP III CLASSIFICATION:  <200     mg/dL   Desirable  161-096  mg/dL  Borderline High  >=240    mg/dL   High   TRIG 161 09/60/4540 0540   HDL 28 (L) 10/17/2007 0540   CHOLHDL 4.2 10/17/2007 0540   VLDL 20 10/17/2007 0540   LDLCALC  10/17/2007 0540    70        Total Cholesterol/HDL:CHD Risk Coronary Heart Disease Risk Table                     Men   Women  1/2 Average Risk   3.4   3.3    Additional studies/ records that were reviewed today include:   Echo 03/19/2016 LV EF: 55% -   60% Study Conclusions  - Left ventricle: The cavity size was normal. There was   mild-moderate concentric hypertrophy. Systolic function was   normal. The estimated ejection fraction was in the range of 55%   to 60%. Wall motion was normal; there were no regional wall   motion abnormalities. Features are consistent with a pseudonormal   left ventricular filling pattern, with concomitant abnormal   relaxation and increased filling pressure (grade 2 diastolic   dysfunction). Doppler parameters are consistent with   indeterminate ventricular filling pressure. - Aortic valve: Transvalvular velocity was within the normal range.   There was no stenosis. There was no regurgitation. Valve area   (VTI): 3.1 cm^2. Valve area (Vmax): 3.11 cm^2. Valve area   (Vmean): 3.04 cm^2. - Mitral valve: Transvalvular velocity was within the normal range.   There was no evidence for stenosis. There was no regurgitation. - Left atrium: The appendage was moderately dilated. - Right ventricle: The cavity size was mildly dilated. Wall    thickness was normal. Systolic function was normal. - Tricuspid valve: There was mild regurgitation. - Pulmonary arteries: Systolic pressure was within the normal   range. PA peak pressure: 35 mm Hg (S). - Inferior vena cava: The vessel was normal in size. The   respirophasic diameter changes were in the normal range (= 50%),   consistent with normal central venous pressure.  Impressions:  - Wall motion and LVEF assessment limited by poor acoustic windows   and endocardial border definition.   Myoview 03/19/2016 Study Highlights    Nuclear stress EF: 59%. No wall motion abnormality  The left ventricular ejection fraction is normal (55-65%).  There was no ST segment deviation noted during stress. There were frequent PVCs during exercise, rare couplet  This is a low risk perfusion study, no ischemia identified. Question if frequent PVCs are contributing to dyspnea on exertion. Fair exercise effort of 6 minutes and 13 seconds.       ASSESSMENT:    1. Preop cardiovascular exam   2. Coronary artery disease involving native coronary artery of native heart without angina pectoris   3. Essential hypertension   4. Hyperlipidemia, unspecified hyperlipidemia type   5. Controlled type 2 diabetes mellitus without complication, without long-term current use of insulin (HCC)      PLAN:  In order of problems listed above:  1. Preoperative clearance: He has not had any exertional symptoms, stress test and echocardiogram obtained in 2017 were negative. No further workup is needed prior to the surgery. He will need to hold Plavix for 7 days prior to the surgery and the restart as soon as possible afterward once deemed a low bleeding risk by the surgeon.  2. CAD: Previous cardiac catheterization in 2001 showed moderate disease in LAD, Myoview in May 2017 was negative.   3.  Hypertension: Blood pressure elevated today, however blood pressure normally runs well at home   4. Hyperlipidemia:  Continue Lipitor 40 mg daily, will defer to primary care provider to obtain fasting lipid panel  5. DM 2: Will defer to primary care provider for management    Medication Adjustments/Labs and Tests Ordered: Current medicines are reviewed at length with the patient today.  Concerns regarding medicines are outlined above.  Medication changes, Labs and Tests ordered today are listed in the Patient Instructions below. Patient Instructions  Medication Instructions: Your physician recommends that you continue on your current medications as directed. Please refer to the Current Medication list given to you today.  If you need a refill on your cardiac medications before your next appointment, please call your pharmacy.   Follow-Up: Your physician wants you to follow-up in: 6 months with Dr. Duke Salviaandolph. You will receive a reminder letter in the mail two months in advance. If you don't receive a letter, please call our office at [phone] to schedule this follow-up appointment.   Thank you for choosing [facility]!!         Signed, Azalee CourseHao Jelina Paulsen, [place]  08/22/2017 2:24 PM    [facility] [address], [place], [place]  [address] Phone: [phone]; Fax: [phone]

## 2017-08-21 ENCOUNTER — Telehealth: Payer: Self-pay | Admitting: Cardiovascular Disease

## 2017-08-21 ENCOUNTER — Ambulatory Visit: Payer: Medicare Other | Admitting: Physician Assistant

## 2017-08-21 NOTE — Telephone Encounter (Signed)
New message     Aurther Lofterry from [facility] is calling asking for a call back. She said she is returning call from [place].

## 2017-08-21 NOTE — Telephone Encounter (Signed)
Spoke with French Anaracy she states that pt was here for cardiac clearance for surgery(not scheduled yet) and states that he was not told when to stop/re-start Plavix. Please advise

## 2017-08-21 NOTE — Telephone Encounter (Signed)
He will need to hold the plavis for 7 days prior to the procedure and restart after the procedure as soon as possible at the discretion of the surgeon. He was cleared for surgery yesterday.

## 2017-08-22 ENCOUNTER — Encounter: Payer: Self-pay | Admitting: Physician Assistant

## 2017-08-24 NOTE — Telephone Encounter (Signed)
Faxed via EPIC Pt notified-plavix, still waiting for surgery date per pt

## 2017-08-24 NOTE — Telephone Encounter (Signed)
Surgery Dr. Pollyann Kennedyosen ENT, excision of L submandibular mass. Phone: [phone]

## 2017-08-25 NOTE — H&P (Signed)
HPI:   Jeff Watkins is a 81 y.o. male who presents as a consult Patient.   Referring Provider: Lorenda IshiharaVaradarajan, Rupashree,*  Chief complaint: Neck mass.  HPI: About 6 months ago he noticed a small lump in the anterior neck. It fluctuates in size but not related to eating. It never hurts. No history of skin cancer. No history of smoking. Otherwise in pretty good health although he did have a stroke about 7 years ago and has been on Plavix ever since.  PMH/Meds/All/SocHx/FamHx/ROS:   History reviewed. No pertinent past medical history.  Past Surgical History:  Procedure Laterality Date  . APPENDECTOMY  . arm surgery  . BONE GRAFT  . HERNIA REPAIR  . TESTICLE SURGERY   No family history of bleeding disorders, wound healing problems or difficulty with anesthesia.   Social History   Social History  . Marital status: N/A  Spouse name: N/A  . Number of children: N/A  . Years of education: N/A   Occupational History  . Not on file.   Social History Main Topics  . Smoking status: Never Smoker  . Smokeless tobacco: Never Used  . Alcohol use Not on file  . Drug use: Unknown  . Sexual activity: Not on file   Other Topics Concern  . Not on file   Social History Narrative  . No narrative on file   Current Outpatient Prescriptions:  . amLODIPine (NORVASC) 10 MG tablet, , Disp: , Rfl:  . ANDROGEL 20.25 mg/1.25 gram (1.62 %) gel, , Disp: , Rfl:  . atorvastatin (LIPITOR) 40 MG tablet, , Disp: , Rfl:  . clopidogrel (PLAVIX) 75 mg tablet *ANTIPLATELET*, , Disp: , Rfl:  . hydrALAZINE (APRESOLINE) 25 MG tablet, , Disp: , Rfl:  . loratadine (CLARITIN ORAL), Take by mouth., Disp: , Rfl:  . metoPROLOL succinate (TOPROL-XL) 25 MG 24 hr tablet, , Disp: , Rfl:  . multivit-minerals/ferrous fum (MULTI VITAMIN ORAL), Take 1 tablet by mouth., Disp: , Rfl:  . valsartan-hydrochlorothiazide (DIOVAN-HCT) 320-25 mg per tablet, , Disp: , Rfl:   A complete ROS was performed with pertinent  positives/negatives noted in the HPI. The remainder of the ROS are negative.   Physical Exam:   Ht 1.727 m (5\' 8" )  Wt 111.1 kg (245 lb)  BMI 37.25 kg/m   General: Healthy and alert, in no distress, breathing easily. Normal affect. In a pleasant mood. Head: Normocephalic, atraumatic. No masses, or scars. Eyes: Pupils are equal, and reactive to light. Vision is grossly intact. No spontaneous or gaze nystagmus. Ears: Ear canals are clear. Tympanic membranes are intact, with normal landmarks and the middle ears are clear and healthy. Hearing: Grossly normal. Nose: Nasal cavities are clear with healthy mucosa, no polyps or exudate.Airways are patent. Face: No masses or scars, facial nerve function is symmetric. Oral Cavity: No mucosal abnormalities are noted. Tongue with normal mobility. Dentition appears healthy. Oropharynx: Tonsils are symmetric. There are no mucosal masses identified. Tongue base appears normal and healthy. Larynx/Hypopharynx: deferred Chest: Deferred Neck: Small left submandibular mass.  No other palpable masses, no cervical adenopathy, no thyroid nodules or enlargement. Neuro: Cranial nerves II-XII will normal function. Balance: Normal gate. Other findings: none.  Independent Review of Additional Tests or Records:  none  Procedures:  Procedure Note:  Indications for procedure: neck mass  Details of the procedure were discussed with the patient and all questions were answered.  Procedure:  2% xylocaine with epinephrine was infiltrated into the overlying skin. First pass was made with a  25 gauge needle and 10 cc syringe. Second pass was made with a 22 gauge needle. Specimen was placed on microscopic slides and air-dried. Additional material was placed in Cytolyte solution for cell block preparation. A third pass was made with a 22 guage needle and sample was added to the cytolyte solution.  A bandage was applied.   He tolerated the procedure well. Results  will be discussed when available.  Impression & Plans:  Left submandibular mass. FNA performed. We will discuss results when available. That will determine if this is going to need to be removed.

## 2017-08-28 NOTE — Pre-Procedure Instructions (Addendum)
Adrion L Boley  08/28/2017      [facility] - [place], [place] - [address] [place] [address] Phone: [phone] Fax: [phone]    Your procedure is scheduled on Wednesday November 7.  Report to [facility] Admitting at 8:00 A.M.  Call this number if you have problems the morning of surgery:  [phone]   Remember:  Do not eat food or drink liquids after midnight.   Take these medicines the morning of surgery with A SIP OF WATER:  Metoprolol (Toprol-XL) Pantoprazole (protonix) Hydralazine (apresoline) Loratadine (claritin) Albuterol inhaler (please bring inhaler to hospital with you)  7 days prior to surgery STOP taking any Aspirin (unless otherwise instructed by your surgeon), Aleve, Naproxen, Ibuprofen, Motrin, Advil, Goody's, BC's, all herbal medications, fish oil, and all vitamins  FOLLOW MD's instructions on stopping Plavix     How to Manage Your Diabetes Before and After Surgery  Why is it important to control my blood sugar before and after surgery? . Improving blood sugar levels before and after surgery helps healing and can limit problems. . A way of improving blood sugar control is eating a healthy diet by: o  Eating less sugar and carbohydrates o  Increasing activity/exercise o  Talking with your doctor about reaching your blood sugar goals . High blood sugars (greater than 180 mg/dL) can raise your risk of infections and slow your recovery, so you will need to focus on controlling your diabetes during the weeks before surgery. . Make sure that the doctor who takes care of your diabetes knows about your planned surgery including the date and location.  How do I manage my blood sugar before surgery? . Check your blood sugar at least 4 times a day, starting 2 days before surgery, to make sure that the level is not too high or low. o Check your blood sugar the morning of your surgery when you wake up and  every 2 hours until you get to the Short Stay unit. . If your blood sugar is less than 70 mg/dL, you will need to treat for low blood sugar: o Do not take insulin. o Treat a low blood sugar (less than 70 mg/dL) with  cup of clear juice (cranberry or apple), 4 glucose tablets, OR glucose gel. Recheck blood sugar in 15 minutes after treatment (to make sure it is greater than 70 mg/dL). If your blood sugar is not greater than 70 mg/dL on recheck, call [phone] o  for further instructions. . Report your blood sugar to the short stay nurse when you get to Short Stay.  . If you are admitted to the hospital after surgery: o Your blood sugar will be checked by the staff and you will probably be given insulin after surgery (instead of oral diabetes medicines) to make sure you have good blood sugar levels. o The goal for blood sugar control after surgery is 80-180 mg/dL.               Do not wear jewelry, make-up or nail polish.  Do not wear lotions, powders, or perfumes, or deoderant.  Do not shave 48 hours prior to surgery.  Men may shave face and neck.  Do not bring valuables to the hospital.  [facility] is not responsible for any belongings or valuables.  Contacts, dentures or bridgework may not be worn into surgery.  Leave your suitcase in the car.  After surgery it may be  brought to your room.  For patients admitted to the hospital, discharge time will be determined by your treatment team.  Patients discharged the day of surgery will not be allowed to drive home.   Special instructions:   [facility]- Preparing For Surgery  Before surgery, you can play an important role. Because skin is not sterile, your skin needs to be as free of germs as possible. You can reduce the number of germs on your skin by washing with CHG (chlorahexidine gluconate) Soap before surgery.  CHG is an antiseptic cleaner which kills germs and bonds with the skin to continue killing germs even after  washing.  Please do not use if you have an allergy to CHG or antibacterial soaps. If your skin becomes reddened/irritated stop using the CHG.  Do not shave (including legs and underarms) for at least 48 hours prior to first CHG shower. It is OK to shave your face.  Please follow these instructions carefully.   1. Shower the NIGHT BEFORE SURGERY and the MORNING OF SURGERY with CHG.   2. If you chose to wash your hair, wash your hair first as usual with your normal shampoo.  3. After you shampoo, rinse your hair and body thoroughly to remove the shampoo.  4. Use CHG as you would any other liquid soap. You can apply CHG directly to the skin and wash gently with a scrungie or a clean washcloth.   5. Apply the CHG Soap to your body ONLY FROM THE NECK DOWN.  Do not use on open wounds or open sores. Avoid contact with your eyes, ears, mouth and genitals (private parts). Wash Face and genitals (private parts)  with your normal soap.  6. Wash thoroughly, paying special attention to the area where your surgery will be performed.  7. Thoroughly rinse your body with warm water from the neck down.  8. DO NOT shower/wash with your normal soap after using and rinsing off the CHG Soap.  9. Pat yourself dry with a CLEAN TOWEL.  10. Wear CLEAN PAJAMAS to bed the night before surgery, wear comfortable clothes the morning of surgery  11. Place CLEAN SHEETS on your bed the night of your first shower and DO NOT SLEEP WITH PETS.    Day of Surgery: Do not apply any deodorants/lotions. Please wear clean clothes to the hospital/surgery center.      Please read over the following fact sheets that you were given. Coughing and Deep Breathing and Surgical Site Infection Prevention

## 2017-08-31 ENCOUNTER — Encounter (HOSPITAL_COMMUNITY): Payer: Self-pay

## 2017-08-31 ENCOUNTER — Encounter (HOSPITAL_COMMUNITY)
Admission: RE | Admit: 2017-08-31 | Discharge: 2017-08-31 | Disposition: A | Discharge status: Home / Self Care | Payer: Medicare Other | Source: Ambulatory Visit | Attending: Otolaryngology | Admitting: Otolaryngology

## 2017-08-31 ENCOUNTER — Other Ambulatory Visit: Payer: Self-pay

## 2017-08-31 DIAGNOSIS — Z8673 Personal history of transient ischemic attack (TIA), and cerebral infarction without residual deficits: Secondary | ICD-10-CM | POA: Diagnosis not present

## 2017-08-31 DIAGNOSIS — K219 Gastro-esophageal reflux disease without esophagitis: Secondary | ICD-10-CM | POA: Diagnosis not present

## 2017-08-31 DIAGNOSIS — R221 Localized swelling, mass and lump, neck: Secondary | ICD-10-CM | POA: Diagnosis present

## 2017-08-31 DIAGNOSIS — Z87891 Personal history of nicotine dependence: Secondary | ICD-10-CM | POA: Diagnosis not present

## 2017-08-31 DIAGNOSIS — K1123 Chronic sialoadenitis: Secondary | ICD-10-CM | POA: Diagnosis not present

## 2017-08-31 DIAGNOSIS — Z7902 Long term (current) use of antithrombotics/antiplatelets: Secondary | ICD-10-CM | POA: Diagnosis not present

## 2017-08-31 DIAGNOSIS — I10 Essential (primary) hypertension: Secondary | ICD-10-CM | POA: Diagnosis not present

## 2017-08-31 DIAGNOSIS — I2510 Atherosclerotic heart disease of native coronary artery without angina pectoris: Secondary | ICD-10-CM | POA: Diagnosis not present

## 2017-08-31 DIAGNOSIS — J45909 Unspecified asthma, uncomplicated: Secondary | ICD-10-CM | POA: Diagnosis not present

## 2017-08-31 DIAGNOSIS — E119 Type 2 diabetes mellitus without complications: Secondary | ICD-10-CM | POA: Diagnosis not present

## 2017-08-31 DIAGNOSIS — Z79899 Other long term (current) drug therapy: Secondary | ICD-10-CM | POA: Diagnosis not present

## 2017-08-31 DIAGNOSIS — Z6837 Body mass index (BMI) 37.0-37.9, adult: Secondary | ICD-10-CM | POA: Diagnosis not present

## 2017-08-31 HISTORY — DX: Carpal tunnel syndrome, bilateral upper limbs: G56.03

## 2017-08-31 HISTORY — DX: Unspecified asthma, uncomplicated: J45.909

## 2017-08-31 HISTORY — DX: Bronchitis, not specified as acute or chronic: J40

## 2017-08-31 HISTORY — DX: Gastro-esophageal reflux disease without esophagitis: K21.9

## 2017-08-31 HISTORY — DX: Unspecified osteoarthritis, unspecified site: M19.90

## 2017-08-31 HISTORY — DX: Atherosclerotic heart disease of native coronary artery without angina pectoris: I25.10

## 2017-08-31 LAB — BASIC METABOLIC PANEL
Anion gap: 8 (ref 5–15)
BUN: 12 mg/dL (ref 6–20)
CHLORIDE: 99 mmol/L — AB (ref 101–111)
CO2: 26 mmol/L (ref 22–32)
Calcium: 9.3 mg/dL (ref 8.9–10.3)
Creatinine, Ser: 0.93 mg/dL (ref 0.61–1.24)
GFR calc Af Amer: >60 mL/min (ref >60)
GFR calc non Af Amer: >60 mL/min (ref >60)
GLUCOSE: 108 mg/dL — AB (ref 65–99)
POTASSIUM: 4.4 mmol/L (ref 3.5–5.1)
Sodium: 133 mmol/L — ABNORMAL LOW (ref 135–145)

## 2017-08-31 LAB — GLUCOSE, CAPILLARY: Glucose-Capillary: 182 mg/dL — ABNORMAL HIGH (ref 65–99)

## 2017-08-31 LAB — CBC
HEMATOCRIT: 41.4 % (ref 39.0–52.0)
Hemoglobin: 13.8 g/dL (ref 13.0–17.0)
MCH: 29.9 pg (ref 26.0–34.0)
MCHC: 33.3 g/dL (ref 30.0–36.0)
MCV: 89.6 fL (ref 78.0–100.0)
Platelets: 207 10*3/uL (ref 150–400)
RBC: 4.62 MIL/uL (ref 4.22–5.81)
RDW: 14.2 % (ref 11.5–15.5)
WBC: 5.2 10*3/uL (ref 4.0–10.5)

## 2017-08-31 NOTE — Progress Notes (Signed)
   08/31/17 0926  OBSTRUCTIVE SLEEP APNEA  Have you ever been diagnosed with sleep apnea through a sleep study? No (had a sleep study >15 years ago, couldn't tolerate study, trial cpap)  Do you snore loudly (loud enough to be heard through closed doors)?  0  Do you often feel tired, fatigued, or sleepy during the daytime (such as falling asleep during driving or talking to someone)? 0  Has anyone observed you stop breathing during your sleep? 0  Do you have, or are you being treated for high blood pressure? 1  BMI more than 35 kg/m2? 1  Age > 50 (1-yes) 1  Neck circumference greater than:Male 16 inches or larger, Male 17inches or larger? 1 (18 inch)  Male Gender (Yes=1) 1  Obstructive Sleep Apnea Score 5  Score 5 or greater  Results sent to PCP

## 2017-08-31 NOTE — Progress Notes (Addendum)
PCP: Dr Chales SalmonVaradarajan, last saw 08/19/2017-requested latest office note to be faxed. Cardiologist: Dr. Duke Salviaandolph  EKG: 08/19/17 ECHO: 03/19/2016 Stress Test: 03/19/2016 Cardiac Cath: Reports ~[facility], reports no stents placed  Pt reports having a sleep study done >15 years ago [facility] at [facility]. Pt reports during study, pt attempted to wear CPAP, but was unable to tolerate due to claustrophobia.  Pt reports hx DM II but does not take any medication.  Reports last A1C was 6.5 at PCP office on 08/19/17--requested results to be faxed.  Pt reports he does not check CBG at home.   Cardiac clearance note in Epic from 08/20/2017.   Pt reports MD ordered Plavix to be stopped 7 days prior to surgery, last dose 08/26/2017.  Patient denies shortness of breath, fever, cough, and chest pain at PAT appointment.  Patient verbalized understanding of instructions provided today at the PAT appointment.  Patient asked to review instructions at home and day of surgery.

## 2017-09-01 ENCOUNTER — Encounter (HOSPITAL_COMMUNITY): Payer: Self-pay

## 2017-09-01 NOTE — Progress Notes (Signed)
Anesthesia Chart Review:  Pt is a 81 year old male scheduled for L submandibular gland excision on 09/02/2017 with Serena ColonelJefry Rosen, MD  - PCP is Lorenda Ishiharaupashree Varadarajan, MD - Cardiologist is Chilton Siiffany [place], MD.  Pt cleared for surgery at last office visit 08/20/17 with Azalee CourseHao Meng, PA  PMH includes:  CAD (50-70% LAD by 2001 cath), HTN, DM (diet controlled), hyperlipidemia, stroke, asthma, GERD.  Former smoker. BMI 37  Medications include: Albuterol, amlodipine, Lipitor, Plavix, iron, hydralazine, metoprolol, Protonix, valsartan-HCTZ. Last dose plavix 08/26/17.   BP (!) 153/58   Pulse (!) 58   Temp 36.8 C   Resp 20   Ht 5\' 8"  (1.727 m)   Wt 245 lb 12.8 oz (111.5 kg)   SpO2 98%   BMI 37.37 kg/m   Preoperative labs reviewed.   - glucose 108. HbA1c requested from PCP's office.   EKG 08/19/17: sinus bradycardia (57 bpm)  Nuclear stress test 03/19/16:   Nuclear stress EF: 59%. No wall motion abnormality  The left ventricular ejection fraction is normal (55-65%).  There was no ST segment deviation noted during stress. There were frequent PVCs during exercise, rare couplet  This is a low risk perfusion study, no ischemia identified. Question if frequent PVCs are contributing to dyspnea on exertion. Fair exercise effort of 6 minutes and 13 seconds.  Echo 03/19/16:  - Left ventricle: The cavity size was normal. There was mild-moderate concentric hypertrophy. Systolic function was normal. The estimated ejection fraction was in the range of 55% to 60%. Wall motion was normal; there were no regional wall motion abnormalities. Features are consistent with a pseudonormal left ventricular filling pattern, with concomitant abnormal relaxation and increased filling pressure (grade 2 diastolic dysfunction). Doppler parameters are consistent with indeterminate ventricular filling pressure. - Aortic valve: Transvalvular velocity was within the normal range. There was no stenosis. There was no regurgitation.  Valve area (VTI): 3.1 cm^2. Valve area (Vmax): 3.11 cm^2. Valve area (Vmean): 3.04 cm^2. - Mitral valve: Transvalvular velocity was within the normal range. There was no evidence for stenosis. There was no regurgitation. - Left atrium: The appendage was moderately dilated. - Right ventricle: The cavity size was mildly dilated. Wall thickness was normal. Systolic function was normal. - Tricuspid valve: There was mild regurgitation. - Pulmonary arteries: Systolic pressure was within the normal range. PA peak pressure: 35 mm Hg (S). - Inferior vena cava: The vessel was normal in size. The respirophasic diameter changes were in the normal range (= 50%), consistent with normal central venous pressure. - Impressions: Wall motion and LVEF assessment limited by poor acoustic windows and endocardial border definition.  Cardiac cath 06/24/00:  1. Borderline obstructive lesion (50-70%) in the left anterior descending artery in an asymptomatic 81 year old man. 2. No significant right coronary or left circumflex artery obstruction.  If no changes, I anticipate pt can proceed with surgery as scheduled.   Jeff Mastngela Kabbe, FNP-BC [facility]iology Phone: [phone] 09/01/2017 9:53 AM

## 2017-09-02 ENCOUNTER — Other Ambulatory Visit: Payer: Self-pay

## 2017-09-02 ENCOUNTER — Observation Stay (HOSPITAL_COMMUNITY)
Admission: RE | Admit: 2017-09-02 | Discharge: 2017-09-03 | Disposition: A | Discharge status: Home / Self Care | Payer: Medicare Other | Source: Ambulatory Visit | Attending: Otolaryngology | Admitting: Otolaryngology

## 2017-09-02 ENCOUNTER — Encounter (HOSPITAL_COMMUNITY): Payer: Self-pay

## 2017-09-02 ENCOUNTER — Encounter (HOSPITAL_COMMUNITY)
Admission: RE | Disposition: A | Discharge status: Home / Self Care | Payer: Self-pay | Source: Ambulatory Visit | Attending: Otolaryngology

## 2017-09-02 ENCOUNTER — Inpatient Hospital Stay (HOSPITAL_COMMUNITY): Payer: Medicare Other | Admitting: Emergency Medicine

## 2017-09-02 ENCOUNTER — Inpatient Hospital Stay (HOSPITAL_COMMUNITY): Payer: Medicare Other | Admitting: Certified Registered Nurse Anesthetist

## 2017-09-02 DIAGNOSIS — I2510 Atherosclerotic heart disease of native coronary artery without angina pectoris: Secondary | ICD-10-CM | POA: Insufficient documentation

## 2017-09-02 DIAGNOSIS — K1123 Chronic sialoadenitis: Principal | ICD-10-CM | POA: Insufficient documentation

## 2017-09-02 DIAGNOSIS — J45909 Unspecified asthma, uncomplicated: Secondary | ICD-10-CM | POA: Insufficient documentation

## 2017-09-02 DIAGNOSIS — K118 Other diseases of salivary glands: Secondary | ICD-10-CM | POA: Diagnosis present

## 2017-09-02 DIAGNOSIS — E119 Type 2 diabetes mellitus without complications: Secondary | ICD-10-CM | POA: Insufficient documentation

## 2017-09-02 DIAGNOSIS — Z6837 Body mass index (BMI) 37.0-37.9, adult: Secondary | ICD-10-CM | POA: Diagnosis not present

## 2017-09-02 DIAGNOSIS — Z8673 Personal history of transient ischemic attack (TIA), and cerebral infarction without residual deficits: Secondary | ICD-10-CM | POA: Insufficient documentation

## 2017-09-02 DIAGNOSIS — Z7902 Long term (current) use of antithrombotics/antiplatelets: Secondary | ICD-10-CM | POA: Insufficient documentation

## 2017-09-02 DIAGNOSIS — Z87891 Personal history of nicotine dependence: Secondary | ICD-10-CM | POA: Insufficient documentation

## 2017-09-02 DIAGNOSIS — I10 Essential (primary) hypertension: Secondary | ICD-10-CM | POA: Insufficient documentation

## 2017-09-02 DIAGNOSIS — K219 Gastro-esophageal reflux disease without esophagitis: Secondary | ICD-10-CM | POA: Insufficient documentation

## 2017-09-02 DIAGNOSIS — Z79899 Other long term (current) drug therapy: Secondary | ICD-10-CM | POA: Insufficient documentation

## 2017-09-02 HISTORY — PX: SUBMANDIBULAR GLAND EXCISION: SHX2456

## 2017-09-02 LAB — GLUCOSE, CAPILLARY
GLUCOSE-CAPILLARY: 123 mg/dL — AB (ref 65–99)
Glucose-Capillary: 124 mg/dL — ABNORMAL HIGH (ref 65–99)

## 2017-09-02 LAB — SURGICAL PCR SCREEN
MRSA, PCR: NEGATIVE
Staphylococcus aureus: NEGATIVE

## 2017-09-02 LAB — HEMOGLOBIN A1C
Hgb A1c MFr Bld: 6.6 % — ABNORMAL HIGH (ref 4.8–5.6)
MEAN PLASMA GLUCOSE: 142.72 mg/dL

## 2017-09-02 SURGERY — EXCISION, SUBMANDIBULAR GLAND
Anesthesia: General | Site: Neck | Laterality: Left

## 2017-09-02 MED ORDER — METOPROLOL SUCCINATE ER 25 MG PO TB24: 12.5000 mg | ORAL_TABLET | Freq: Every day | ORAL | Status: DC

## 2017-09-02 MED ORDER — LORATADINE 10 MG PO TABS: 10.0000 mg | ORAL_TABLET | Freq: Every morning | ORAL | Status: DC

## 2017-09-02 MED ORDER — ALBUTEROL SULFATE (2.5 MG/3ML) 0.083% IN NEBU: 3.0000 mL | INHALATION_SOLUTION | Freq: Four times a day (QID) | RESPIRATORY_TRACT | Status: DC | PRN

## 2017-09-02 MED ORDER — FERROUS SULFATE 325 (65 FE) MG PO TABS
325.0000 mg | ORAL_TABLET | Freq: Every day | ORAL | Status: DC
  Administered 2017-09-03: 325 mg via ORAL
  Filled 2017-09-02: qty 1

## 2017-09-02 MED ORDER — ARTIFICIAL TEARS OPHTHALMIC OINT
TOPICAL_OINTMENT | Freq: Every day | OPHTHALMIC | Status: DC
  Administered 2017-09-02: 23:00:00 via OPHTHALMIC
  Filled 2017-09-02 (×2): qty 3.5

## 2017-09-02 MED ORDER — IBUPROFEN 400 MG PO TABS: 400.0000 mg | ORAL_TABLET | Freq: Four times a day (QID) | ORAL | Status: DC | PRN

## 2017-09-02 MED ORDER — ONDANSETRON HCL 4 MG/2ML IJ SOLN
INTRAMUSCULAR | Status: DC | PRN
  Administered 2017-09-02: 4 mg via INTRAVENOUS

## 2017-09-02 MED ORDER — POLYETHYL GLYCOL-PROPYL GLYCOL 0.4-0.3 % OP GEL: Freq: Every day | OPHTHALMIC | Status: DC

## 2017-09-02 MED ORDER — LIDOCAINE HCL (CARDIAC) 20 MG/ML IV SOLN
INTRAVENOUS | Status: DC | PRN
  Administered 2017-09-02: 100 mg via INTRAVENOUS

## 2017-09-02 MED ORDER — OMEGA-3-ACID ETHYL ESTERS 1 G PO CAPS: 1.0000 g | ORAL_CAPSULE | Freq: Every day | ORAL | Status: DC

## 2017-09-02 MED ORDER — FISH OIL + D3 1200-1000 MG-UNIT PO CAPS: ORAL_CAPSULE | Freq: Every morning | ORAL | Status: DC

## 2017-09-02 MED ORDER — 0.9 % SODIUM CHLORIDE (POUR BTL) OPTIME
TOPICAL | Status: DC | PRN
Start: 2017-09-02 — End: 2017-09-02
  Administered 2017-09-02: 1000 mL

## 2017-09-02 MED ORDER — PANTOPRAZOLE SODIUM 20 MG PO TBEC
20.0000 mg | DELAYED_RELEASE_TABLET | Freq: Every day | ORAL | Status: DC
  Administered 2017-09-02: 20 mg via ORAL
  Filled 2017-09-02: qty 1

## 2017-09-02 MED ORDER — FENTANYL CITRATE (PF) 250 MCG/5ML IJ SOLN
INTRAMUSCULAR | Status: AC
  Filled 2017-09-02: qty 5

## 2017-09-02 MED ORDER — HYDROMORPHONE HCL 1 MG/ML IJ SOLN: 0.2500 mg | INTRAMUSCULAR | Status: DC | PRN

## 2017-09-02 MED ORDER — OXYCODONE HCL 5 MG PO TABS: 5.0000 mg | ORAL_TABLET | Freq: Once | ORAL | Status: DC | PRN

## 2017-09-02 MED ORDER — HYDROCODONE-ACETAMINOPHEN 7.5-325 MG PO TABS: 1.0000 | ORAL_TABLET | Freq: Four times a day (QID) | ORAL | 0 refills | Status: DC | PRN

## 2017-09-02 MED ORDER — ADULT MULTIVITAMIN W/MINERALS CH
ORAL_TABLET | Freq: Every evening | ORAL | Status: DC
  Administered 2017-09-02: 1 via ORAL
  Filled 2017-09-02: qty 1

## 2017-09-02 MED ORDER — EPHEDRINE SULFATE 50 MG/ML IJ SOLN
INTRAMUSCULAR | Status: DC | PRN
Start: 2017-09-02 — End: 2017-09-02
  Administered 2017-09-02: 10 mg via INTRAVENOUS
  Administered 2017-09-02: 5 mg via INTRAVENOUS

## 2017-09-02 MED ORDER — ATORVASTATIN CALCIUM 40 MG PO TABS
40.0000 mg | ORAL_TABLET | Freq: Every day | ORAL | Status: DC
  Administered 2017-09-02: 40 mg via ORAL
  Filled 2017-09-02: qty 1

## 2017-09-02 MED ORDER — LACTATED RINGERS IV SOLN
INTRAVENOUS | Status: DC
  Administered 2017-09-02: 10:00:00 via INTRAVENOUS

## 2017-09-02 MED ORDER — VALSARTAN-HYDROCHLOROTHIAZIDE 320-25 MG PO TABS: 1.0000 | ORAL_TABLET | Freq: Every day | ORAL | Status: DC

## 2017-09-02 MED ORDER — OXYCODONE HCL 5 MG/5ML PO SOLN: 5.0000 mg | Freq: Once | ORAL | Status: DC | PRN

## 2017-09-02 MED ORDER — DEXAMETHASONE SODIUM PHOSPHATE 10 MG/ML IJ SOLN
INTRAMUSCULAR | Status: DC | PRN
  Administered 2017-09-02: 4 mg via INTRAVENOUS

## 2017-09-02 MED ORDER — BACITRACIN ZINC 500 UNIT/GM EX OINT
TOPICAL_OINTMENT | CUTANEOUS | Status: AC
  Filled 2017-09-02: qty 28.35

## 2017-09-02 MED ORDER — LIDOCAINE-EPINEPHRINE 1 %-1:100000 IJ SOLN
INTRAMUSCULAR | Status: AC
  Filled 2017-09-02: qty 1

## 2017-09-02 MED ORDER — PROMETHAZINE HCL 25 MG RE SUPP: 25.0000 mg | Freq: Four times a day (QID) | RECTAL | 1 refills | Status: DC | PRN

## 2017-09-02 MED ORDER — PROPOFOL 10 MG/ML IV BOLUS
INTRAVENOUS | Status: AC
  Filled 2017-09-02: qty 20

## 2017-09-02 MED ORDER — PROMETHAZINE HCL 25 MG RE SUPP
25.0000 mg | Freq: Four times a day (QID) | RECTAL | Status: DC | PRN
  Filled 2017-09-02: qty 1

## 2017-09-02 MED ORDER — AMLODIPINE BESYLATE 10 MG PO TABS
10.0000 mg | ORAL_TABLET | Freq: Every day | ORAL | Status: DC
  Administered 2017-09-02: 10 mg via ORAL
  Filled 2017-09-02: qty 1

## 2017-09-02 MED ORDER — ROCURONIUM BROMIDE 100 MG/10ML IV SOLN
INTRAVENOUS | Status: DC | PRN
  Administered 2017-09-02: 45 mg via INTRAVENOUS

## 2017-09-02 MED ORDER — EPHEDRINE 5 MG/ML INJ
INTRAVENOUS | Status: AC
  Filled 2017-09-02: qty 10

## 2017-09-02 MED ORDER — HYDRALAZINE HCL 50 MG PO TABS
50.0000 mg | ORAL_TABLET | Freq: Two times a day (BID) | ORAL | Status: DC
  Administered 2017-09-02: 50 mg via ORAL
  Filled 2017-09-02: qty 1

## 2017-09-02 MED ORDER — IRBESARTAN 300 MG PO TABS: 300.0000 mg | ORAL_TABLET | Freq: Every day | ORAL | Status: DC

## 2017-09-02 MED ORDER — PHENYLEPHRINE 40 MCG/ML (10ML) SYRINGE FOR IV PUSH (FOR BLOOD PRESSURE SUPPORT)
PREFILLED_SYRINGE | INTRAVENOUS | Status: AC
  Filled 2017-09-02: qty 10

## 2017-09-02 MED ORDER — SUGAMMADEX SODIUM 500 MG/5ML IV SOLN
INTRAVENOUS | Status: AC
  Filled 2017-09-02: qty 5

## 2017-09-02 MED ORDER — PROMETHAZINE HCL 25 MG PO TABS: 25.0000 mg | ORAL_TABLET | Freq: Four times a day (QID) | ORAL | Status: DC | PRN

## 2017-09-02 MED ORDER — VITAMIN D 1000 UNITS PO TABS: 1000.0000 [IU] | ORAL_TABLET | Freq: Every day | ORAL | Status: DC

## 2017-09-02 MED ORDER — HYDROCODONE-ACETAMINOPHEN 5-325 MG PO TABS: 1.0000 | ORAL_TABLET | ORAL | Status: DC | PRN

## 2017-09-02 MED ORDER — HYDROCHLOROTHIAZIDE 25 MG PO TABS: 25.0000 mg | ORAL_TABLET | Freq: Every day | ORAL | Status: DC

## 2017-09-02 MED ORDER — ONDANSETRON HCL 4 MG/2ML IJ SOLN
INTRAMUSCULAR | Status: AC
  Filled 2017-09-02: qty 2

## 2017-09-02 MED ORDER — DEXTROSE-NACL 5-0.9 % IV SOLN
INTRAVENOUS | Status: DC
  Administered 2017-09-02: 15:00:00 via INTRAVENOUS

## 2017-09-02 MED ORDER — TESTOSTERONE 50 MG/5GM (1%) TD GEL: 5.0000 g | Freq: Every morning | TRANSDERMAL | Status: DC

## 2017-09-02 MED ORDER — SUGAMMADEX SODIUM 500 MG/5ML IV SOLN
INTRAVENOUS | Status: DC | PRN
  Administered 2017-09-02: 300 mg via INTRAVENOUS

## 2017-09-02 MED ORDER — PROPOFOL 10 MG/ML IV BOLUS
INTRAVENOUS | Status: DC | PRN
  Administered 2017-09-02: 200 mg via INTRAVENOUS

## 2017-09-02 MED ORDER — FENTANYL CITRATE (PF) 100 MCG/2ML IJ SOLN
INTRAMUSCULAR | Status: DC | PRN
  Administered 2017-09-02 (×2): 50 ug via INTRAVENOUS

## 2017-09-02 SURGICAL SUPPLY — 51 items
ADH SKN CLS APL DERMABOND .7 (GAUZE/BANDAGES/DRESSINGS) ×1
BLADE SURG 15 STRL LF DISP TIS (BLADE) IMPLANT
BLADE SURG 15 STRL SS (BLADE)
CANISTER SUCT 3000ML PPV (MISCELLANEOUS) ×2 IMPLANT
CLEANER TIP ELECTROSURG 2X2 (MISCELLANEOUS) ×2 IMPLANT
CONT SPEC 4OZ CLIKSEAL STRL BL (MISCELLANEOUS) ×2 IMPLANT
CORDS BIPOLAR (ELECTRODE) ×2 IMPLANT
COVER SURGICAL LIGHT HANDLE (MISCELLANEOUS) ×2 IMPLANT
DECANTER SPIKE VIAL GLASS SM (MISCELLANEOUS) ×1 IMPLANT
DERMABOND ADVANCED (GAUZE/BANDAGES/DRESSINGS) ×1
DERMABOND ADVANCED .7 DNX12 (GAUZE/BANDAGES/DRESSINGS) ×1 IMPLANT
DRAIN HEMOVAC 7FR (DRAIN) ×1 IMPLANT
DRAIN PENROSE 1/4X12 LTX STRL (WOUND CARE) ×1 IMPLANT
DRAIN SNY 10 ROU (WOUND CARE) IMPLANT
ELECT COATED BLADE 2.86 ST (ELECTRODE) ×2 IMPLANT
ELECT REM PT RETURN 9FT ADLT (ELECTROSURGICAL) ×2
ELECTRODE REM PT RTRN 9FT ADLT (ELECTROSURGICAL) ×1 IMPLANT
EVACUATOR SILICONE 100CC (DRAIN) ×1 IMPLANT
FORCEPS BIPOLAR SPETZLER 8 1.0 (NEUROSURGERY SUPPLIES) ×2 IMPLANT
GLOVE BIO SURGEON STRL SZ 6.5 (GLOVE) ×2 IMPLANT
GLOVE BIO SURGEON STRL SZ7 (GLOVE) ×1 IMPLANT
GLOVE BIOGEL PI IND STRL 7.0 (GLOVE) IMPLANT
GLOVE BIOGEL PI INDICATOR 7.0 (GLOVE) ×1
GLOVE ECLIPSE 7.5 STRL STRAW (GLOVE) ×2 IMPLANT
GOWN STRL REUS W/ TWL LRG LVL3 (GOWN DISPOSABLE) ×2 IMPLANT
GOWN STRL REUS W/TWL LRG LVL3 (GOWN DISPOSABLE) ×4
KIT BASIN OR (CUSTOM PROCEDURE TRAY) ×2 IMPLANT
KIT ROOM TURNOVER OR (KITS) ×2 IMPLANT
LOCATOR NERVE 3 VOLT (DISPOSABLE) IMPLANT
NDL PRECISIONGLIDE 27X1.5 (NEEDLE) IMPLANT
NEEDLE PRECISIONGLIDE 27X1.5 (NEEDLE) ×2 IMPLANT
NS IRRIG 1000ML POUR BTL (IV SOLUTION) ×2 IMPLANT
PAD ARMBOARD 7.5X6 YLW CONV (MISCELLANEOUS) ×4 IMPLANT
PENCIL FOOT CONTROL (ELECTRODE) ×2 IMPLANT
SHEARS HARMONIC 9CM CVD (BLADE) IMPLANT
SPECIMEN JAR SMALL (MISCELLANEOUS) ×1 IMPLANT
STAPLER VISISTAT 35W (STAPLE) ×2 IMPLANT
SUT CHROMIC 3 0 PS 2 (SUTURE) ×1 IMPLANT
SUT CHROMIC 4 0 PS 2 18 (SUTURE) IMPLANT
SUT ETHILON 2 0 FS 18 (SUTURE) ×1 IMPLANT
SUT ETHILON 3 0 PS 1 (SUTURE) IMPLANT
SUT ETHILON 5 0 P 3 18 (SUTURE)
SUT NYLON ETHILON 5-0 P-3 1X18 (SUTURE) IMPLANT
SUT SILK 2 0 REEL (SUTURE) ×1 IMPLANT
SUT SILK 2 0 SH CR/8 (SUTURE) ×1 IMPLANT
SUT SILK 3 0 REEL (SUTURE) ×2 IMPLANT
SUT SILK 4 0 TIES 17X18 (SUTURE) ×2 IMPLANT
SUT VIC AB 3-0 FS2 27 (SUTURE) IMPLANT
SUT VICRYL 4-0 PS2 18IN ABS (SUTURE) IMPLANT
TRAY ENT MC OR (CUSTOM PROCEDURE TRAY) ×2 IMPLANT
WATER STERILE IRR 1000ML POUR (IV SOLUTION) ×2 IMPLANT

## 2017-09-02 NOTE — Transfer of Care (Signed)
Immediate Anesthesia Transfer of Care Note  Patient: Jeff Watkins  Procedure(s) Performed: EXCISION LEFT SUBMANDIBULAR GLAND (Left Neck)  Patient Location: PACU  Anesthesia Type:General  Level of Consciousness: awake, alert  and oriented  Airway & Oxygen Therapy: Patient Spontanous Breathing and Patient connected to nasal cannula oxygen  Post-op Assessment: Report given to RN, Post -op Vital signs reviewed and stable and Patient moving all extremities X 4  Post vital signs: Reviewed and stable  Last Vitals:  Vitals:   09/02/17 0833 09/02/17 1142  BP: (!) 145/48   Pulse: (!) 56   Resp: 18   Temp: 36.8 C (P) 36.8 C  SpO2: 97%     Last Pain:  Vitals:   09/02/17 0833  TempSrc: Oral      Patients Stated Pain Goal: 3 (09/02/17 0829)  Complications: No apparent anesthesia complications

## 2017-09-02 NOTE — Progress Notes (Signed)
09/02/2017 5:52 PM  Jeff Watkins, Jeff Watkins [id number]  Post-Op check   Temp:  [97.7 F (36.5 C)-98.3 F (36.8 C)] 98.2 F (36.8 C) (11/07 1344) Pulse Rate:  [56-66] 66 (11/07 1344) Resp:  [8-18] 13 (11/07 1315) BP: (136-168)/(48-77) 168/77 (11/07 1344) SpO2:  [94 %-98 %] 95 % (11/07 1344) Weight:  [111.5 kg (245 lb 12.8 oz)] 111.5 kg (245 lb 12.8 oz) (11/07 0829),     Intake/Output Summary (Last 24 hours) at 09/02/2017 1752 Last data filed at 09/02/2017 1740 Gross per 24 hour  Intake 1233.75 ml  Output 990 ml  Net 243.75 ml   JP:  15 ml  Results for orders placed or performed during the hospital encounter of 09/02/17 (from the past 24 hour(s))  Surgical pcr screen     Status: None   Collection Time: 09/02/17  8:13 AM  Result Value Ref Range   MRSA, PCR NEGATIVE NEGATIVE   Staphylococcus aureus NEGATIVE NEGATIVE  Glucose, capillary     Status: Abnormal   Collection Time: 09/02/17  8:25 AM  Result Value Ref Range   Glucose-Capillary 124 (H) 65 - 99 mg/dL  Hemoglobin [id number]     Status: Abnormal   Collection Time: 09/02/17  8:30 AM  Result Value Ref Range   Hgb A1c MFr Bld 6.6 (H) 4.8 - 5.6 %   Mean Plasma Glucose 142.72 mg/dL  Glucose, capillary     Status: Abnormal   Collection Time: 09/02/17 11:43 AM  Result Value Ref Range   Glucose-Capillary 123 (H) 65 - 99 mg/dL   Comment 1 Notify RN    Comment 2 Document in Chart     SUBJECTIVE:  Min pain.  No SOB or chest pain.  Voiding.  Eating/drinking well.    OBJECTIVE:   Sl ramus weakness, LEFT.  Voice strong and clear.  Neck flat, drains working well.   IMPRESSION:  Satisfactory check  PLAN:   Ambulate to void.  IV out.  Probable d/c home in AM.    [place], Jeff Watkins

## 2017-09-02 NOTE — Discharge Instructions (Signed)
You may shower and use soap and water. Do not use any creams, oils or ointment. ° °

## 2017-09-02 NOTE — Interval H&P Note (Signed)
History and Physical Interval Note:  09/02/2017 9:33 AM  Jeff Watkins  has presented today for surgery, with the diagnosis of Left Submandibular Mass  The various methods of treatment have been discussed with the patient and family. After consideration of risks, benefits and other options for treatment, the patient has consented to  Procedure(s) with comments: EXCISION SUBMANDIBULAR GLAND (Left) - His PA, Louise, will be assisting. as a surgical intervention .  The patient's history has been reviewed, patient examined, no change in status, stable for surgery.  I have reviewed the patient's chart and labs.  Questions were answered to the patient's satisfaction.     Gianah Batt

## 2017-09-02 NOTE — Op Note (Signed)
OPERATIVE REPORT  DATE OF SURGERY: 09/02/2017  PATIENT:  Jeff Watkins,  81 y.o. male  PRE-OPERATIVE DIAGNOSIS:  Left Submandibular Mass  POST-OPERATIVE DIAGNOSIS:  Left Submandibular Mass  PROCEDURE:  Procedure(s): EXCISION LEFT SUBMANDIBULAR GLAND  SURGEON:  Susy FrizzleJefry H Dsean Vantol, MD  ASSISTANTS: Aquilla HackerLouise Nordbladh PA  ANESTHESIA:   General   EBL:  5 ml  DRAINS: 7 French round JP  LOCAL MEDICATIONS USED:  None  SPECIMEN:  Left submandibular gland and associated soft tissue and lymph nodes  COUNTS:  Correct  PROCEDURE DETAILS: The patient was taken to the operating room and placed on the operating table in the supine position. Following induction of general endotracheal anesthesia, the left neck was prepped and draped in standard fashion. A marking pen was used to mark the proposed incision along a skin crease about 8 cm below the mandible. Cautery was used to incise the skin, subcutaneous tissue and platysma layer. Subplatysmal flap was elevated up to the mandible. So pertaining retractors were used throughout the case.  The superficial fascia was inspected and the mandibular branch of the facial nerve was identified and dissected free. This was elevated up superiorly exposing the facial lymph nodes which were then brought down inferiorly. Dissection was continued down to the facial vessels which were separately identified, ligated between clamps and divided. 4-0 silk ties were used. Dissection then continued down to the mylohyoid muscle. The nerve to the mylohyoid was divided. As the mylohyoid was exposed and then reflected anteriorly the duct and lingual nerve were identified. The lingual nerve was preserved but the ganglion was sacrificed and brought inferiorly. The duct was divided and ligated. The gland was then dissected off of the digastric muscle and brought back posteriorly towards the facial artery. The facial artery was ligated and divided between clamps and then the specimen was  delivered. The hypoglossal nerve was preserved. There were several enlarged lymph nodes in the facial node area and there was an approximately 2 cm firm mass within the anterior aspect of the gland. This was all sent for pathologic evaluation.  The wound was irrigated with saline. Hemostasis was completed using bipolar cautery and additional ties as needed. The drain was exited through separate stab incision inferiorly and secured in place with nylon suture. The platysmal layer was reapproximated with interrupted 3-0 chromic. A running subcuticular closure was accomplished with chromic as well. Dermabond was used on the skin. The drain was charged. Patient was awakened and transferred to recovery in stable condition.    PATIENT DISPOSITION:  To PACU, stable

## 2017-09-02 NOTE — Anesthesia Postprocedure Evaluation (Signed)
Anesthesia Post Note  Patient: Jeff Watkins  Procedure(s) Performed: EXCISION LEFT SUBMANDIBULAR GLAND (Left Neck)     Patient location during evaluation: PACU Anesthesia Type: General Level of consciousness: awake and sedated Pain management: pain level controlled Vital Signs Assessment: post-procedure vital signs reviewed and stable Respiratory status: spontaneous breathing, nonlabored ventilation, respiratory function stable and patient connected to nasal cannula oxygen Cardiovascular status: blood pressure returned to baseline and stable Postop Assessment: no apparent nausea or vomiting Anesthetic complications: no    Last Vitals:  Vitals:   09/02/17 1315 09/02/17 1344  BP:  (!) 168/77  Pulse: 60 66  Resp: 13   Temp:  36.8 C  SpO2: 96% 95%    Last Pain:  Vitals:   09/02/17 1356  TempSrc:   PainSc: 5                  Shawne Eskelson,JAMES TERRILL

## 2017-09-02 NOTE — Anesthesia Procedure Notes (Signed)
Procedure Name: Intubation Date/Time: 09/02/2017 10:23 AM Performed by: Inda Coke, CRNA Pre-anesthesia Checklist: Patient identified, Emergency Drugs available, Suction available and Patient being monitored Patient Re-evaluated:Patient Re-evaluated prior to induction Oxygen Delivery Method: Circle System Utilized Preoxygenation: Pre-oxygenation with 100% oxygen Induction Type: IV induction Ventilation: Mask ventilation without difficulty and Oral airway inserted - appropriate to patient size Laryngoscope Size: Mac and 4 Grade View: Grade II Tube type: Oral Tube size: 7.5 mm Number of attempts: 1 Airway Equipment and Method: Stylet and Oral airway Placement Confirmation: ETT inserted through vocal cords under direct vision,  positive ETCO2 and breath sounds checked- equal and bilateral Secured at: 22 cm Tube secured with: Tape Dental Injury: Teeth and Oropharynx as per pre-operative assessment

## 2017-09-02 NOTE — Anesthesia Preprocedure Evaluation (Addendum)
Anesthesia Evaluation  Patient identified by MRN, date of birth, ID band Patient awake    Reviewed: Allergy & Precautions, NPO status , Patient's Chart, lab work & pertinent test results  History of Anesthesia Complications (+) history of anesthetic complications  Airway Mallampati: II  TM Distance: >3 FB Neck ROM: Full    Dental no notable dental hx.    Pulmonary shortness of breath, asthma , former smoker,    breath sounds clear to auscultation       Cardiovascular hypertension, + CAD   Rhythm:Regular Rate:Normal     Neuro/Psych  Neuromuscular disease CVA    GI/Hepatic Neg liver ROS, GERD  ,  Endo/Other  diabetesMorbid obesity  Renal/GU      Musculoskeletal  (+) Arthritis ,   Abdominal (+) + obese,   Peds  Hematology   Anesthesia Other Findings   Reproductive/Obstetrics                            Anesthesia Physical Anesthesia Plan  ASA: III  Anesthesia Plan: General   Post-op Pain Management:    Induction: Intravenous  PONV Risk Score and Plan: 3 and Treatment may vary due to age or medical condition, Ondansetron and Dexamethasone  Airway Management Planned: Oral ETT  Additional Equipment:   Intra-op Plan:   Post-operative Plan: Extubation in OR  Informed Consent: I have reviewed the patients History and Physical, chart, labs and discussed the procedure including the risks, benefits and alternatives for the proposed anesthesia with the patient or authorized representative who has indicated his/her understanding and acceptance.   Dental advisory given  Plan Discussed with: CRNA  Anesthesia Plan Comments:         Anesthesia Quick Evaluation

## 2017-09-03 ENCOUNTER — Encounter (HOSPITAL_COMMUNITY): Payer: Self-pay | Admitting: Otolaryngology

## 2017-09-03 DIAGNOSIS — K1123 Chronic sialoadenitis: Secondary | ICD-10-CM | POA: Diagnosis not present

## 2017-09-03 NOTE — Progress Notes (Signed)
Pt was discharge to home.  Discharge  And follow up nstructions given.

## 2017-09-03 NOTE — Discharge Summary (Signed)
  Physician Discharge Summary  Patient ID: Jeff Watkins MRN: [id number] DOB/AGE: [birth year] 81 y.o.  Admit date: 09/02/2017 Discharge date: 09/03/2017  Admission Diagnoses:submandibular mass  Discharge Diagnoses:  Active Problems:   Submandibular gland mass   Discharged Condition: good  Hospital Course: no complications  Consults: none  Significant Diagnostic Studies: none  Treatments: surgery: removal submandibular gland  Discharge Exam: Blood pressure (!) 146/51, pulse (!) 58, temperature 98.4 F (36.9 C), temperature source Oral, resp. rate 16, height 5\' 8"  (1.727 m), weight 111.5 kg (245 lb 12.8 oz), SpO2 96 %. PHYSICAL EXAM: Awake and alert, incision excellent. JP removed. Slight ramus weekness.   Disposition: 01-Home or Self Care  Discharge Instructions    Diet - low sodium heart healthy   Complete by:  As directed    Increase activity slowly   Complete by:  As directed      Allergies as of 09/03/2017      Reactions   Penicillins Rash   Sulfur Hives   In genital area accompanied by blisters      Medication List    TAKE these medications   ADVIL 200 MG tablet Generic drug:  ibuprofen Take 400 mg by mouth every 6 (six) hours as needed for moderate pain. Reported on 05/08/2016   albuterol 108 (90 Base) MCG/ACT inhaler Commonly known as:  PROVENTIL HFA;VENTOLIN HFA Inhale 1-2 puffs into the lungs every 6 (six) hours as needed for wheezing or shortness of breath. Reported on 05/08/2016   amLODipine 10 MG tablet Commonly known as:  NORVASC Take 10 mg by mouth at bedtime.   atorvastatin 40 MG tablet Commonly known as:  LIPITOR Take 40 mg by mouth at bedtime.   clopidogrel 75 MG tablet Commonly known as:  PLAVIX Take 75 mg by mouth daily with breakfast.   ferrous sulfate 325 (65 FE) MG tablet Take 325 mg by mouth daily with breakfast.   FISH OIL + D3 PO Take 1 capsule by mouth every morning.   hydrALAZINE 25 MG tablet Commonly known as:   APRESOLINE Take 2 tablets by mouth 2 (two) times daily.   HYDROcodone-acetaminophen 7.5-325 MG tablet Commonly known as:  NORCO Take 1 tablet every 6 (six) hours as needed by mouth for moderate pain.   loratadine 10 MG tablet Commonly known as:  CLARITIN Take 10 mg by mouth every morning.   metoprolol succinate 25 MG 24 hr tablet Commonly known as:  TOPROL-XL TAKE ONE (1) TABLET EACH DAY   MULTI VITAMIN MENS PO Take 1 tablet by mouth every evening.   pantoprazole 20 MG tablet Commonly known as:  PROTONIX TAKE ONE (1) TABLET EACH DAY   promethazine 25 MG suppository Commonly known as:  PHENERGAN Place 1 suppository (25 mg total) every 6 (six) hours as needed rectally for nausea or vomiting.   SYSTANE OP Place 1 drop into both eyes at bedtime.   testosterone 50 MG/5GM (1%) Gel Commonly known as:  ANDROGEL Place 5 g onto the skin every morning.   valsartan-hydrochlorothiazide 320-25 MG tablet Commonly known as:  DIOVAN-HCT Take 1 tablet by mouth daily.      Follow-up Information    Serena Colonelosen, Alaya Iverson, MD. Schedule an appointment as soon as possible for a visit in 1 week.   Specialty:  Otolaryngology Contact information: [address] [place] [address] [phone]           Signed: Serena ColonelROSEN, Arkeem Harts 09/03/2017, 8:40 AM

## 2017-09-09 ENCOUNTER — Ambulatory Visit: Payer: Medicare Other | Admitting: Neurology

## 2017-09-23 ENCOUNTER — Other Ambulatory Visit: Payer: Self-pay | Admitting: Cardiovascular Disease

## 2017-09-23 NOTE — Telephone Encounter (Signed)
Please review for refill, Thanks !  

## 2018-04-05 ENCOUNTER — Other Ambulatory Visit: Payer: Self-pay | Admitting: Cardiovascular Disease

## 2018-06-24 ENCOUNTER — Ambulatory Visit: Payer: Medicare Other | Admitting: Cardiovascular Disease

## 2018-06-24 ENCOUNTER — Encounter: Payer: Self-pay | Admitting: Cardiovascular Disease

## 2018-06-24 VITALS — BP 156/64 | HR 59 | Ht 68.0 in | Wt 240.0 lb

## 2018-06-24 DIAGNOSIS — I10 Essential (primary) hypertension: Secondary | ICD-10-CM | POA: Diagnosis not present

## 2018-06-24 DIAGNOSIS — E785 Hyperlipidemia, unspecified: Secondary | ICD-10-CM

## 2018-06-24 DIAGNOSIS — I5032 Chronic diastolic (congestive) heart failure: Secondary | ICD-10-CM | POA: Diagnosis not present

## 2018-06-24 DIAGNOSIS — E7800 Pure hypercholesterolemia, unspecified: Secondary | ICD-10-CM

## 2018-06-24 MED ORDER — DOXAZOSIN MESYLATE 4 MG PO TABS: 4.0000 mg | ORAL_TABLET | Freq: Every day | ORAL | 5 refills | Status: DC

## 2018-06-24 NOTE — Progress Notes (Signed)
h   Cardiology Office Note   Date:  06/25/2018   ID:  Jeff Watkins, DOB December 17, 1935, MRN [id number]  PCP:  Jeff Ishihara, MD  Cardiologist:   Jeff Si, MD   No chief complaint on file.    History of Present Illness: Jeff Watkins is a 82 y.o. male with CAD, hypertension, hyperlipidemia, OSA, GERD and diabetes who presents for follow up.  Mr. Jeff Watkins was first seen on 5/1 with a complaint of dyspnea on exertion.  He was referred for an echo that showed LVEF 55-60% and grade 2 diastolic dysfunction.  He also had an exercise Myoview that was negative for ischemia but did reveal frequent PVCs.  He exercised for 6 minutes on the Bruce protocol (5.4 METS). At that appointment his losartan/HCTZ was increased due to poorly-controlled hypertension.  His blood pressure continues to be elevated.  Since his last appointment his primary care physician increase hydralazine to 50 mill grams twice daily.  He has not been checking his blood pressure much lately but thinks it is been consistently elevated.  He has not had any palpitations lately.  He denies lower extremity edema, orthopnea, or PND.  He has not been getting much exercise.  He is somewhat limited by right hip pain.  He has arthritis and needs an injection soon.  At his last appointment Mr. Jeff Watkins had PVCs and his BP was poorly controlled so metoprolol was increased.  Since that time he had a submandibular mass removed 08/2017.  Pathology was benign.  He cares for his wife who has visual impairment.  He is active around the house but does not formally exercise.  He struggles with his weight and reports that his diet is pretty healthy.   Past Medical History:  Diagnosis Date  . Arthritis   . Asthma   . Bronchitis   . Carpal tunnel syndrome, bilateral   . Coronary artery disease    50-70% LAD by 2001 cath  . Diabetes mellitus without complication (HCC)   . Essential hypertension 02/25/2016  . GERD (gastroesophageal reflux disease)   .  Hypercholesteremia   . Hyperlipidemia 02/25/2016  . Hypertension   . Obesity (BMI 30-39.9) 02/25/2016  . Shortness of breath 02/25/2016  . Stroke [facility]) ~2010   mini stroke     Past Surgical History:  Procedure Laterality Date  . CARDIAC CATHETERIZATION  2001  . COLONOSCOPY WITH PROPOFOL N/A 05/21/2015   Procedure: COLONOSCOPY WITH PROPOFOL;  Surgeon: Jeff Bumpers, MD;  Location: [facility] END[facility];  Service: Endoscopy;  Laterality: N/A;  . EYE SURGERY     bilateral cataracts  . FRACTURE SURGERY  1966   compound arm  . HERNIA REPAIR    . LAPAROSCOPIC APPENDECTOMY N/A 10/18/2013   Procedure: APPENDECTOMY LAPAROSCOPIC;  Surgeon: Jeff Ridges, MD;  Location: [facility] OR;  Service: General;  Laterality: N/A;  . SUBMANDIBULAR GLAND EXCISION Left 09/02/2017    Left submandibular gland and associated soft tissue and lymph nodes  . SUBMANDIBULAR GLAND EXCISION Left 09/02/2017   Procedure: EXCISION LEFT SUBMANDIBULAR GLAND;  Surgeon: Jeff Colonel, MD;  Location: [facility] OR;  Service: ENT;  Laterality: Left;  . TESTICLE REMOVAL  1965     Current Outpatient Medications  Medication Sig Dispense Refill  . albuterol (PROVENTIL HFA;VENTOLIN HFA) 108 (90 BASE) MCG/ACT inhaler Inhale 1-2 puffs into the lungs every 6 (six) hours as needed for wheezing or shortness of breath. Reported on 05/08/2016    . amLODipine (NORVASC) 10 MG tablet Take 10  mg by mouth at bedtime.     Jeff Watkins 2 MG/24HR PT24 Apply 1 patch topically daily.     Jeff Watkins atorvastatin (LIPITOR) 40 MG tablet Take 40 mg by mouth at bedtime.     . clopidogrel (PLAVIX) 75 MG tablet Take 75 mg by mouth daily with breakfast.    . ferrous sulfate 325 (65 FE) MG tablet Take 325 mg by mouth daily with breakfast.    . Fish Oil-Cholecalciferol (FISH OIL + D3 PO) Take 1 capsule by mouth every morning.     . hydrALAZINE (APRESOLINE) 50 MG tablet Take 1 tablet by mouth 2 (two) times daily.    Jeff Watkins HYDROcodone-acetaminophen (NORCO) 7.5-325 MG tablet Take 1 tablet every 6  (six) hours as needed by mouth for moderate pain. 20 tablet 0  . ibuprofen (ADVIL) 200 MG tablet Take 400 mg by mouth every 6 (six) hours as needed for moderate pain. Reported on 05/08/2016    . loratadine (CLARITIN) 10 MG tablet Take 10 mg by mouth every morning.     . metoprolol succinate (TOPROL-XL) 25 MG 24 hr tablet TAKE ONE (1) TABLET BY MOUTH EACH DAY 90 tablet 1  . Multiple Vitamin (MULTI VITAMIN MENS PO) Take 1 tablet by mouth every evening.     . pantoprazole (PROTONIX) 20 MG tablet TAKE ONE (1) TABLET EACH DAY 30 tablet 0  . Polyethyl Glycol-Propyl Glycol (SYSTANE OP) Place 1 drop into both eyes at bedtime.     . valsartan-hydrochlorothiazide (DIOVAN-HCT) 320-25 MG tablet Take 1 tablet by mouth daily. 30 tablet 6  . doxazosin (CARDURA) 4 MG tablet Take 1 tablet (4 mg total) by mouth daily. 30 tablet 5   No current facility-administered medications for this visit.     Allergies:   Penicillins and Sulfur    Social History:  The patient  reports that he quit smoking about 57 years ago. He has a 0.13 pack-year smoking history. He has never used smokeless tobacco. He reports that he does not drink alcohol or use drugs.   Family History:  The patient's family history includes Cancer in his brother; HIV/AIDS (age of onset: 45) in his daughter; Heart disease in his father; Other in his son; Stroke in his father.    ROS:  Please see the history of present illness.   Otherwise, review of systems are positive for none.   All other systems are reviewed and negative.    PHYSICAL EXAM: VS:  BP (!) 156/64   Pulse (!) 59   Ht 5\' 8"  (1.727 m)   Wt 240 lb (108.9 kg)   SpO2 95% Comment: on room air  BMI 36.49 kg/m  , BMI Body mass index is 36.49 kg/m. GENERAL:  Well appearing HEENT: Pupils equal round and reactive, fundi not visualized, oral mucosa unremarkable NECK:  No jugular venous distention, waveform within normal limits, carotid upstroke brisk and symmetric, no bruits, no  thyromegaly LYMPHATICS:  No cervical adenopathy LUNGS:  Clear to auscultation bilaterally HEART:  RRR.  PMI not displaced or sustained,S1 and S2 within normal limits, no S3, no S4, no clicks, no rubs, no murmurs ABD:  Central adiposity.  Positive bowel sounds normal in frequency in pitch, no bruits, no rebound, no guarding, no midline pulsatile mass, no hepatomegaly, no splenomegaly EXT:  2 plus pulses throughout, no edema, no cyanosis no clubbing SKIN:  No rashes no nodules NEURO:  Cranial nerves II through XII grossly intact, motor grossly intact throughout PSYCH:  Cognitively intact, oriented to  person place and time   EKG:  EKG is ordered today. The ekg ordered 02/25/16 demonstrates sinus rhythm. Rate 63 bpm. Nonspecific T wave flattening. 06/24/18: Sinus bradycardia.  Rate 59 bpm.    Echo 03/19/16: Study Conclusions  - Left ventricle: The cavity size was normal. There was  mild-moderate concentric hypertrophy. Systolic function was  normal. The estimated ejection fraction was in the range of 55%  to 60%. Wall motion was normal; there were no regional wall  motion abnormalities. Features are consistent with a pseudonormal  left ventricular filling pattern, with concomitant abnormal  relaxation and increased filling pressure (grade 2 diastolic  dysfunction). Doppler parameters are consistent with  indeterminate ventricular filling pressure. - Aortic valve: Transvalvular velocity was within the normal range.  There was no stenosis. There was no regurgitation. Valve area  (VTI): 3.1 cm^2. Valve area (Vmax): 3.11 cm^2. Valve area  (Vmean): 3.04 cm^2. - Mitral valve: Transvalvular velocity was within the normal range.  There was no evidence for stenosis. There was no regurgitation. - Left atrium: The appendage was moderately dilated. - Right ventricle: The cavity size was mildly dilated. Wall  thickness was normal. Systolic function was normal. - Tricuspid valve: There  was mild regurgitation. - Pulmonary arteries: Systolic pressure was within the normal  range. PA peak pressure: 35 mm Hg (S). - Inferior vena cava: The vessel was normal in size. The  respirophasic diameter changes were in the normal range (= 50%),  consistent with normal central venous pressure.  Exercise Myoview 03/19/16:   Nuclear stress EF: 59%. No wall motion abnormality  The left ventricular ejection fraction is normal (55-65%).  There was no ST segment deviation noted during stress. There were frequent PVCs during exercise, rare couplet  This is a low risk perfusion study, no ischemia identified. Question if frequent PVCs are contributing to dyspnea on exertion. Fair exercise effort of 6 minutes and 13 seconds.   Recent Labs: 08/31/2017: BUN 12; Creatinine, Ser 0.93; Hemoglobin 13.8; Platelets 207; Potassium 4.4; Sodium 133   02/16/2018: Total cholesterol 135, triglycerides 185, HDL 42, LDL 55 Potassium 4.3, creatinine 1.03 ALT 9.0   Lipid Panel    Component Value Date/Time   CHOL  10/17/2007 0540    118        ATP III CLASSIFICATION:  <200     mg/dL   Desirable  696-295  mg/dL   Borderline High  >=284    mg/dL   High   TRIG 132 44/10/270 0540   HDL 28 (L) 10/17/2007 0540   CHOLHDL 4.2 10/17/2007 0540   VLDL 20 10/17/2007 0540   LDLCALC  10/17/2007 0540    70        Total Cholesterol/HDL:CHD Risk Coronary Heart Disease Risk Table                     Men   Women  1/2 Average Risk   3.4   3.3      Wt Readings from Last 3 Encounters:  06/24/18 240 lb (108.9 kg)  09/02/17 245 lb 12.8 oz (111.5 kg)  08/31/17 245 lb 12.8 oz (111.5 kg)      ASSESSMENT AND PLAN:  # Chronic diastolic heart failure: # Shortness of breath: Symptoms are stable. Echo did show grade 2 diastolic dysfunction.   Continue BP management as above.    # Hypertension: Blood pressure remains poorly-controlled.  Continue amlodipine, hydralazine, metoprolol, valsartan/HCTZ.  We will add  doxazosin 4 mg nightly given that  he also reports frequent episodes of nocturia.  Ideally he would like to take fewer medications.  However we discussed the fact that his blood pressure is above goal now so before we start taking medications off we need to pursue his blood pressure under control.  I also recommended that he start working more on diet and exercise and weight loss.  This will ultimately be the key to getting him off some of his medicines.  # PVCs: Controlled on metoprolol.  # Hyperlipidemia:  Continue atorvastatin.  LDL was 55 01/2018.  # Obesity: [name] was encouraged to increase his physical activity to at least 30-40 minutes most days of the week.     Current medicines are reviewed at length with the patient today.  The patient does not have concerns regarding medicines.  The following changes have been made: Increase Hyzaar  Labs/ tests ordered today include:   No orders of the defined types were placed in this encounter.    Disposition:   FU with Kharter Sestak C. Duke Salviaandolph, MD, [facility] in 1-2 months.    Signed, Laurelai Lepp C. Duke Salviaandolph, MD, [facility]  06/25/2018 10:15 AM    [facility]

## 2018-06-24 NOTE — Patient Instructions (Signed)
Medication Instructions:  START DOXAZOSIN 4 MG DAILY   Labwork: NONE  Testing/Procedures: NONE  Follow-Up: Your physician recommends that you schedule a follow-up appointment in: 1-2 MONTHS  Any Other Special Instructions Will Be Listed Below (If Applicable).  MONITOR AND LOG YOUR BLOOD PRESSURE AT HOME, BRING READINGS TO FOLLOW UP    If you need a refill on your cardiac medications before your next appointment, please call your pharmacy.

## 2018-06-25 ENCOUNTER — Encounter: Payer: Self-pay | Admitting: Cardiovascular Disease

## 2018-07-01 ENCOUNTER — Other Ambulatory Visit: Payer: Self-pay

## 2018-07-01 ENCOUNTER — Encounter (HOSPITAL_BASED_OUTPATIENT_CLINIC_OR_DEPARTMENT_OTHER)
Admission: RE | Admit: 2018-07-01 | Discharge: 2018-07-01 | Disposition: A | Discharge status: Home / Self Care | Payer: Medicare Other | Source: Ambulatory Visit | Attending: Orthopedic Surgery | Admitting: Orthopedic Surgery

## 2018-07-01 ENCOUNTER — Encounter (HOSPITAL_BASED_OUTPATIENT_CLINIC_OR_DEPARTMENT_OTHER): Payer: Self-pay

## 2018-07-01 ENCOUNTER — Other Ambulatory Visit: Payer: Self-pay | Admitting: Orthopedic Surgery

## 2018-07-01 DIAGNOSIS — Z01812 Encounter for preprocedural laboratory examination: Secondary | ICD-10-CM | POA: Diagnosis present

## 2018-07-01 LAB — BASIC METABOLIC PANEL
ANION GAP: 11 (ref 5–15)
BUN: 16 mg/dL (ref 8–23)
CO2: 25 mmol/L (ref 22–32)
Calcium: 9.4 mg/dL (ref 8.9–10.3)
Chloride: 102 mmol/L (ref 98–111)
Creatinine, Ser: 0.94 mg/dL (ref 0.61–1.24)
Glucose, Bld: 105 mg/dL — ABNORMAL HIGH (ref 70–99)
POTASSIUM: 4.8 mmol/L (ref 3.5–5.1)
Sodium: 138 mmol/L (ref 135–145)

## 2018-07-06 ENCOUNTER — Encounter (HOSPITAL_BASED_OUTPATIENT_CLINIC_OR_DEPARTMENT_OTHER)
Admission: RE | Disposition: A | Discharge status: Home / Self Care | Payer: Self-pay | Source: Ambulatory Visit | Attending: Orthopedic Surgery

## 2018-07-06 ENCOUNTER — Ambulatory Visit (HOSPITAL_BASED_OUTPATIENT_CLINIC_OR_DEPARTMENT_OTHER)
Admission: RE | Admit: 2018-07-06 | Discharge: 2018-07-06 | Disposition: A | Discharge status: Home / Self Care | Payer: Medicare Other | Source: Ambulatory Visit | Attending: Orthopedic Surgery | Admitting: Orthopedic Surgery

## 2018-07-06 ENCOUNTER — Other Ambulatory Visit: Payer: Self-pay

## 2018-07-06 ENCOUNTER — Encounter (HOSPITAL_BASED_OUTPATIENT_CLINIC_OR_DEPARTMENT_OTHER): Payer: Self-pay | Admitting: Certified Registered"

## 2018-07-06 ENCOUNTER — Ambulatory Visit (HOSPITAL_BASED_OUTPATIENT_CLINIC_OR_DEPARTMENT_OTHER): Payer: Medicare Other | Admitting: Certified Registered"

## 2018-07-06 DIAGNOSIS — Z8249 Family history of ischemic heart disease and other diseases of the circulatory system: Secondary | ICD-10-CM | POA: Diagnosis not present

## 2018-07-06 DIAGNOSIS — Z6834 Body mass index (BMI) 34.0-34.9, adult: Secondary | ICD-10-CM | POA: Insufficient documentation

## 2018-07-06 DIAGNOSIS — Z800 Family history of malignant neoplasm of digestive organs: Secondary | ICD-10-CM | POA: Diagnosis not present

## 2018-07-06 DIAGNOSIS — Z823 Family history of stroke: Secondary | ICD-10-CM | POA: Diagnosis not present

## 2018-07-06 DIAGNOSIS — R0602 Shortness of breath: Secondary | ICD-10-CM | POA: Diagnosis not present

## 2018-07-06 DIAGNOSIS — E669 Obesity, unspecified: Secondary | ICD-10-CM | POA: Insufficient documentation

## 2018-07-06 DIAGNOSIS — J45909 Unspecified asthma, uncomplicated: Secondary | ICD-10-CM | POA: Insufficient documentation

## 2018-07-06 DIAGNOSIS — K219 Gastro-esophageal reflux disease without esophagitis: Secondary | ICD-10-CM | POA: Diagnosis not present

## 2018-07-06 DIAGNOSIS — Z8673 Personal history of transient ischemic attack (TIA), and cerebral infarction without residual deficits: Secondary | ICD-10-CM | POA: Diagnosis not present

## 2018-07-06 DIAGNOSIS — G5603 Carpal tunnel syndrome, bilateral upper limbs: Secondary | ICD-10-CM | POA: Diagnosis not present

## 2018-07-06 DIAGNOSIS — Z7902 Long term (current) use of antithrombotics/antiplatelets: Secondary | ICD-10-CM | POA: Diagnosis not present

## 2018-07-06 DIAGNOSIS — E785 Hyperlipidemia, unspecified: Secondary | ICD-10-CM | POA: Insufficient documentation

## 2018-07-06 DIAGNOSIS — E7800 Pure hypercholesterolemia, unspecified: Secondary | ICD-10-CM | POA: Insufficient documentation

## 2018-07-06 DIAGNOSIS — M1990 Unspecified osteoarthritis, unspecified site: Secondary | ICD-10-CM | POA: Insufficient documentation

## 2018-07-06 DIAGNOSIS — Z8489 Family history of other specified conditions: Secondary | ICD-10-CM | POA: Insufficient documentation

## 2018-07-06 DIAGNOSIS — I10 Essential (primary) hypertension: Secondary | ICD-10-CM | POA: Diagnosis not present

## 2018-07-06 DIAGNOSIS — E119 Type 2 diabetes mellitus without complications: Secondary | ICD-10-CM | POA: Insufficient documentation

## 2018-07-06 DIAGNOSIS — I2510 Atherosclerotic heart disease of native coronary artery without angina pectoris: Secondary | ICD-10-CM | POA: Diagnosis not present

## 2018-07-06 DIAGNOSIS — Z87891 Personal history of nicotine dependence: Secondary | ICD-10-CM | POA: Insufficient documentation

## 2018-07-06 HISTORY — PX: CARPAL TUNNEL RELEASE: SHX101

## 2018-07-06 SURGERY — CARPAL TUNNEL RELEASE
Anesthesia: Regional | Site: Wrist | Laterality: Right

## 2018-07-06 MED ORDER — BUPIVACAINE HCL (PF) 0.25 % IJ SOLN
INTRAMUSCULAR | Status: DC | PRN
  Administered 2018-07-06: 10 mL

## 2018-07-06 MED ORDER — VANCOMYCIN HCL IN DEXTROSE 1-5 GM/200ML-% IV SOLN
INTRAVENOUS | Status: AC
  Filled 2018-07-06: qty 200

## 2018-07-06 MED ORDER — VANCOMYCIN HCL IN DEXTROSE 1-5 GM/200ML-% IV SOLN
1000.0000 mg | INTRAVENOUS | Status: DC
  Administered 2018-07-06: 1500 mg via INTRAVENOUS

## 2018-07-06 MED ORDER — SCOPOLAMINE 1 MG/3DAYS TD PT72: 1.0000 | MEDICATED_PATCH | Freq: Once | TRANSDERMAL | Status: DC | PRN

## 2018-07-06 MED ORDER — MEPERIDINE HCL 25 MG/ML IJ SOLN: 6.2500 mg | INTRAMUSCULAR | Status: DC | PRN

## 2018-07-06 MED ORDER — HYDROMORPHONE HCL 1 MG/ML IJ SOLN: 0.2500 mg | INTRAMUSCULAR | Status: DC | PRN

## 2018-07-06 MED ORDER — VANCOMYCIN HCL IN DEXTROSE 500-5 MG/100ML-% IV SOLN
INTRAVENOUS | Status: AC
  Filled 2018-07-06: qty 100

## 2018-07-06 MED ORDER — ONDANSETRON HCL 4 MG/2ML IJ SOLN: 4.0000 mg | Freq: Once | INTRAMUSCULAR | Status: DC | PRN

## 2018-07-06 MED ORDER — LIDOCAINE HCL (CARDIAC) PF 100 MG/5ML IV SOSY
PREFILLED_SYRINGE | INTRAVENOUS | Status: DC | PRN
  Administered 2018-07-06: 30 mg via INTRAVENOUS

## 2018-07-06 MED ORDER — BUPIVACAINE HCL (PF) 0.25 % IJ SOLN
INTRAMUSCULAR | Status: AC
  Filled 2018-07-06: qty 30

## 2018-07-06 MED ORDER — LIDOCAINE HCL (PF) 0.5 % IJ SOLN
INTRAMUSCULAR | Status: DC | PRN
  Administered 2018-07-06: 1000 mL via INTRAVENOUS

## 2018-07-06 MED ORDER — CHLORHEXIDINE GLUCONATE 4 % EX LIQD: 60.0000 mL | Freq: Once | CUTANEOUS | Status: DC

## 2018-07-06 MED ORDER — PROPOFOL 500 MG/50ML IV EMUL
INTRAVENOUS | Status: DC | PRN
  Administered 2018-07-06: 75 ug/kg/min via INTRAVENOUS

## 2018-07-06 MED ORDER — FENTANYL CITRATE (PF) 100 MCG/2ML IJ SOLN
INTRAMUSCULAR | Status: AC
  Filled 2018-07-06: qty 2

## 2018-07-06 MED ORDER — MIDAZOLAM HCL 2 MG/2ML IJ SOLN: 1.0000 mg | INTRAMUSCULAR | Status: DC | PRN

## 2018-07-06 MED ORDER — LACTATED RINGERS IV SOLN
INTRAVENOUS | Status: DC
  Administered 2018-07-06: 08:00:00 via INTRAVENOUS

## 2018-07-06 MED ORDER — ONDANSETRON HCL 4 MG/2ML IJ SOLN
INTRAMUSCULAR | Status: DC | PRN
  Administered 2018-07-06: 4 mg via INTRAVENOUS

## 2018-07-06 MED ORDER — TRAMADOL HCL 50 MG PO TABS: 50.0000 mg | ORAL_TABLET | Freq: Four times a day (QID) | ORAL | 0 refills | Status: DC | PRN

## 2018-07-06 MED ORDER — FENTANYL CITRATE (PF) 100 MCG/2ML IJ SOLN
50.0000 ug | INTRAMUSCULAR | Status: DC | PRN
  Administered 2018-07-06: 50 ug via INTRAVENOUS

## 2018-07-06 MED ORDER — VANCOMYCIN HCL 10 G IV SOLR: 1500.0000 mg | Freq: Once | INTRAVENOUS | Status: DC

## 2018-07-06 SURGICAL SUPPLY — 36 items
BLADE SURG 15 STRL LF DISP TIS (BLADE) ×1 IMPLANT
BLADE SURG 15 STRL SS (BLADE) ×2
BNDG CMPR 9X4 STRL LF SNTH (GAUZE/BANDAGES/DRESSINGS)
BNDG COHESIVE 3X5 TAN STRL LF (GAUZE/BANDAGES/DRESSINGS) ×2 IMPLANT
BNDG ESMARK 4X9 LF (GAUZE/BANDAGES/DRESSINGS) IMPLANT
BNDG GAUZE ELAST 4 BULKY (GAUZE/BANDAGES/DRESSINGS) ×2 IMPLANT
CHLORAPREP W/TINT 26ML (MISCELLANEOUS) ×2 IMPLANT
CORD BIPOLAR FORCEPS 12FT (ELECTRODE) ×2 IMPLANT
COVER BACK TABLE 60X90IN (DRAPES) ×2 IMPLANT
COVER MAYO STAND STRL (DRAPES) ×2 IMPLANT
CUFF TOURNIQUET SINGLE 18IN (TOURNIQUET CUFF) ×2 IMPLANT
DRAPE EXTREMITY T 121X128X90 (DRAPE) ×2 IMPLANT
DRAPE SURG 17X23 STRL (DRAPES) ×2 IMPLANT
DRSG PAD ABDOMINAL 8X10 ST (GAUZE/BANDAGES/DRESSINGS) ×2 IMPLANT
GAUZE SPONGE 4X4 12PLY STRL (GAUZE/BANDAGES/DRESSINGS) ×2 IMPLANT
GAUZE XEROFORM 1X8 LF (GAUZE/BANDAGES/DRESSINGS) ×2 IMPLANT
GLOVE BIO SURGEON STRL SZ 6.5 (GLOVE) ×1 IMPLANT
GLOVE BIOGEL PI IND STRL 7.0 (GLOVE) IMPLANT
GLOVE BIOGEL PI IND STRL 8.5 (GLOVE) ×1 IMPLANT
GLOVE BIOGEL PI INDICATOR 7.0 (GLOVE) ×2
GLOVE BIOGEL PI INDICATOR 8.5 (GLOVE) ×1
GLOVE SURG ORTHO 8.0 STRL STRW (GLOVE) ×2 IMPLANT
GOWN STRL REUS W/ TWL LRG LVL3 (GOWN DISPOSABLE) ×1 IMPLANT
GOWN STRL REUS W/TWL LRG LVL3 (GOWN DISPOSABLE) ×2
GOWN STRL REUS W/TWL XL LVL3 (GOWN DISPOSABLE) ×2 IMPLANT
NDL PRECISIONGLIDE 27X1.5 (NEEDLE) IMPLANT
NEEDLE PRECISIONGLIDE 27X1.5 (NEEDLE) ×2 IMPLANT
NS IRRIG 1000ML POUR BTL (IV SOLUTION) ×2 IMPLANT
PACK BASIN DAY SURGERY FS (CUSTOM PROCEDURE TRAY) ×2 IMPLANT
STOCKINETTE 4X48 STRL (DRAPES) ×2 IMPLANT
SUT ETHILON 4 0 PS 2 18 (SUTURE) ×2 IMPLANT
SUT VICRYL 4-0 PS2 18IN ABS (SUTURE) IMPLANT
SYR BULB 3OZ (MISCELLANEOUS) ×2 IMPLANT
SYR CONTROL 10ML LL (SYRINGE) ×1 IMPLANT
TOWEL GREEN STERILE FF (TOWEL DISPOSABLE) ×2 IMPLANT
UNDERPAD 30X30 (UNDERPADS AND DIAPERS) ×2 IMPLANT

## 2018-07-06 NOTE — Anesthesia Procedure Notes (Signed)
Procedure Name: MAC Date/Time: 07/06/2018 8:48 AM Performed by: Signe Colt, CRNA Pre-anesthesia Checklist: Patient identified, Emergency Drugs available, Suction available, Patient being monitored and Timeout performed Patient Re-evaluated:Patient Re-evaluated prior to induction Oxygen Delivery Method: Simple face mask

## 2018-07-06 NOTE — Anesthesia Postprocedure Evaluation (Signed)
Anesthesia Post Note  Patient: Jeff Watkins  Procedure(s) Performed: RIGHT CARPAL TUNNEL RELEASE, EXTENDED EXCISION (Right Wrist)     Patient location during evaluation: PACU Anesthesia Type: Bier Block Level of consciousness: awake and alert Pain management: pain level controlled Vital Signs Assessment: post-procedure vital signs reviewed and stable Respiratory status: spontaneous breathing, nonlabored ventilation, respiratory function stable and patient connected to nasal cannula oxygen Cardiovascular status: stable and blood pressure returned to baseline Postop Assessment: no apparent nausea or vomiting Anesthetic complications: no    Last Vitals:  Vitals:   07/06/18 0937 07/06/18 0949  BP:  (!) 142/89  Pulse: (!) 54 (!) 58  Resp: 18 16  Temp:  36.5 C  SpO2: 99% 96%    Last Pain:  Vitals:   07/06/18 0949  TempSrc:   PainSc: 0-No pain                 Yaseen Gilberg DAVID

## 2018-07-06 NOTE — Transfer of Care (Signed)
Immediate Anesthesia Transfer of Care Note  Patient: Jeff Watkins  Procedure(s) Performed: RIGHT CARPAL TUNNEL RELEASE, EXTENDED EXCISION (Right Wrist)  Patient Location: PACU  Anesthesia Type:Bier block  Level of Consciousness: awake, alert , oriented and patient cooperative  Airway & Oxygen Therapy: Patient Spontanous Breathing and Patient connected to face mask oxygen  Post-op Assessment: Report given to RN and Post -op Vital signs reviewed and stable  Post vital signs: Reviewed and stable  Last Vitals:  Vitals Value Taken Time  BP    Temp    Pulse 72 07/06/2018  9:11 AM  Resp 8 07/06/2018  9:11 AM  SpO2 97 % 07/06/2018  9:11 AM  Vitals shown include unvalidated device data.  Last Pain:  Vitals:   07/06/18 0740  TempSrc: Oral  PainSc: 0-No pain      Patients Stated Pain Goal: 0 (07/06/18 0740)  Complications: No apparent anesthesia complications

## 2018-07-06 NOTE — Op Note (Signed)
NAME: Jeff Watkins MEDICAL RECORD NO: [id number] DATE OF BIRTH: 1936-02-05 FACILITY: [facility] LOCATION: [facility] PHYSICIAN: Nicki Reaper, MD   OPERATIVE REPORT   DATE OF PROCEDURE: 07/06/18    PREOPERATIVE DIAGNOSIS:   Carpal tunnel syndrome right hand post distal radius fracture   POSTOPERATIVE DIAGNOSIS:   Same   PROCEDURE:   Decompression median nerve right wrist   SURGEON: Cindee Salt, M.D.   ASSISTANT: none   ANESTHESIA:  Bier block with sedation and Local   INTRAVENOUS FLUIDS:  Per anesthesia flow sheet.   ESTIMATED BLOOD LOSS:  Minimal.   COMPLICATIONS:  None.   SPECIMENS:  none   TOURNIQUET TIME:    Total Tourniquet Time Documented: Upper Arm (Right) - 30 minutes Total: Upper Arm (Right) - 30 minutes    DISPOSITION:  Stable to PACU.   INDICATIONS: History the patient is a 82 year old male who sustained a fracture of his right wrist he has developed carpal tunnel syndrome with mild malunion of the distal radius this is not responded to conservative treatment.  Nerve conductions are positive revealing severe carpal tunnel syndrome.  He has elected to undergo decompression of the nerve.  Pre-peri-and postoperative course been discussed along with risks and complications.  He is aware that there is no guarantee to the surgery the possibility of infection recurrence injury to arteries nerves tendons complete relief symptoms and dystrophy.  In the preoperative area the patient is seen extremity marked by both patient and surgeon antibiotic given  OPERATIVE COURSE: Patient brought to the operating room where a forearm-based IV regional anesthetic was carried out without difficulty.  He was placed in supine position prepped with ChloraPrep timeout was taken in 3-minute dry time allowed.  He was sedated.  A an incision was made in the palm carried down through subcutaneous tissue this was brought to the ulnar side of the wrist and then back onto the distal  forearm over the area of the fracture.  This carried down through subcutaneous tissue.  Bleeders were electrocauterized with bipolar.  The palmar fascia was split.  Retractors were placed.  The flexor tendon and the ring little finger I was identified distally.  Retractors were placed retracting median nerve radially ulnar nerve ulnarly the flexor retinaculum was then released on its ulnar border a very significant synovial fluid collection in the tennis tendons was then expressed.  The dissection was carried proximally with sharp dissection of the flexor retinaculum off the ulnar border.  The distal forearm fascia was then released proximally.  The nerve was reported around the malunion with an area of compression immediately visible with hyperemia non-hourglass deformity.  Motor branch entered in the muscle distally.  No further lesions were identified.  The wound was then copiously irrigated with saline.  The skin was then closed with interrupted 4 nylon sutures.  A local infiltration quarter percent bupivacaine without epinephrine was given 10 cc was used.  A sterile compressive dressing with the fingers free was applied.  On deflation of the tourniquet all fingers immediately pink.  He was taken to the recovery room for observation in satisfactory condition.  He will be discharged home to return to [facility] in 1 week on Ultram.  He will take Tylenol for discomfort.  He is on Plavix.   Nicki Reaper, MD Electronically signed, 07/06/18

## 2018-07-06 NOTE — Brief Op Note (Signed)
07/06/2018  9:05 AM  PATIENT:  Laure Kidney  82 y.o. male  PRE-OPERATIVE DIAGNOSIS:  RIGHT CARPAL TUNNEL SYNDROME  POST-OPERATIVE DIAGNOSIS:  RIGHT CARPAL TUNNEL SYNDROME  PROCEDURE:  Procedure(s): RIGHT CARPAL TUNNEL RELEASE, EXTENDED EXCISION (Right)  SURGEON:  Surgeon(s) and Role:    * Cindee Salt, MD - Primary  PHYSICIAN ASSISTANT:   ASSISTANTS: none   ANESTHESIA:   local, regional and IV sedation  EBL:  ADMINISTERED:none  DRAINS: none   LOCAL MEDICATIONS USED:  BUPIVICAINE   SPECIMEN:  No Specimen  DISPOSITION OF SPECIMEN:  N/A  COUNTS:  YES  TOURNIQUET:   Total Tourniquet Time Documented: Upper Arm (Right) - 30 minutes Total: Upper Arm (Right) - 30 minutes   DICTATION: .Dragon Dictation  PLAN OF CARE: Discharge to home after PACU  PATIENT DISPOSITION:  PACU - hemodynamically stable.

## 2018-07-06 NOTE — Anesthesia Procedure Notes (Signed)
Anesthesia Regional Block: Bier block (IV Regional)   Pre-Anesthetic Checklist: ,, timeout performed, Correct Patient, Correct Site, Correct Laterality, Correct Procedure,, site marked, surgical consent,, at surgeon's request  Laterality: Right     Needles:  Injection technique: Single-shot  Needle Type: Other      Needle Gauge: 20     Additional Needles:   Procedures:,,,,, intact distal pulses, Esmarch exsanguination,, double tourniquet utilized  Narrative:   Performed by: Personally       

## 2018-07-06 NOTE — Anesthesia Preprocedure Evaluation (Signed)
Anesthesia Evaluation  Patient identified by MRN, date of birth, ID band Patient awake    Reviewed: Allergy & Precautions, NPO status , Patient's Chart, lab work & pertinent test results  Airway Mallampati: II  TM Distance: >3 FB Neck ROM: Full    Dental   Pulmonary asthma , former smoker,    Pulmonary exam normal        Cardiovascular hypertension, Pt. on medications Normal cardiovascular exam     Neuro/Psych CVA    GI/Hepatic GERD  Medicated and Controlled,  Endo/Other  diabetes, Type 2  Renal/GU      Musculoskeletal   Abdominal   Peds  Hematology   Anesthesia Other Findings   Reproductive/Obstetrics                             Anesthesia Physical Anesthesia Plan  ASA: II  Anesthesia Plan: Bier Block and Bier Block-LIDOCAINE ONLY   Post-op Pain Management:    Induction: Intravenous  PONV Risk Score and Plan: 1 and Treatment may vary due to age or medical condition  Airway Management Planned: Simple Face Mask  Additional Equipment:   Intra-op Plan:   Post-operative Plan:   Informed Consent: I have reviewed the patients History and Physical, chart, labs and discussed the procedure including the risks, benefits and alternatives for the proposed anesthesia with the patient or authorized representative who has indicated his/her understanding and acceptance.     Plan Discussed with: CRNA and Surgeon  Anesthesia Plan Comments:         Anesthesia Quick Evaluation

## 2018-07-06 NOTE — H&P (Signed)
Jeff Watkins is an 82 y.o. male.   Chief Complaint: numbness right  hand HPI: Jeff Watkins is an 82 yo male with continued complaints of numbness tingling of his hands bilaterally right greater than left. He is status post fracture of his left forearm. He has had nerve conductions done low-power revealing severe carpal tunnel syndrome on his right moderate on his left. This was done 2018. He complains of relatively constant numbness and tingling to the hands. Is not complaining of significant pain or discomfort has been taking Aleve he has had other conservative treatment for his hands. This not relieve symptoms for him. Not complaining of significant weakness. He has a history of diet-controlled diabetes. He has no history of gout arthritis or thyroid problems. Family history is negative. He is on Plavix   Past Medical History:  Diagnosis Date  . Arthritis   . Asthma   . Bronchitis   . Carpal tunnel syndrome, bilateral   . Coronary artery disease    50-70% LAD by 2001 cath  . Diabetes mellitus without complication (HCC)   . Essential hypertension 02/25/2016  . GERD (gastroesophageal reflux disease)   . Hypercholesteremia   . Hyperlipidemia 02/25/2016  . Hypertension   . Obesity (BMI 30-39.9) 02/25/2016  . Shortness of breath 02/25/2016  . Stroke [facility]) ~2010   mini stroke     Past Surgical History:  Procedure Laterality Date  . CARDIAC CATHETERIZATION  2001  . COLONOSCOPY WITH PROPOFOL N/A 05/21/2015   Procedure: COLONOSCOPY WITH PROPOFOL;  Surgeon: [name], MD;  Location: [facility];  Service: Endoscopy;  Laterality: N/A;  . EYE SURGERY     bilateral cataracts  . FRACTURE SURGERY  1966   compound arm  . HERNIA REPAIR    . LAPAROSCOPIC APPENDECTOMY N/A 10/18/2013   Procedure: APPENDECTOMY LAPAROSCOPIC;  Surgeon: [name], MD;  Location: [facility] OR;  Service: General;  Laterality: N/A;  . SUBMANDIBULAR GLAND EXCISION Left 09/02/2017    Left submandibular gland and associated soft  tissue and lymph nodes  . SUBMANDIBULAR GLAND EXCISION Left 09/02/2017   Procedure: EXCISION LEFT SUBMANDIBULAR GLAND;  Surgeon: Serena Colonel, MD;  Location: [facility] OR;  Service: ENT;  Laterality: Left;  . TESTICLE REMOVAL  1965    Family History  Problem Relation Age of Onset  . Heart disease Father   . Stroke Father   . Cancer Brother        colon  . Other Son        Paraplegic from motorcycle accident  . HIV/AIDS Daughter 21   Social History:  reports that he quit smoking about 57 years ago. He has a 0.13 pack-year smoking history. He has never used smokeless tobacco. He reports that he does not drink alcohol or use drugs.  Allergies:  Allergies  Allergen Reactions  . Penicillins Rash  . Sulfur Hives    In genital area accompanied by blisters    No medications prior to admission.    No results found for this or any previous visit (from the past 48 hour(s)).  No results found.   Pertinent items are noted in HPI.  Height 5\' 8"  (1.727 m), weight 104.3 kg.  General appearance: alert, cooperative and appears stated age Head: Normocephalic, without obvious abnormality Neck: no JVD Resp: clear to auscultation bilaterally Cardio: regular rate and rhythm, S1, S2 normal, no murmur, click, rub or gallop GI: soft, non-tender; bowel sounds normal; no masses,  no organomegaly Extremities: numbness right  hand Pulses:  2+ and symmetric Skin: Skin color, texture, turgor normal. No rashes or lesions Neurologic: Grossly normal Incision/Wound: na  Assessment/Plan Diagnosis carpal tunnel syndrome Plan: He would like to have his right carpal tunnel release. Pre-peri-postoperative course are discussed along with risks and complications. He is aware that there is no guarantee to the surgery the possibility of infection recurrence injury to arteries nerves tendons complete relief symptoms dystrophy. Have left side done as rapidly as possible afterwards. He is advised to be done at 3 weeks.  He is scheduled for right carpal tunnel release in outpatient under regional anesthesia we will plan on extended incision on his right side.   Brandie Lopes R 07/06/2018, 3:58 AM

## 2018-07-06 NOTE — Discharge Instructions (Addendum)

## 2018-07-07 ENCOUNTER — Encounter (HOSPITAL_BASED_OUTPATIENT_CLINIC_OR_DEPARTMENT_OTHER): Payer: Self-pay | Admitting: Orthopedic Surgery

## 2018-08-30 ENCOUNTER — Ambulatory Visit: Payer: Medicare Other | Admitting: Cardiovascular Disease

## 2018-12-20 ENCOUNTER — Other Ambulatory Visit: Payer: Self-pay | Admitting: Cardiovascular Disease

## 2019-04-06 ENCOUNTER — Other Ambulatory Visit: Payer: Self-pay

## 2019-04-06 ENCOUNTER — Encounter (HOSPITAL_BASED_OUTPATIENT_CLINIC_OR_DEPARTMENT_OTHER): Payer: Self-pay | Admitting: *Deleted

## 2019-04-08 ENCOUNTER — Other Ambulatory Visit: Payer: Self-pay

## 2019-04-08 ENCOUNTER — Other Ambulatory Visit (HOSPITAL_COMMUNITY)
Admission: RE | Admit: 2019-04-08 | Discharge: 2019-04-08 | Disposition: A | Discharge status: Home / Self Care | Payer: Medicare Other | Source: Ambulatory Visit | Attending: Orthopedic Surgery | Admitting: Orthopedic Surgery

## 2019-04-08 ENCOUNTER — Encounter (HOSPITAL_BASED_OUTPATIENT_CLINIC_OR_DEPARTMENT_OTHER)
Admission: RE | Admit: 2019-04-08 | Discharge: 2019-04-08 | Disposition: A | Discharge status: Home / Self Care | Payer: Medicare Other | Source: Ambulatory Visit | Attending: Orthopedic Surgery | Admitting: Orthopedic Surgery

## 2019-04-08 DIAGNOSIS — Z01812 Encounter for preprocedural laboratory examination: Secondary | ICD-10-CM | POA: Insufficient documentation

## 2019-04-08 DIAGNOSIS — Z1159 Encounter for screening for other viral diseases: Secondary | ICD-10-CM | POA: Insufficient documentation

## 2019-04-09 LAB — NOVEL CORONAVIRUS, NAA (HOSP ORDER, SEND-OUT TO REF LAB; TAT 18-24 HRS): SARS-CoV-2, NAA: NOT DETECTED

## 2019-04-11 ENCOUNTER — Other Ambulatory Visit: Payer: Self-pay | Admitting: Orthopedic Surgery

## 2019-04-11 NOTE — Anesthesia Preprocedure Evaluation (Addendum)
Anesthesia Evaluation  Patient identified by MRN, date of birth, ID band Patient awake    Reviewed: Allergy & Precautions, NPO status , Patient's Chart, lab work & pertinent test results, reviewed documented beta blocker date and time   History of Anesthesia Complications Negative for: history of anesthetic complications  Airway Mallampati: II  TM Distance: >3 FB Neck ROM: Full    Dental no notable dental hx.    Pulmonary asthma , sleep apnea , former smoker,    Pulmonary exam normal        Cardiovascular hypertension, Pt. on medications and Pt. on home beta blockers + CAD  Normal cardiovascular exam  TTE 2017: mild-moderate concentric hypertrophy, EF 24-40%, grade 2 diastolic dysfunction   Neuro/Psych CVA (2010), No Residual Symptoms    GI/Hepatic Neg liver ROS, GERD  ,  Endo/Other  negative endocrine ROS  Renal/GU negative Renal ROS     Musculoskeletal  (+) Arthritis ,   Abdominal   Peds  Hematology negative hematology ROS (+)   Anesthesia Other Findings Day of surgery medications reviewed with the patient.  Reproductive/Obstetrics                            Anesthesia Physical Anesthesia Plan  ASA: III  Anesthesia Plan: Bier Block and Bier Block-LIDOCAINE ONLY   Post-op Pain Management:    Induction:   PONV Risk Score and Plan: Treatment may vary due to age or medical condition and Propofol infusion  Airway Management Planned: Natural Airway and Simple Face Mask  Additional Equipment:   Intra-op Plan:   Post-operative Plan:   Informed Consent: I have reviewed the patients History and Physical, chart, labs and discussed the procedure including the risks, benefits and alternatives for the proposed anesthesia with the patient or authorized representative who has indicated his/her understanding and acceptance.       Plan Discussed with: CRNA  Anesthesia Plan Comments:         Anesthesia Quick Evaluation

## 2019-04-12 ENCOUNTER — Ambulatory Visit (HOSPITAL_BASED_OUTPATIENT_CLINIC_OR_DEPARTMENT_OTHER)
Admission: RE | Admit: 2019-04-12 | Discharge: 2019-04-12 | Disposition: A | Discharge status: Home / Self Care | Payer: Medicare Other | Attending: Orthopedic Surgery | Admitting: Orthopedic Surgery

## 2019-04-12 ENCOUNTER — Ambulatory Visit (HOSPITAL_BASED_OUTPATIENT_CLINIC_OR_DEPARTMENT_OTHER): Payer: Medicare Other | Admitting: Anesthesiology

## 2019-04-12 ENCOUNTER — Encounter (HOSPITAL_BASED_OUTPATIENT_CLINIC_OR_DEPARTMENT_OTHER): Payer: Self-pay | Admitting: *Deleted

## 2019-04-12 ENCOUNTER — Encounter (HOSPITAL_BASED_OUTPATIENT_CLINIC_OR_DEPARTMENT_OTHER)
Admission: RE | Disposition: A | Discharge status: Home / Self Care | Payer: Self-pay | Source: Home / Self Care | Attending: Orthopedic Surgery

## 2019-04-12 DIAGNOSIS — G4730 Sleep apnea, unspecified: Secondary | ICD-10-CM | POA: Insufficient documentation

## 2019-04-12 DIAGNOSIS — E7800 Pure hypercholesterolemia, unspecified: Secondary | ICD-10-CM | POA: Insufficient documentation

## 2019-04-12 DIAGNOSIS — E119 Type 2 diabetes mellitus without complications: Secondary | ICD-10-CM | POA: Diagnosis not present

## 2019-04-12 DIAGNOSIS — E669 Obesity, unspecified: Secondary | ICD-10-CM | POA: Insufficient documentation

## 2019-04-12 DIAGNOSIS — Z87891 Personal history of nicotine dependence: Secondary | ICD-10-CM | POA: Insufficient documentation

## 2019-04-12 DIAGNOSIS — E785 Hyperlipidemia, unspecified: Secondary | ICD-10-CM | POA: Insufficient documentation

## 2019-04-12 DIAGNOSIS — Z8673 Personal history of transient ischemic attack (TIA), and cerebral infarction without residual deficits: Secondary | ICD-10-CM | POA: Insufficient documentation

## 2019-04-12 DIAGNOSIS — Z79899 Other long term (current) drug therapy: Secondary | ICD-10-CM | POA: Diagnosis not present

## 2019-04-12 DIAGNOSIS — G5602 Carpal tunnel syndrome, left upper limb: Secondary | ICD-10-CM | POA: Diagnosis not present

## 2019-04-12 DIAGNOSIS — I2510 Atherosclerotic heart disease of native coronary artery without angina pectoris: Secondary | ICD-10-CM | POA: Diagnosis not present

## 2019-04-12 DIAGNOSIS — K219 Gastro-esophageal reflux disease without esophagitis: Secondary | ICD-10-CM | POA: Diagnosis not present

## 2019-04-12 DIAGNOSIS — Z7902 Long term (current) use of antithrombotics/antiplatelets: Secondary | ICD-10-CM | POA: Diagnosis not present

## 2019-04-12 DIAGNOSIS — I10 Essential (primary) hypertension: Secondary | ICD-10-CM | POA: Insufficient documentation

## 2019-04-12 DIAGNOSIS — J45909 Unspecified asthma, uncomplicated: Secondary | ICD-10-CM | POA: Diagnosis not present

## 2019-04-12 DIAGNOSIS — Z6836 Body mass index (BMI) 36.0-36.9, adult: Secondary | ICD-10-CM | POA: Diagnosis not present

## 2019-04-12 HISTORY — DX: Sleep apnea, unspecified: G47.30

## 2019-04-12 HISTORY — DX: Prediabetes: R73.03

## 2019-04-12 HISTORY — PX: CARPAL TUNNEL RELEASE: SHX101

## 2019-04-12 SURGERY — CARPAL TUNNEL RELEASE
Anesthesia: Regional | Site: Wrist | Laterality: Left

## 2019-04-12 MED ORDER — SCOPOLAMINE 1 MG/3DAYS TD PT72: 1.0000 | MEDICATED_PATCH | Freq: Once | TRANSDERMAL | Status: DC | PRN

## 2019-04-12 MED ORDER — OXYCODONE HCL 5 MG/5ML PO SOLN: 5.0000 mg | Freq: Once | ORAL | Status: DC | PRN

## 2019-04-12 MED ORDER — OXYCODONE HCL 5 MG PO TABS: 5.0000 mg | ORAL_TABLET | Freq: Once | ORAL | Status: DC | PRN

## 2019-04-12 MED ORDER — MIDAZOLAM HCL 2 MG/2ML IJ SOLN
1.0000 mg | INTRAMUSCULAR | Status: DC | PRN
  Administered 2019-04-12: 09:00:00 0.5 mg via INTRAVENOUS

## 2019-04-12 MED ORDER — ONDANSETRON HCL 4 MG/2ML IJ SOLN
INTRAMUSCULAR | Status: AC
  Filled 2019-04-12: qty 2

## 2019-04-12 MED ORDER — VANCOMYCIN HCL 1000 MG IV SOLR
INTRAVENOUS | Status: DC | PRN
  Administered 2019-04-12: 1000 mg via INTRAVENOUS

## 2019-04-12 MED ORDER — ONDANSETRON HCL 4 MG/2ML IJ SOLN
INTRAMUSCULAR | Status: DC | PRN
  Administered 2019-04-12: 4 mg via INTRAVENOUS

## 2019-04-12 MED ORDER — FENTANYL CITRATE (PF) 100 MCG/2ML IJ SOLN
INTRAMUSCULAR | Status: AC
  Filled 2019-04-12: qty 2

## 2019-04-12 MED ORDER — BUPIVACAINE HCL (PF) 0.25 % IJ SOLN
INTRAMUSCULAR | Status: AC
  Filled 2019-04-12: qty 120

## 2019-04-12 MED ORDER — PROPOFOL 10 MG/ML IV BOLUS
INTRAVENOUS | Status: DC | PRN
  Administered 2019-04-12: 20 mg via INTRAVENOUS
  Administered 2019-04-12: 10 mg via INTRAVENOUS
  Administered 2019-04-12: 20 mg via INTRAVENOUS

## 2019-04-12 MED ORDER — PROMETHAZINE HCL 25 MG/ML IJ SOLN: 6.2500 mg | INTRAMUSCULAR | Status: DC | PRN

## 2019-04-12 MED ORDER — VANCOMYCIN HCL IN DEXTROSE 1-5 GM/200ML-% IV SOLN
INTRAVENOUS | Status: AC
  Filled 2019-04-12: qty 200

## 2019-04-12 MED ORDER — BUPIVACAINE HCL (PF) 0.25 % IJ SOLN
INTRAMUSCULAR | Status: DC | PRN
  Administered 2019-04-12: 8 mL

## 2019-04-12 MED ORDER — LACTATED RINGERS IV SOLN
INTRAVENOUS | Status: DC
  Administered 2019-04-12: 08:00:00 via INTRAVENOUS

## 2019-04-12 MED ORDER — THROMBIN 5000 UNITS EX SOLR
CUTANEOUS | Status: AC
  Filled 2019-04-12: qty 5000

## 2019-04-12 MED ORDER — LIDOCAINE 2% (20 MG/ML) 5 ML SYRINGE
INTRAMUSCULAR | Status: AC
  Filled 2019-04-12: qty 5

## 2019-04-12 MED ORDER — PHENYLEPHRINE 40 MCG/ML (10ML) SYRINGE FOR IV PUSH (FOR BLOOD PRESSURE SUPPORT)
PREFILLED_SYRINGE | INTRAVENOUS | Status: AC
  Filled 2019-04-12: qty 10

## 2019-04-12 MED ORDER — FENTANYL CITRATE (PF) 100 MCG/2ML IJ SOLN: 25.0000 ug | INTRAMUSCULAR | Status: DC | PRN

## 2019-04-12 MED ORDER — FENTANYL CITRATE (PF) 100 MCG/2ML IJ SOLN
50.0000 ug | INTRAMUSCULAR | Status: DC | PRN
  Administered 2019-04-12: 50 ug via INTRAVENOUS

## 2019-04-12 MED ORDER — SUCCINYLCHOLINE CHLORIDE 200 MG/10ML IV SOSY
PREFILLED_SYRINGE | INTRAVENOUS | Status: AC
  Filled 2019-04-12: qty 10

## 2019-04-12 MED ORDER — PROPOFOL 500 MG/50ML IV EMUL
INTRAVENOUS | Status: AC
  Filled 2019-04-12: qty 50

## 2019-04-12 MED ORDER — MIDAZOLAM HCL 2 MG/2ML IJ SOLN
INTRAMUSCULAR | Status: AC
  Filled 2019-04-12: qty 2

## 2019-04-12 MED ORDER — EPHEDRINE 5 MG/ML INJ
INTRAVENOUS | Status: AC
  Filled 2019-04-12: qty 10

## 2019-04-12 SURGICAL SUPPLY — 39 items
APL PRP STRL LF DISP 70% ISPRP (MISCELLANEOUS) ×1
BLADE SURG 15 STRL LF DISP TIS (BLADE) ×1 IMPLANT
BLADE SURG 15 STRL SS (BLADE) ×2
BNDG CMPR 9X4 STRL LF SNTH (GAUZE/BANDAGES/DRESSINGS) ×1
BNDG COHESIVE 3X5 TAN STRL LF (GAUZE/BANDAGES/DRESSINGS) ×2 IMPLANT
BNDG ESMARK 4X9 LF (GAUZE/BANDAGES/DRESSINGS) ×1 IMPLANT
BNDG GAUZE ELAST 4 BULKY (GAUZE/BANDAGES/DRESSINGS) ×2 IMPLANT
CHLORAPREP W/TINT 26 (MISCELLANEOUS) ×2 IMPLANT
CORD BIPOLAR FORCEPS 12FT (ELECTRODE) ×2 IMPLANT
COVER BACK TABLE REUSABLE LG (DRAPES) ×2 IMPLANT
COVER MAYO STAND REUSABLE (DRAPES) ×2 IMPLANT
COVER WAND RF STERILE (DRAPES) IMPLANT
CUFF TOURN SGL QUICK 18X4 (TOURNIQUET CUFF) ×2 IMPLANT
DRAPE EXTREMITY T 121X128X90 (DISPOSABLE) ×2 IMPLANT
DRAPE SURG 17X23 STRL (DRAPES) ×2 IMPLANT
DRSG PAD ABDOMINAL 8X10 ST (GAUZE/BANDAGES/DRESSINGS) ×2 IMPLANT
GAUZE SPONGE 4X4 12PLY STRL (GAUZE/BANDAGES/DRESSINGS) ×2 IMPLANT
GAUZE XEROFORM 1X8 LF (GAUZE/BANDAGES/DRESSINGS) ×2 IMPLANT
GLOVE BIOGEL PI IND STRL 7.0 (GLOVE) IMPLANT
GLOVE BIOGEL PI IND STRL 8.5 (GLOVE) ×1 IMPLANT
GLOVE BIOGEL PI INDICATOR 7.0 (GLOVE) ×3
GLOVE BIOGEL PI INDICATOR 8.5 (GLOVE) ×1
GLOVE ECLIPSE 6.5 STRL STRAW (GLOVE) ×1 IMPLANT
GLOVE SURG ORTHO 8.0 STRL STRW (GLOVE) ×2 IMPLANT
GLOVE SURG SS PI 6.5 STRL IVOR (GLOVE) ×1 IMPLANT
GOWN STRL REUS W/ TWL LRG LVL3 (GOWN DISPOSABLE) ×1 IMPLANT
GOWN STRL REUS W/TWL LRG LVL3 (GOWN DISPOSABLE) ×4
GOWN STRL REUS W/TWL XL LVL3 (GOWN DISPOSABLE) ×2 IMPLANT
NDL PRECISIONGLIDE 27X1.5 (NEEDLE) IMPLANT
NEEDLE PRECISIONGLIDE 27X1.5 (NEEDLE) IMPLANT
NS IRRIG 1000ML POUR BTL (IV SOLUTION) ×2 IMPLANT
PACK BASIN DAY SURGERY FS (CUSTOM PROCEDURE TRAY) ×2 IMPLANT
STOCKINETTE 4X48 STRL (DRAPES) ×2 IMPLANT
SUT ETHILON 4 0 PS 2 18 (SUTURE) ×2 IMPLANT
SUT VICRYL 4-0 PS2 18IN ABS (SUTURE) IMPLANT
SYR BULB 3OZ (MISCELLANEOUS) ×2 IMPLANT
SYR CONTROL 10ML LL (SYRINGE) ×1 IMPLANT
TOWEL GREEN STERILE FF (TOWEL DISPOSABLE) ×2 IMPLANT
UNDERPAD 30X30 (UNDERPADS AND DIAPERS) ×1 IMPLANT

## 2019-04-12 NOTE — Discharge Instructions (Addendum)

## 2019-04-12 NOTE — Anesthesia Postprocedure Evaluation (Signed)
Anesthesia Post Note  Patient: [name]  Procedure(s) Performed: CARPAL TUNNEL RELEASE (Left Wrist)     Patient location during evaluation: PACU Anesthesia Type: Bier Block Level of consciousness: awake and alert Pain management: pain level controlled Vital Signs Assessment: post-procedure vital signs reviewed and stable Respiratory status: spontaneous breathing, nonlabored ventilation and respiratory function stable Cardiovascular status: blood pressure returned to baseline and stable Postop Assessment: no apparent nausea or vomiting Anesthetic complications: no    Last Vitals:  Vitals:   04/12/19 0945 04/12/19 1000  BP: (!) 123/57 (!) 141/86  Pulse: (!) 45 (!) 51  Resp: 18 18  Temp:  36.7 C  SpO2: 97% 97%    Last Pain:  Vitals:   04/12/19 1000  TempSrc:   PainSc: 0-No pain                 Brennan Bailey

## 2019-04-12 NOTE — Op Note (Signed)
NAME: Jeff Watkins RECORD NO: [id number] DATE OF BIRTH: 15-Jul-1936 FACILITY: [facility] LOCATION: [facility] PHYSICIAN: Wynonia Sours, MD   OPERATIVE REPORT   DATE OF PROCEDURE: 04/12/19    PREOPERATIVE DIAGNOSIS:   Carpal tunnel syndrome left hand   POSTOPERATIVE DIAGNOSIS: Same   PROCEDURE:   Decompression median nerve left hand   SURGEON: Daryll Brod, M.D.   ASSISTANT: none   ANESTHESIA:  Bier block with sedation and Local   INTRAVENOUS FLUIDS:  Per anesthesia flow sheet.   ESTIMATED BLOOD LOSS:  Minimal.   COMPLICATIONS:  None.   SPECIMENS:  none   TOURNIQUET TIME:    Total Tourniquet Time Documented: Forearm (Left) - 18 minutes Total: Forearm (Left) - 18 minutes    DISPOSITION:  Stable to PACU.   INDICATIONS: Patient is an 83 year old male with history of bilateral carpal tunnel syndrome nerve conductions positive.  This not responded to conservative treatment he is elected to undergo surgical release of the left side having undergone release to the right side in the past.  Pre-peri-and postoperative course been discussed along with risks and complications.  He is aware that there is no guarantee to the surgery the possibility of infection recurrence injury to arteries nerves tendons complete relief of symptoms and dystrophy.  Preoperative area the patient is seen extremity marked by both patient and surgeon antibiotic given  OPERATIVE COURSE: Patient is brought to the operating room where form based IV regional anesthetic was carried out without difficulty under the direction the anesthesia department.  He was prepped using ChloraPrep in the supine position with the left arm free.  A three-minute dry time was allowed timeout taken to confirm patient procedure.  A longitudinal incision was made left palm carried down through subcutaneous tissue.  Bleeders were electrocauterized with bipolar.  The palmar fascia was split.  Superficial palmar arch was  identified along with the flexor tendon to the ring finger.  Retractors were placed retracting the flexor tendons median nerve radially and ulnar nerve ulnarly.  The flexor retinaculum was then released on its ulnar border.  Right angle and stool retractor placed between skin and forearm fascia proximally remainder of the proximal retinaculum was then released along with the distal forearm fascia for approximately 2 cm proximal to the wrist crease under direct vision.  The canal was explored tenosynovial tissue was moderately thickened the area compression of the nerve is apparent motor branch entered the muscle distally.  The wound was copiously irrigated with saline.  Skin was closed and opted for nylon sutures.  Local infiltration quarter percent bupivacaine without epinephrine was given 8 cc was used.  A sterile compressive dressing with the fingers free was applied.  Inflation of the tourniquet all fingers immediately pink.  He was taken to the recovery room for observation in satisfactory condition.  He will be discharged home to return hand center [facility] in 1 week Tylenol ibuprofen he has Norco at home.   Daryll Brod, MD Electronically signed, 04/12/19

## 2019-04-12 NOTE — Transfer of Care (Signed)
Immediate Anesthesia Transfer of Care Note  Patient: Jeff Watkins  Procedure(s) Performed: CARPAL TUNNEL RELEASE (Left Wrist)  Patient Location: PACU  Anesthesia Type:MAC and Bier block  Level of Consciousness: awake, alert  and oriented  Airway & Oxygen Therapy: Patient Spontanous Breathing and Patient connected to face mask oxygen  Post-op Assessment: Report given to RN and Post -op Vital signs reviewed and stable  Post vital signs: Reviewed and stable  Last Vitals:  Vitals Value Taken Time  BP    Temp    Pulse    Resp    SpO2      Last Pain:  Vitals:   04/12/19 0737  TempSrc: Oral  PainSc: 0-No pain      Patients Stated Pain Goal: 0 (14/38/88 7579)  Complications: No apparent anesthesia complications

## 2019-04-12 NOTE — H&P (Signed)
Jeff Watkins is an 83 y.o. male.   Chief Complaint: numbness left hand [id number]:Jeff Watkins is an 83 year old male with a history of carpal tunnel syndrome bilaterally. He is undergoing carpal tunnel release right-hand. This is done well. He continues complaint of numbness and tingling on his left side. He has had nerve conduction's done in 2018 revealing severe carpal tunnel syndrome bilaterally with a motor delay is 6.5 he showed no ulnar nerve changes. He is no history of injury to his neck. He has a history of die control diabetes he is no history of arthritis gout or thyroid problem.[name]. His family history is negative for each. He is desirous proceeding to have the surgically released.  Past Medical History:  Diagnosis Date  . Arthritis   . Asthma   . Bronchitis   . Carpal tunnel syndrome, bilateral   . Coronary artery disease    50-70% LAD by 2001 cath  . Essential hypertension 02/25/2016  . GERD (gastroesophageal reflux disease)   . Hypercholesteremia   . Hyperlipidemia 02/25/2016  . Hypertension   . Obesity (BMI 30-39.9) 02/25/2016  . Pre-diabetes   . Shortness of breath 02/25/2016  . Sleep apnea    does not use CPAP  . Stroke [facility](HCC) ~2010   mini stroke     Past Surgical History:  Procedure Laterality Date  . CARDIAC CATHETERIZATION  2001  . CARPAL TUNNEL RELEASE Right 07/06/2018   Procedure: RIGHT CARPAL TUNNEL RELEASE, EXTENDED EXCISION;  Surgeon: Cindee SaltKuzma, Tamarah Bhullar, MD;  Location: [facility];  Service: Orthopedics;  Laterality: Right;  . COLONOSCOPY WITH PROPOFOL N/A 05/21/2015   Procedure: COLONOSCOPY WITH PROPOFOL;  Surgeon: Charolett BumpersMartin K Johnson, MD;  Location: [facility];  Service: Endoscopy;  Laterality: N/A;  . EYE SURGERY     bilateral cataracts  . FRACTURE SURGERY  1966   compound arm  . HERNIA REPAIR    . LAPAROSCOPIC APPENDECTOMY N/A 10/18/2013   Procedure: APPENDECTOMY LAPAROSCOPIC;  Surgeon: Cherylynn RidgesJames O Wyatt, MD;  Location: [facility] OR;  Service: General;  Laterality: N/A;  .  SUBMANDIBULAR GLAND EXCISION Left 09/02/2017    Left submandibular gland and associated soft tissue and lymph nodes  . SUBMANDIBULAR GLAND EXCISION Left 09/02/2017   Procedure: EXCISION LEFT SUBMANDIBULAR GLAND;  Surgeon: Serena Colonelosen, Jefry, MD;  [facility];  Service: ENT;  Laterality: Left;  . TESTICLE REMOVAL  1965    Family History  Problem Relation Age of Onset  . Heart disease Father   . Stroke Father   . Cancer Brother        colon  . Other Son        Paraplegic from motorcycle accident  . HIV/AIDS Daughter 1525   Social History:  reports that he quit smoking about 58 years ago. He has a 0.13 pack-year smoking history. He has never used smokeless tobacco. He reports that he does not drink alcohol or use drugs.  Allergies:  Allergies  Allergen Reactions  . Penicillins Rash  . Sulfur Hives    In genital area accompanied by blisters    No medications prior to admission.    No results found for this or any previous visit (from the past 48 hour(s)).  No results found.   Pertinent items are noted in HPI.  Height 5\' 8"  (1.727 m), weight 108.9 kg.  General appearance: alert, cooperative and appears stated age Head: Normocephalic, without obvious abnormality Neck: no JVD Resp: clear to auscultation bilaterally Cardio: regular rate and rhythm, S1, S2 normal, no murmur,  click, rub or gallop GI: soft, non-tender; bowel sounds normal; no masses,  no organomegaly Extremities: numbness left hand Pulses: 2+ and symmetric Skin: Skin color, texture, turgor normal. No rashes or lesions Neurologic: Grossly normal Incision/Wound: na  Assessment/Plan Carpal tunnel syndrome left hand.  Plan decompression median nerve left wrist. Pre-. Postoperative course been discussed along with risks and complications. He is aware that there is no guarantee to the surgery the possibility of infection recurrence injury to our reserves tendons in complete relief of symptoms and dystrophy. It's good  for left carpal tunnel release an outpatient under regional anesthesia.  Daryll Brod 04/12/2019, 5:02 AM

## 2019-04-12 NOTE — Brief Op Note (Signed)
04/12/2019  9:00 AM  PATIENT:  Jeff Watkins  83 y.o. male  PRE-OPERATIVE DIAGNOSIS:  LEFT CARPAL TUNNEL SYNDROME  POST-OPERATIVE DIAGNOSIS:  LEFT CARPAL TUNNEL SYNDROME  PROCEDURE:  Procedure(s): CARPAL TUNNEL RELEASE (Left)  SURGEON:  Surgeon(s) and Role:    * Daryll Brod, MD - Primary  PHYSICIAN ASSISTANT:   ASSISTANTS: none   ANESTHESIA:   local, regional and IV sedation  EBL:  1 mL   BLOOD ADMINISTERED:none  DRAINS: none   LOCAL MEDICATIONS USED:  BUPIVICAINE   SPECIMEN:  No Specimen  DISPOSITION OF SPECIMEN:  N/A  COUNTS:  YES  TOURNIQUET:   Total Tourniquet Time Documented: Forearm (Left) - 18 minutes Total: Forearm (Left) - 18 minutes   DICTATION: .Dragon Dictation  PLAN OF CARE: Discharge to home after PACU  PATIENT DISPOSITION:  PACU - hemodynamically stable.

## 2019-04-13 ENCOUNTER — Encounter (HOSPITAL_BASED_OUTPATIENT_CLINIC_OR_DEPARTMENT_OTHER): Payer: Self-pay | Admitting: Orthopedic Surgery

## 2019-05-20 ENCOUNTER — Other Ambulatory Visit: Payer: Self-pay | Admitting: Cardiovascular Disease

## 2019-06-11 ENCOUNTER — Other Ambulatory Visit: Payer: Self-pay | Admitting: Cardiovascular Disease

## 2019-11-08 ENCOUNTER — Other Ambulatory Visit: Payer: Self-pay | Admitting: Cardiovascular Disease

## 2020-01-23 DIAGNOSIS — D509 Iron deficiency anemia, unspecified: Secondary | ICD-10-CM | POA: Diagnosis not present

## 2020-01-23 DIAGNOSIS — E1122 Type 2 diabetes mellitus with diabetic chronic kidney disease: Secondary | ICD-10-CM | POA: Diagnosis not present

## 2020-01-23 DIAGNOSIS — M4722 Other spondylosis with radiculopathy, cervical region: Secondary | ICD-10-CM | POA: Diagnosis not present

## 2020-01-23 DIAGNOSIS — E038 Other specified hypothyroidism: Secondary | ICD-10-CM | POA: Diagnosis not present

## 2020-01-23 DIAGNOSIS — I2510 Atherosclerotic heart disease of native coronary artery without angina pectoris: Secondary | ICD-10-CM | POA: Diagnosis not present

## 2020-01-23 DIAGNOSIS — J45991 Cough variant asthma: Secondary | ICD-10-CM | POA: Diagnosis not present

## 2020-01-23 DIAGNOSIS — I10 Essential (primary) hypertension: Secondary | ICD-10-CM | POA: Diagnosis not present

## 2020-01-23 DIAGNOSIS — J45901 Unspecified asthma with (acute) exacerbation: Secondary | ICD-10-CM | POA: Diagnosis not present

## 2020-01-26 DIAGNOSIS — E669 Obesity, unspecified: Secondary | ICD-10-CM | POA: Diagnosis not present

## 2020-01-26 DIAGNOSIS — Z8249 Family history of ischemic heart disease and other diseases of the circulatory system: Secondary | ICD-10-CM | POA: Diagnosis not present

## 2020-01-26 DIAGNOSIS — Z6835 Body mass index (BMI) 35.0-35.9, adult: Secondary | ICD-10-CM | POA: Diagnosis not present

## 2020-01-26 DIAGNOSIS — Z8673 Personal history of transient ischemic attack (TIA), and cerebral infarction without residual deficits: Secondary | ICD-10-CM | POA: Diagnosis not present

## 2020-01-26 DIAGNOSIS — D6869 Other thrombophilia: Secondary | ICD-10-CM | POA: Diagnosis not present

## 2020-01-26 DIAGNOSIS — Z7902 Long term (current) use of antithrombotics/antiplatelets: Secondary | ICD-10-CM | POA: Diagnosis not present

## 2020-01-26 DIAGNOSIS — Z809 Family history of malignant neoplasm, unspecified: Secondary | ICD-10-CM | POA: Diagnosis not present

## 2020-01-26 DIAGNOSIS — Z87891 Personal history of nicotine dependence: Secondary | ICD-10-CM | POA: Diagnosis not present

## 2020-01-26 DIAGNOSIS — I509 Heart failure, unspecified: Secondary | ICD-10-CM | POA: Diagnosis not present

## 2020-01-26 DIAGNOSIS — K219 Gastro-esophageal reflux disease without esophagitis: Secondary | ICD-10-CM | POA: Diagnosis not present

## 2020-01-26 DIAGNOSIS — I4891 Unspecified atrial fibrillation: Secondary | ICD-10-CM | POA: Diagnosis not present

## 2020-01-26 DIAGNOSIS — Z823 Family history of stroke: Secondary | ICD-10-CM | POA: Diagnosis not present

## 2020-01-26 DIAGNOSIS — E261 Secondary hyperaldosteronism: Secondary | ICD-10-CM | POA: Diagnosis not present

## 2020-01-26 DIAGNOSIS — I110 Hypertensive heart disease with heart failure: Secondary | ICD-10-CM | POA: Diagnosis not present

## 2020-01-26 DIAGNOSIS — E785 Hyperlipidemia, unspecified: Secondary | ICD-10-CM | POA: Diagnosis not present

## 2020-01-26 DIAGNOSIS — I951 Orthostatic hypotension: Secondary | ICD-10-CM | POA: Diagnosis not present

## 2020-02-02 ENCOUNTER — Other Ambulatory Visit: Payer: Self-pay | Admitting: Cardiovascular Disease

## 2020-02-24 ENCOUNTER — Encounter: Payer: Self-pay | Admitting: Cardiovascular Disease

## 2020-02-24 ENCOUNTER — Ambulatory Visit (INDEPENDENT_AMBULATORY_CARE_PROVIDER_SITE_OTHER): Payer: Medicare PPO | Admitting: Cardiovascular Disease

## 2020-02-24 ENCOUNTER — Other Ambulatory Visit: Payer: Self-pay

## 2020-02-24 VITALS — BP 132/58 | HR 66 | Temp 95.9°F | Ht 68.0 in | Wt 238.0 lb

## 2020-02-24 DIAGNOSIS — R8290 Unspecified abnormal findings in urine: Secondary | ICD-10-CM

## 2020-02-24 DIAGNOSIS — I10 Essential (primary) hypertension: Secondary | ICD-10-CM

## 2020-02-24 DIAGNOSIS — I5032 Chronic diastolic (congestive) heart failure: Secondary | ICD-10-CM

## 2020-02-24 DIAGNOSIS — E785 Hyperlipidemia, unspecified: Secondary | ICD-10-CM

## 2020-02-24 DIAGNOSIS — R0602 Shortness of breath: Secondary | ICD-10-CM

## 2020-02-24 MED ORDER — HYDRALAZINE HCL 100 MG PO TABS: 100.0000 mg | ORAL_TABLET | Freq: Two times a day (BID) | ORAL | 1 refills | Status: DC

## 2020-02-24 NOTE — Progress Notes (Signed)
h   Cardiology Office Note   Date:  02/24/20  ID:  Jeff Watkins, DOB 1936-07-01, MRN [id number]  PCP:  Leeroy Cha, MD  Cardiologist:   Skeet Latch, MD   Chief Complaint  Patient presents with  . Follow-up    Last seen in 2019.     History of Present Illness: Jeff Watkins is a 84 y.o. male with CAD, hypertension, hyperlipidemia, OSA, GERD and diabetes who presents for follow up.  Mr. Jeff was first seen on 5/1 with a complaint of dyspnea on exertion.  He was referred for an echo that showed LVEF 55-60% and grade 2 diastolic dysfunction.  He also had an exercise Myoview that was negative for ischemia but did reveal frequent PVCs.  He exercised for 6 minutes on the Bruce protocol (5.4 METS). At that appointment his losartan/HCTZ was increased due to poorly-controlled hypertension.  His blood pressure continues to be elevated.  Since his last appointment his primary care physician increase hydralazine to 50 mill grams twice daily.  He has not been checking his blood pressure much lately but thinks it is been consistently elevated.  He has not had any palpitations lately.  He denies lower extremity edema, orthopnea, or PND.  He has not been getting much exercise.  He is somewhat limited by right hip pain.  He has arthritis and needs an injection soon.  At his last appointment Mr. Watkins had PVCs and his BP was poorly controlled so metoprolol was increased.  Since that time he had a submandibular mass removed 08/2017.  Pathology was benign.  He cares for his wife who has visual impairment.  He is active around the house but does not formally exercise.  He struggles with his weight and reports that his diet is pretty healthy.  Since his last appointment he had a home health visit through his insurance.  He was noted to have some mild edema and his BP was elevated.  He doesn't check his BP at home very often.  Yesterday at the drug store it was 163/53.  Since his last appointment he also  had carpal tunnel surgery.  He has had carpal tunnel in both arms.  He notes that his diet has been pretty good.  He has been limiting his portions and trying to eat fewer meals.  He has been able to lose weight by these efforts.  He has not been exercising much but does do some chores around the house.  He denies any chest pain but does have exertional dyspnea.  He has intermittent lower extremity edema but no orthopnea or PND.  His wife notes that he has changed a lot in the last year he has to rest more and has an intention tremor.  She is barely able to recognize his handwriting.  He has access to the [employer] sne[employer] program but is not using it.  He notes that since starting doxazosin his urine has had a foul odor to it he but denies any dysuria.   Past Medical History:  Diagnosis Date  . Arthritis   . Asthma   . Bronchitis   . Carpal tunnel syndrome, bilateral   . Chronic diastolic heart failure ([place]) 03/07/2020  . Coronary artery disease    50-70% LAD by 2001 cath  . Essential hypertension 02/25/2016  . GERD (gastroesophageal reflux disease)   . Hypercholesteremia   . Hyperlipidemia 02/25/2016  . Hypertension   . Obesity (BMI 30-39.9) 02/25/2016  . Pre-diabetes   .  Shortness of breath 02/25/2016  . Sleep apnea    does not use CPAP  . Stroke [facility]) ~2010   mini stroke     Past Surgical History:  Procedure Laterality Date  . CARDIAC CATHETERIZATION  2001  . CARPAL TUNNEL RELEASE Right 07/06/2018   Procedure: RIGHT CARPAL TUNNEL RELEASE, EXTENDED EXCISION;  Surgeon: Cindee Salt, MD;  Location: [facility];  Service: Orthopedics;  Laterality: Right;  . CARPAL TUNNEL RELEASE Left 04/12/2019   Procedure: CARPAL TUNNEL RELEASE;  Surgeon: Cindee Salt, MD;  Location: [facility];  Service: Orthopedics;  Laterality: Left;  . COLONOSCOPY WITH PROPOFOL N/A 05/21/2015   Procedure: COLONOSCOPY WITH PROPOFOL;  Surgeon: [name], MD;  Location: [facility];  Service:  Endoscopy;  Laterality: N/A;  . EYE SURGERY     bilateral cataracts  . FRACTURE SURGERY  1966   compound arm  . HERNIA REPAIR    . LAPAROSCOPIC APPENDECTOMY N/A 10/18/2013   Procedure: APPENDECTOMY LAPAROSCOPIC;  Surgeon: [name], MD;  Location: [facility] OR;  Service: General;  Laterality: N/A;  . SUBMANDIBULAR GLAND EXCISION Left 09/02/2017    Left submandibular gland and associated soft tissue and lymph nodes  . SUBMANDIBULAR GLAND EXCISION Left 09/02/2017   Procedure: EXCISION LEFT SUBMANDIBULAR GLAND;  Surgeon: Serena Colonel, MD;  Location: [facility] OR;  Service: ENT;  Laterality: Left;  . TESTICLE REMOVAL  1965     Current Outpatient Medications  Medication Sig Dispense Refill  . albuterol (PROVENTIL HFA;VENTOLIN HFA) 108 (90 BASE) MCG/ACT inhaler Inhale 1-2 puffs into the lungs every 6 (six) hours as needed for wheezing or shortness of breath. Reported on 05/08/2016    . amLODipine (NORVASC) 10 MG tablet Take 10 mg by mouth at bedtime.     [name] atorvastatin (LIPITOR) 40 MG tablet Take 40 mg by mouth at bedtime.     . clopidogrel (PLAVIX) 75 MG tablet Take 75 mg by mouth daily with breakfast.    . doxazosin (CARDURA) 4 MG tablet Take 1 tablet (4 mg total) by mouth daily. 90 tablet 1  . ferrous sulfate 325 (65 FE) MG tablet Take 325 mg by mouth daily with breakfast.    . Fish Oil-Cholecalciferol (FISH OIL + D3 PO) Take 1 capsule by mouth every morning.     . hydrALAZINE (APRESOLINE) 100 MG tablet Take 1 tablet (100 mg total) by mouth 2 (two) times daily. 180 tablet 1  . ibuprofen (ADVIL) 200 MG tablet Take 400 mg by mouth every 6 (six) hours as needed for moderate pain. Reported on 05/08/2016    . loratadine (CLARITIN) 10 MG tablet Take 10 mg by mouth every morning.     . metoprolol succinate (TOPROL-XL) 25 MG 24 hr tablet TAKE ONE (1) TABLET BY MOUTH EACH DAY 90 tablet 1  . Multiple Vitamin (MULTI VITAMIN MENS PO) Take 1 tablet by mouth every evening.     . pantoprazole (PROTONIX) 20 MG tablet  TAKE ONE (1) TABLET EACH DAY 30 tablet 0  . Polyethyl Glycol-Propyl Glycol (SYSTANE OP) Place 1 drop into both eyes at bedtime.     . valsartan-hydrochlorothiazide (DIOVAN-HCT) 320-25 MG tablet Take 1 tablet by mouth daily. 30 tablet 6   No current facility-administered medications for this visit.    Allergies:   Penicillins and Sulfur    Social History:  The patient  reports that he quit smoking about 59 years ago. He has a 0.13 pack-year smoking history. He has never used smokeless tobacco.  He reports that he does not drink alcohol or use drugs.   Family History:  The patient's family history includes Cancer in his brother; HIV/AIDS (age of onset: 32) in his daughter; Heart disease in his father; Other in his son; Stroke in his father.    ROS:  Please see the history of present illness.   Otherwise, review of systems are positive for none.   All other systems are reviewed and negative.    PHYSICAL EXAM: VS:  BP (!) 132/58 (BP Location: Right Arm, Patient Position: Sitting, Cuff Size: Normal)   Pulse 66   Temp (!) 95.9 F (35.5 C)   Ht 5\' 8"  (1.727 m)   Wt 238 lb (108 kg)   BMI 36.19 kg/m  , BMI Body mass index is 36.19 kg/m. GENERAL:  Well appearing HEENT: Pupils equal round and reactive, fundi not visualized, oral mucosa unremarkable NECK:  No jugular venous distention, waveform within normal limits, carotid upstroke brisk and symmetric, no bruits, no thyromegaly LYMPHATICS:  No cervical adenopathy LUNGS:  Clear to auscultation bilaterally HEART:  RRR.  PMI not displaced or sustained,S1 and S2 within normal limits, no S3, no S4, no clicks, no rubs, no murmurs ABD:  Flat, positive bowel sounds normal in frequency in pitch, no bruits, no rebound, no guarding, no midline pulsatile mass, no hepatomegaly, no splenomegaly EXT:  2 plus pulses throughout, trace edema, no cyanosis no clubbing SKIN:  No rashes no nodules NEURO:  Cranial nerves II through XII grossly intact, motor  grossly intact throughout PSYCH:  Cognitively intact, oriented to person place and time   EKG:  EKG is ordered today. The ekg ordered 02/25/16 demonstrates sinus rhythm. Rate 63 bpm. Nonspecific T wave flattening. 06/24/18: Sinus bradycardia.  Rate 59 bpm.   02/24/20: Sinus rhythm.  Rate 66 bpm.   Echo 03/19/16: Study Conclusions  - Left ventricle: The cavity size was normal. There was  mild-moderate concentric hypertrophy. Systolic function was  normal. The estimated ejection fraction was in the range of 55%  to 60%. Wall motion was normal; there were no regional wall  motion abnormalities. Features are consistent with a pseudonormal  left ventricular filling pattern, with concomitant abnormal  relaxation and increased filling pressure (grade 2 diastolic  dysfunction). Doppler parameters are consistent with  indeterminate ventricular filling pressure. - Aortic valve: Transvalvular velocity was within the normal range.  There was no stenosis. There was no regurgitation. Valve area  (VTI): 3.1 cm^2. Valve area (Vmax): 3.11 cm^2. Valve area  (Vmean): 3.04 cm^2. - Mitral valve: Transvalvular velocity was within the normal range.  There was no evidence for stenosis. There was no regurgitation. - Left atrium: The appendage was moderately dilated. - Right ventricle: The cavity size was mildly dilated. Wall  thickness was normal. Systolic function was normal. - Tricuspid valve: There was mild regurgitation. - Pulmonary arteries: Systolic pressure was within the normal  range. PA peak pressure: 35 mm Hg (S). - Inferior vena cava: The vessel was normal in size. The  respirophasic diameter changes were in the normal range (= 50%),  consistent with normal central venous pressure.  Exercise Myoview 03/19/16:   Nuclear stress EF: 59%. No wall motion abnormality  The left ventricular ejection fraction is normal (55-65%).  There was no ST segment deviation noted during stress.  There were frequent PVCs during exercise, rare couplet  This is a low risk perfusion study, no ischemia identified. Question if frequent PVCs are contributing to dyspnea on exertion. Fair exercise  effort of 6 minutes and 13 seconds.   Recent Labs: No results found for requested labs within last 8760 hours.   02/16/2018: Total cholesterol 135, triglycerides 185, HDL 42, LDL 55 Potassium 4.3, creatinine 1.03 ALT 9.0   Lipid Panel    Component Value Date/Time   CHOL  10/17/2007 0540    118        ATP III CLASSIFICATION:  <200     mg/dL   Desirable  353-299  mg/dL   Borderline High  >=242    mg/dL   High   TRIG 683 41/96/2229 0540   HDL 28 (L) 10/17/2007 0540   CHOLHDL 4.2 10/17/2007 0540   VLDL 20 10/17/2007 0540   LDLCALC  10/17/2007 0540    70        Total Cholesterol/HDL:CHD Risk Coronary Heart Disease Risk Table                     Men   Women  1/2 Average Risk   3.4   3.3      Wt Readings from Last 3 Encounters:  02/24/20 238 lb (108 kg)  04/12/19 240 lb 15.4 oz (109.3 kg)  07/06/18 229 lb 15 oz (104.3 kg)      ASSESSMENT AND PLAN:  # Chronic diastolic heart failure: # Shortness of breath:  Mr. Lankford has chronic LE edema.  Echo showed normal systolic function with LVH.  BP is difficult to control.  He had bilateral carpel tunnel and normal voltage on EKG despite LVH.  We will get a PYP scan to evaluate for ATTR amyloidosis.    # Hypertension: Blood pressure better controlled.  Doxazosin seems to be helping but BP running high at home.  Continue amlodipine, doxazosin, hydralazine, metoprolol, and valsartan/HCTZ.  Increase hydralazine to 100mg .  We will enroll him in our Advanced Hypertension Clinic.    He consents to be monitored in our remote patient monitoring program through [employer].  He will track his blood pressure twice daily and understands that these trends will help to adjust his medications as needed prior to his next appointment.  He is interested in  enrolling in the PREP exercise and nutrition program through the [facility].    # PVCs: Controlled on metoprolol.  # Hyperlipidemia:  Continue atorvastatin.  LDL was 55 01/2018.  Followed by his PCP.  # Obesity: Mr. Biela was encouraged to increase his physical activity to at least 30-40 minutes most days of the week.    # Malodorous urine: Check urinalysis.  Current medicines are reviewed at length with the patient today.  The patient does not have concerns regarding medicines.  The following changes have been made: Increase hydralazine.  Labs/ tests ordered today include:   Orders Placed This Encounter  Procedures  . Urinalysis  . MYOCARDIAL AMYLOID IMAGING PLANAR AND SPECT  . EKG 12-Lead  . ECHOCARDIOGRAM COMPLETE  . VAS Hyman Hopes RENAL ARTERY DUPLEX     Disposition:   FU with Yanelle Sousa C. [place], MD, [facility] in 4 months.  PharmD in 1 month.    Signed, Elza Varricchio C. [facility], MD, [facility]  03/07/2020 11:57 PM    [facility]

## 2020-02-24 NOTE — Patient Instructions (Addendum)
Medication Instructions:  INCREASE YOUR HYDRALAZINE TO 100 MG TWICE A DAY  TAKE 2 OF YOUR 50 MG TABLETS TWICE A DAY UNTIL YOU RUN OUT, NEX RX SENT TO PHARMACY    Labwork: URINALYSIS TODAY   Testing/Procedures: Your physician has requested that you have an echocardiogram. Echocardiography is a painless test that uses sound waves to create images of your heart. It provides your doctor with information about the size and shape of your heart and how well your heart's chambers and valves are working. This procedure takes approximately one hour. There are no restrictions for this procedure. [place] [address]  Your physician has requested that you have a renal artery duplex. During this test, an ultrasound is used to evaluate blood flow to the kidneys. Allow one hour for this exam. Do not eat after midnight the day before and avoid carbonated beverages. Take your medications as you usually do.  AMYLOID NUCLEAR TEST   Follow-Up:  Your physician recommends that you schedule a follow-up appointment in: [place] 03/30/2020 AT 2:00 PM   Your physician recommends that you schedule a follow-up appointment in: 4 MONTHS [address] [place], [place]    Special Instructions:   ONCE YOU RECEIVE YOUR HOME MONITOR KIT CHECK YOUR BLOOD PRESSURE TWICE A DAY   TRY TO GET 150 MINUTES OF EXERCISE EACH WEEK   TIWAN WILL BE CALLING TO ASK YOU SOME QUESTIONS ABOUT POTENTIAL COMMUNITY RESOURCES     DASH Eating Plan DASH stands for "Dietary Approaches to Stop Hypertension." The DASH eating plan is a healthy eating plan that has been shown to reduce high blood pressure (hypertension). It may also reduce your risk for type 2 diabetes, heart disease, and stroke. The DASH eating plan may also help with weight loss. What are tips for following this plan?  General guidelines  Avoid eating more than 2,300 mg (milligrams) of salt (sodium) a day. If you have hypertension, you may need  to reduce your sodium intake to 1,500 mg a day.  Limit alcohol intake to no more than 1 drink a day for nonpregnant women and 2 drinks a day for men. One drink equals 12 oz of beer, 5 oz of wine, or 1 oz of hard liquor.  Work with your health care provider to maintain a healthy body weight or to lose weight. Ask what an ideal weight is for you.  Get at least 30 minutes of exercise that causes your heart to beat faster (aerobic exercise) most days of the week. Activities may include walking, swimming, or biking.  Work with your health care provider or diet and nutrition specialist (dietitian) to adjust your eating plan to your individual calorie needs. Reading food labels   Check food labels for the amount of sodium per serving. Choose foods with less than 5 percent of the Daily Value of sodium. Generally, foods with less than 300 mg of sodium per serving fit into this eating plan.  To find whole grains, look for the word "whole" as the first word in the ingredient list. Shopping  Buy products labeled as "low-sodium" or "no salt added."  Buy fresh foods. Avoid canned foods and premade or frozen meals. Cooking  Avoid adding salt when cooking. Use salt-free seasonings or herbs instead of table salt or sea salt. Check with your health care provider or pharmacist before using salt substitutes.  Do not fry foods. Cook foods using healthy methods such as baking, boiling, grilling,  and broiling instead.  Cook with heart-healthy oils, such as olive, canola, soybean, or sunflower oil. Meal planning  Eat a balanced diet that includes: ? 5 or more servings of fruits and vegetables each day. At each meal, try to fill half of your plate with fruits and vegetables. ? Up to 6-8 servings of whole grains each day. ? Less than 6 oz of lean meat, poultry, or fish each day. A 3-oz serving of meat is about the same size as a deck of cards. One egg equals 1 oz. ? 2 servings of low-fat dairy each day. ? A  serving of nuts, seeds, or beans 5 times each week. ? Heart-healthy fats. Healthy fats called Omega-3 fatty acids are found in foods such as flaxseeds and coldwater fish, like sardines, salmon, and mackerel.  Limit how much you eat of the following: ? Canned or prepackaged foods. ? Food that is high in trans fat, such as fried foods. ? Food that is high in saturated fat, such as fatty meat. ? Sweets, desserts, sugary drinks, and other foods with added sugar. ? Full-fat dairy products.  Do not salt foods before eating.  Try to eat at least 2 vegetarian meals each week.  Eat more home-cooked food and less restaurant, buffet, and fast food.  When eating at a restaurant, ask that your food be prepared with less salt or no salt, if possible. What foods are recommended? The items listed may not be a complete list. Talk with your dietitian about what dietary choices are best for you. Grains Whole-grain or whole-wheat bread. Whole-grain or whole-wheat pasta. Brown rice. [place]. Bulgur. Whole-grain and low-sodium cereals. Pita bread. Low-fat, low-sodium crackers. Whole-wheat flour tortillas. Vegetables Fresh or frozen vegetables (raw, steamed, roasted, or grilled). Low-sodium or reduced-sodium tomato and vegetable juice. Low-sodium or reduced-sodium tomato sauce and tomato paste. Low-sodium or reduced-sodium canned vegetables. Fruits All fresh, dried, or frozen fruit. Canned fruit in natural juice (without added sugar). Meat and other protein foods Skinless chicken or [place]. Ground chicken or [place]. Pork with fat trimmed off. Fish and seafood. Egg whites. Dried beans, peas, or lentils. Unsalted nuts, nut butters, and seeds. Unsalted canned beans. Lean cuts of beef with fat trimmed off. Low-sodium, lean deli meat. Dairy Low-fat (1%) or fat-free (skim) milk. Fat-free, low-fat, or reduced-fat cheeses. Nonfat, low-sodium ricotta or cottage cheese. Low-fat or nonfat yogurt. Low-fat,  low-sodium cheese. Fats and oils Soft margarine without trans fats. Vegetable oil. Low-fat, reduced-fat, or light mayonnaise and salad dressings (reduced-sodium). Canola, safflower, olive, soybean, and sunflower oils. Avocado. Seasoning and other foods Herbs. Spices. Seasoning mixes without salt. Unsalted popcorn and pretzels. Fat-free sweets. What foods are not recommended? The items listed may not be a complete list. Talk with your dietitian about what dietary choices are best for you. Grains Baked goods made with fat, such as croissants, muffins, or some breads. Dry pasta or rice meal packs. Vegetables Creamed or fried vegetables. Vegetables in a cheese sauce. Regular canned vegetables (not low-sodium or reduced-sodium). Regular canned tomato sauce and paste (not low-sodium or reduced-sodium). Regular tomato and vegetable juice (not low-sodium or reduced-sodium). Angie Fava. Olives. Fruits Canned fruit in a light or heavy syrup. Fried fruit. Fruit in cream or butter sauce. Meat and other protein foods Fatty cuts of meat. Ribs. Fried meat. Berniece Salines. Sausage. Bologna and other processed lunch meats. Salami. Fatback. Hotdogs. Bratwurst. Salted nuts and seeds. Canned beans with added salt. Canned or smoked fish. Whole eggs or egg yolks. Chicken or [place]  with skin. Dairy Whole or 2% milk, cream, and half-and-half. Whole or full-fat cream cheese. Whole-fat or sweetened yogurt. Full-fat cheese. Nondairy creamers. Whipped toppings. Processed cheese and cheese spreads. Fats and oils Butter. Stick margarine. Lard. Shortening. Ghee. Bacon fat. Tropical oils, such as coconut, palm kernel, or palm oil. Seasoning and other foods Salted popcorn and pretzels. Onion salt, garlic salt, seasoned salt, table salt, and sea salt. Worcestershire sauce. Tartar sauce. Barbecue sauce. Teriyaki sauce. Soy sauce, including reduced-sodium. Steak sauce. Canned and packaged gravies. Fish sauce. Oyster sauce. Cocktail sauce.  Horseradish that you find on the shelf. Ketchup. Mustard. Meat flavorings and tenderizers. Bouillon cubes. Hot sauce and Tabasco sauce. Premade or packaged marinades. Premade or packaged taco seasonings. Relishes. Regular salad dressings. Where to find more information:  National Heart, Lung, and [place]: https://wilson-eaton.com/  American Heart Association: www.heart.org Summary  The DASH eating plan is a healthy eating plan that has been shown to reduce high blood pressure (hypertension). It may also reduce your risk for type 2 diabetes, heart disease, and stroke.  With the DASH eating plan, you should limit salt (sodium) intake to 2,300 mg a day. If you have hypertension, you may need to reduce your sodium intake to 1,500 mg a day.  When on the DASH eating plan, aim to eat more fresh fruits and vegetables, whole grains, lean proteins, low-fat dairy, and heart-healthy fats.  Work with your health care provider or diet and nutrition specialist (dietitian) to adjust your eating plan to your individual calorie needs. This information is not intended to replace advice given to you by your health care provider. Make sure you discuss any questions you have with your health care provider. Document Released: 10/02/2011 Document Revised: 09/25/2017 Document Reviewed: 10/06/2016 Elsevier Patient Education  2020 [employer].

## 2020-02-25 LAB — URINALYSIS
Bilirubin, UA: NEGATIVE
Glucose, UA: NEGATIVE
Ketones, UA: NEGATIVE
Leukocytes,UA: NEGATIVE
Nitrite, UA: NEGATIVE
Protein,UA: NEGATIVE
RBC, UA: NEGATIVE
Specific Gravity, UA: 1.021 (ref 1.005–1.030)
Urobilinogen, Ur: 0.2 mg/dL (ref 0.2–1.0)
pH, UA: 5 (ref 5.0–7.5)

## 2020-03-05 DIAGNOSIS — I10 Essential (primary) hypertension: Secondary | ICD-10-CM | POA: Diagnosis not present

## 2020-03-07 ENCOUNTER — Encounter: Payer: Self-pay | Admitting: Cardiovascular Disease

## 2020-03-07 DIAGNOSIS — I5032 Chronic diastolic (congestive) heart failure: Secondary | ICD-10-CM

## 2020-03-07 HISTORY — DX: Chronic diastolic (congestive) heart failure: I50.32

## 2020-03-08 ENCOUNTER — Telehealth (HOSPITAL_COMMUNITY): Payer: Self-pay

## 2020-03-08 NOTE — Telephone Encounter (Signed)
Encounter complete. 

## 2020-03-14 ENCOUNTER — Ambulatory Visit (HOSPITAL_BASED_OUTPATIENT_CLINIC_OR_DEPARTMENT_OTHER)
Admission: RE | Admit: 2020-03-14 | Discharge: 2020-03-14 | Disposition: A | Discharge status: Home / Self Care | Payer: Medicare PPO | Source: Ambulatory Visit | Attending: Cardiovascular Disease | Admitting: Cardiovascular Disease

## 2020-03-14 ENCOUNTER — Other Ambulatory Visit: Payer: Self-pay

## 2020-03-14 ENCOUNTER — Ambulatory Visit (HOSPITAL_COMMUNITY)
Admission: RE | Admit: 2020-03-14 | Discharge: 2020-03-14 | Disposition: A | Discharge status: Home / Self Care | Payer: Medicare PPO | Source: Ambulatory Visit | Attending: Cardiology | Admitting: Cardiology

## 2020-03-14 ENCOUNTER — Encounter (HOSPITAL_COMMUNITY): Payer: Medicare PPO

## 2020-03-14 DIAGNOSIS — I10 Essential (primary) hypertension: Secondary | ICD-10-CM | POA: Diagnosis not present

## 2020-03-14 MED ORDER — TECHNETIUM TC 99M PYROPHOSPHATE
21.5000 | Freq: Once | INTRAVENOUS | Status: AC
  Administered 2020-03-14: 21.5 via INTRAVENOUS

## 2020-03-15 ENCOUNTER — Ambulatory Visit (HOSPITAL_COMMUNITY): Payer: Medicare PPO | Attending: Cardiovascular Disease

## 2020-03-15 ENCOUNTER — Other Ambulatory Visit: Payer: Self-pay

## 2020-03-15 DIAGNOSIS — I5032 Chronic diastolic (congestive) heart failure: Secondary | ICD-10-CM | POA: Insufficient documentation

## 2020-03-30 ENCOUNTER — Other Ambulatory Visit: Payer: Self-pay

## 2020-03-30 ENCOUNTER — Ambulatory Visit (INDEPENDENT_AMBULATORY_CARE_PROVIDER_SITE_OTHER): Payer: Medicare PPO | Admitting: Pharmacist Clinician (PhC)/ Clinical Pharmacy Specialist

## 2020-03-30 DIAGNOSIS — I10 Essential (primary) hypertension: Secondary | ICD-10-CM | POA: Diagnosis not present

## 2020-03-30 MED ORDER — DOXAZOSIN MESYLATE 8 MG PO TABS: 8.0000 mg | ORAL_TABLET | Freq: Every day | ORAL | 3 refills | Status: DC

## 2020-03-30 NOTE — Patient Instructions (Addendum)
Return for a a follow up appointment July 9 at 1:30   Check your blood pressure at home daily and keep record of the readings.  Take your BP meds as follows:  Take 4 mg of doxazosin tonight (June 4) then increase dose to 8 mg daily and take at night.      AM:  Valsartan hct 302/25 mg, hydralazine 100 mg  PM: amlodipine 10 mg, doxazsosin 8 mg, hydralazine 100 mg, metoprolol 25 mg   Bring all of your meds, your BP cuff and your record of home blood pressures to your next appointment.  Exercise as you're able, try to walk approximately 30 minutes per day.  Keep salt intake to a minimum, especially watch canned and prepared boxed foods.  Eat more fresh fruits and vegetables and fewer canned items.  Avoid eating in fast food restaurants.    HOW TO TAKE YOUR BLOOD PRESSURE: . Rest 5 minutes before taking your blood pressure. .  Don't smoke or drink caffeinated beverages for at least 30 minutes before. . Take your blood pressure before (not after) you eat. . Sit comfortably with your back supported and both feet on the floor (don't cross your legs). . Elevate your arm to heart level on a table or a desk. . Use the proper sized cuff. It should fit smoothly and snugly around your bare upper arm. There should be enough room to slip a fingertip under the cuff. The bottom edge of the cuff should be 1 inch above the crease of the elbow. . Ideally, take 3 measurements at one sitting and record the average.

## 2020-03-30 NOTE — Progress Notes (Signed)
04/01/2020 Jeff Watkins 05/02/1936 [id number]   HPI:  Jeff Watkins is a 84 y.o. male patient of Dr Oval Linsey, with a PMH below who presents today for advanced hypertension clinic follow-up.  He was seen by Dr. Oval Linsey at the end of April and his pressure was good at 132/58.  He had been referred because of elevated home BP readings during an insurance home health visit and then again at the drugstore it was elevated at 163/53.  He also recently had carpal tunnel surgery on both arms, and his wife notes his tremor appears to be worsening.  He was recently tested for amyloidosis and renal artery stenosis, both of which were negative.      Today patient reports feeling well overall.  His wife is with him today and helps to answer questions regarding family history.  He has not had any problems with the Vivify monitor, his was mailed to him as he does not have a smart phone.  He understands that he will need to mail it back at the end of the 16 week program.    Past Medical History: hyperlipidemia 02/2019;  TC 116, TG 107, HLD 41, LDL 53 - on atorvastatin 40  Chronic diastolic HF Grade 2 diastolic dysfunction  DM2 02/2019:  A1c 6.0 (down form 6.6) - controlled with diet/lifestyle  GERD On daily protonix  OSA      Blood Pressure Goal:  130/80  Current Medications: amlodipine 10 mg (pm), valsartan hct, 320/25 mg (am), doxazosin 4 mg (am), hydralazine 100 mg bid, metoprolol succ 25 mg (hs)  Family Hx: hypertension in father, had stroke in his 28's, died at 29; mother died in late 57's due to accident; 3 brothers - 1 with heart issues (Agent Orange exposure), thinks another has some heart disease; 1 living son, no heart issues  Social Hx: no tobacco, no alcohol; 16 oz black coffee daily, occasional soda, not often with caffeine  Diet: eats out mostly, enjoys salad; only salts tomatoes and eggs; good mix of fruits and vegetables; mix of proteins, [employer] each Tuesday; gave up fat back  recently  Exercise: no regular exercise other than housework; [place] has exercise room and pool with water aerobics  Home BP readings: last 10 home readings average 509/32 with systolic range from 671-245.  He is not having any problems with the Vivify cuff, and is thinking of purchasing a cuff for himself once the project time is over and he has to return the current cuff.    Intolerances: penicillin, sulfa  Labs: 1/21:  Na 136, K 4.7, Glu 140, BUN 14, SCr 0.99  Wt Readings from Last 3 Encounters:  03/30/20 226 lb 6.4 oz (102.7 kg)  03/14/20 238 lb (108 kg)  02/24/20 238 lb (108 kg)   BP Readings from Last 3 Encounters:  03/30/20 (!) 130/52  02/24/20 (!) 132/58  04/12/19 (!) 141/86   Pulse Readings from Last 3 Encounters:  03/30/20 (!) 50  02/24/20 66  04/12/19 (!) 51    Current Outpatient Medications  Medication Sig Dispense Refill  . albuterol (PROVENTIL HFA;VENTOLIN HFA) 108 (90 BASE) MCG/ACT inhaler Inhale 1-2 puffs into the lungs every 6 (six) hours as needed for wheezing or shortness of breath. Reported on 05/08/2016    . amLODipine (NORVASC) 10 MG tablet Take 10 mg by mouth at bedtime.     [name] atorvastatin (LIPITOR) 40 MG tablet Take 40 mg by mouth at bedtime.     [name]  clopidogrel (PLAVIX) 75 MG tablet Take 75 mg by mouth daily with breakfast.    . ferrous sulfate 325 (65 FE) MG tablet Take 325 mg by mouth daily with breakfast.    . Fish Oil-Cholecalciferol (FISH OIL + D3 PO) Take 1 capsule by mouth every morning.     . hydrALAZINE (APRESOLINE) 100 MG tablet Take 1 tablet (100 mg total) by mouth 2 (two) times daily. 180 tablet 1  . ibuprofen (ADVIL) 200 MG tablet Take 400 mg by mouth every 6 (six) hours as needed for moderate pain. Reported on 05/08/2016    . loratadine (CLARITIN) 10 MG tablet Take 10 mg by mouth every morning.     . metoprolol succinate (TOPROL-XL) 25 MG 24 hr tablet TAKE ONE (1) TABLET BY MOUTH EACH DAY 90 tablet 1  . Multiple Vitamin (MULTI  VITAMIN MENS PO) Take 1 tablet by mouth every evening.     . pantoprazole (PROTONIX) 20 MG tablet TAKE ONE (1) TABLET EACH DAY 30 tablet 0  . Polyethyl Glycol-Propyl Glycol (SYSTANE OP) Place 1 drop into both eyes at bedtime.     . valsartan-hydrochlorothiazide (DIOVAN-HCT) 320-25 MG tablet Take 1 tablet by mouth daily. 30 tablet 6  . doxazosin (CARDURA) 8 MG tablet Take 1 tablet (8 mg total) by mouth daily. 90 tablet 3   No current facility-administered medications for this visit.    Allergies  Allergen Reactions  . Penicillins Rash  . Sulfur Hives    In genital area accompanied by blisters    Past Medical History:  Diagnosis Date  . Arthritis   . Asthma   . Bronchitis   . Carpal tunnel syndrome, bilateral   . Chronic diastolic heart failure (HCC) 03/07/2020  . Coronary artery disease    50-70% LAD by 2001 cath  . Essential hypertension 02/25/2016  . GERD (gastroesophageal reflux disease)   . Hypercholesteremia   . Hyperlipidemia 02/25/2016  . Hypertension   . Obesity (BMI 30-39.9) 02/25/2016  . Pre-diabetes   . Shortness of breath 02/25/2016  . Sleep apnea    does not use CPAP  . Stroke (HCC) ~2010   mini stroke     Blood pressure (!) 130/52, pulse (!) 50, height 5\' 8"  (1.727 m), weight 226 lb 6.4 oz (102.7 kg).  Essential hypertension Patient with essential hypertension, still not at systolic goal.  He has been taking the doxazosin in the mornings without any orthostatic hypotension.  Will have him take 4 mg again tonight, then tomorrow increase dose to 8 mg daily and to be done at bedtime.  Explained reasoning and patient agreeable to change.  We will see him again in one month for follow up.     PharmD CPP [facility] [address] [place], [place] [phone]

## 2020-04-01 NOTE — Assessment & Plan Note (Signed)
Patient with essential hypertension, still not at systolic goal.  He has been taking the doxazosin in the mornings without any orthostatic hypotension.  Will have him take 4 mg again tonight, then tomorrow increase dose to 8 mg daily and to be done at bedtime.  Explained reasoning and patient agreeable to change.  We will see him again in one month for follow up.

## 2020-04-04 DIAGNOSIS — I10 Essential (primary) hypertension: Secondary | ICD-10-CM | POA: Diagnosis not present

## 2020-04-25 DIAGNOSIS — M4722 Other spondylosis with radiculopathy, cervical region: Secondary | ICD-10-CM | POA: Diagnosis not present

## 2020-04-25 DIAGNOSIS — E038 Other specified hypothyroidism: Secondary | ICD-10-CM | POA: Diagnosis not present

## 2020-04-25 DIAGNOSIS — I2510 Atherosclerotic heart disease of native coronary artery without angina pectoris: Secondary | ICD-10-CM | POA: Diagnosis not present

## 2020-04-25 DIAGNOSIS — D509 Iron deficiency anemia, unspecified: Secondary | ICD-10-CM | POA: Diagnosis not present

## 2020-04-25 DIAGNOSIS — J45991 Cough variant asthma: Secondary | ICD-10-CM | POA: Diagnosis not present

## 2020-04-25 DIAGNOSIS — J45901 Unspecified asthma with (acute) exacerbation: Secondary | ICD-10-CM | POA: Diagnosis not present

## 2020-04-25 DIAGNOSIS — E1122 Type 2 diabetes mellitus with diabetic chronic kidney disease: Secondary | ICD-10-CM | POA: Diagnosis not present

## 2020-04-25 DIAGNOSIS — I10 Essential (primary) hypertension: Secondary | ICD-10-CM | POA: Diagnosis not present

## 2020-04-27 DIAGNOSIS — J45991 Cough variant asthma: Secondary | ICD-10-CM | POA: Diagnosis not present

## 2020-04-27 DIAGNOSIS — E1122 Type 2 diabetes mellitus with diabetic chronic kidney disease: Secondary | ICD-10-CM | POA: Diagnosis not present

## 2020-04-27 DIAGNOSIS — G4733 Obstructive sleep apnea (adult) (pediatric): Secondary | ICD-10-CM | POA: Diagnosis not present

## 2020-04-27 DIAGNOSIS — Z1389 Encounter for screening for other disorder: Secondary | ICD-10-CM | POA: Diagnosis not present

## 2020-04-27 DIAGNOSIS — G2581 Restless legs syndrome: Secondary | ICD-10-CM | POA: Diagnosis not present

## 2020-04-27 DIAGNOSIS — D509 Iron deficiency anemia, unspecified: Secondary | ICD-10-CM | POA: Diagnosis not present

## 2020-04-27 DIAGNOSIS — M4722 Other spondylosis with radiculopathy, cervical region: Secondary | ICD-10-CM | POA: Diagnosis not present

## 2020-04-27 DIAGNOSIS — Z0000 Encounter for general adult medical examination without abnormal findings: Secondary | ICD-10-CM | POA: Diagnosis not present

## 2020-04-27 DIAGNOSIS — I2510 Atherosclerotic heart disease of native coronary artery without angina pectoris: Secondary | ICD-10-CM | POA: Diagnosis not present

## 2020-04-27 DIAGNOSIS — I10 Essential (primary) hypertension: Secondary | ICD-10-CM | POA: Diagnosis not present

## 2020-05-04 ENCOUNTER — Other Ambulatory Visit: Payer: Self-pay

## 2020-05-04 ENCOUNTER — Ambulatory Visit (INDEPENDENT_AMBULATORY_CARE_PROVIDER_SITE_OTHER): Payer: Medicare PPO | Admitting: Pharmacist

## 2020-05-04 ENCOUNTER — Encounter: Payer: Self-pay | Admitting: Pharmacist

## 2020-05-04 VITALS — BP 155/58 | HR 48

## 2020-05-04 DIAGNOSIS — I5032 Chronic diastolic (congestive) heart failure: Secondary | ICD-10-CM | POA: Diagnosis not present

## 2020-05-04 DIAGNOSIS — I10 Essential (primary) hypertension: Secondary | ICD-10-CM | POA: Diagnosis not present

## 2020-05-04 MED ORDER — SPIRONOLACTONE 25 MG PO TABS: 12.5000 mg | ORAL_TABLET | Freq: Every day | ORAL | 1 refills | Status: DC

## 2020-05-04 NOTE — Patient Instructions (Signed)
It was great meeting you today!  Your blood pressure goal is <130/80  We are going to start spironolactone 25 mg 1/2 tablet once a day in the morning  We will check your lab work 1 week after your start it  Continue your other blood pressure medications as prescribed  Laural Golden, PharmD, [place], CDCES [facility] [address] [place], [place] [address] Phone: [phone]; Fax: [phone] 05/04/2020 2:15 PM

## 2020-05-04 NOTE — Progress Notes (Signed)
Patient ID: Jeff Watkins                 DOB: December 20, 1935                      MRN: [id number]     HPI: Jeff Watkins is a 84 y.o. male referred by Dr. Duke Salvia to HTN clinic. PMH is significant for CHF, HLD, and obesity.  Is enrolled in HTN clinic.  Presents today for HTN visit #2.  Doxasozin was increased at last visit to 8 mg and patient reports he feels like he is doing well on it.  Has Vivify blood pressure cuff at home and is tracking himself as well.    Average blood pressure 144/71 with pulse rate of 52.  Blood pressure ranging from 122/52 to 156/75.  Pulse rate ranging from 46-57.    Is not having sleeping issues except for having to wake up to urinate.  Eats out a restaurants often so is not sure about salt intake but reports he rarely adds sodium to his food.    Current HTN meds: valsartan/HCTZ 320/25, hydralazine 100 mg BID, doxasozin 8 mg daily, amlodipine 10 mg daily, metoprolol succinate 25 mg daily  Previously tried: doxasozin 4 mg, hydralazine 25 mg/50 mg, hydralazine 25/50mg , losartan/HCTZ    Wt Readings from Last 3 Encounters:  03/30/20 226 lb 6.4 oz (102.7 kg)  03/14/20 238 lb (108 kg)  02/24/20 238 lb (108 kg)   BP Readings from Last 3 Encounters:  03/30/20 (!) 130/52  02/24/20 (!) 132/58  04/12/19 (!) 141/86   Pulse Readings from Last 3 Encounters:  03/30/20 (!) 50  02/24/20 66  04/12/19 (!) 51    Renal function: CrCl cannot be calculated (Patient's most recent lab result is older than the maximum 21 days allowed.).  Past Medical History:  Diagnosis Date  . Arthritis   . Asthma   . Bronchitis   . Carpal tunnel syndrome, bilateral   . Chronic diastolic heart failure (HCC) 03/07/2020  . Coronary artery disease    50-70% LAD by 2001 cath  . Essential hypertension 02/25/2016  . GERD (gastroesophageal reflux disease)   . Hypercholesteremia   . Hyperlipidemia 02/25/2016  . Hypertension   . Obesity (BMI 30-39.9) 02/25/2016  . Pre-diabetes   . Shortness of  breath 02/25/2016  . Sleep apnea    does not use CPAP  . Stroke [facility]) ~2010   mini stroke     Current Outpatient Medications on File Prior to Visit  Medication Sig Dispense Refill  . albuterol (PROVENTIL HFA;VENTOLIN HFA) 108 (90 BASE) MCG/ACT inhaler Inhale 1-2 puffs into the lungs every 6 (six) hours as needed for wheezing or shortness of breath. Reported on 05/08/2016    . amLODipine (NORVASC) 10 MG tablet Take 10 mg by mouth at bedtime.     [name] atorvastatin (LIPITOR) 40 MG tablet Take 40 mg by mouth at bedtime.     . clopidogrel (PLAVIX) 75 MG tablet Take 75 mg by mouth daily with breakfast.    . doxazosin (CARDURA) 8 MG tablet Take 1 tablet (8 mg total) by mouth daily. 90 tablet 3  . ferrous sulfate 325 (65 FE) MG tablet Take 325 mg by mouth daily with breakfast.    . Fish Oil-Cholecalciferol (FISH OIL + D3 PO) Take 1 capsule by mouth every morning.     . hydrALAZINE (APRESOLINE) 100 MG tablet Take 1 tablet (100 mg total) by mouth 2 (two) times  daily. 180 tablet 1  . ibuprofen (ADVIL) 200 MG tablet Take 400 mg by mouth every 6 (six) hours as needed for moderate pain. Reported on 05/08/2016    . loratadine (CLARITIN) 10 MG tablet Take 10 mg by mouth every morning.     . metoprolol succinate (TOPROL-XL) 25 MG 24 hr tablet TAKE ONE (1) TABLET BY MOUTH EACH DAY 90 tablet 1  . Multiple Vitamin (MULTI VITAMIN MENS PO) Take 1 tablet by mouth every evening.     . pantoprazole (PROTONIX) 20 MG tablet TAKE ONE (1) TABLET EACH DAY 30 tablet 0  . Polyethyl Glycol-Propyl Glycol (SYSTANE OP) Place 1 drop into both eyes at bedtime.     . valsartan-hydrochlorothiazide (DIOVAN-HCT) 320-25 MG tablet Take 1 tablet by mouth daily. 30 tablet 6   No current facility-administered medications on file prior to visit.    Allergies  Allergen Reactions  . Penicillins Rash  . Sulfur Hives    In genital area accompanied by blisters     Assessment/Plan:  1. Hypertension - Patient blood pressure 155/58 which is  above goal of <130/80.  Patient already on high dose CCB, ARB, thiazide, and hydralazine.  Not a great candidate for increasing beta blocker due to bradycardia despite CHF diagnosis.  Recommend patient begin low dose spironolactone since kidney function and electrolyte levels WNL and recheck BMP in 1 week after starting.  Patient to begin spironolactone 12.5 mg every morning and check BMP when he comes [place]. Recheck in 1 month for HTN visit #3.  Laural Golden, PharmD, BCACP, CDCES [facility] [address] [place], [place] [address] Phone: [phone]; Fax: [phone] 05/04/2020 3:09 PM

## 2020-05-16 ENCOUNTER — Other Ambulatory Visit: Payer: Self-pay

## 2020-05-16 DIAGNOSIS — I10 Essential (primary) hypertension: Secondary | ICD-10-CM

## 2020-05-17 LAB — BASIC METABOLIC PANEL
BUN/Creatinine Ratio: 19 (ref 10–24)
BUN: 18 mg/dL (ref 8–27)
CO2: 24 mmol/L (ref 20–29)
Calcium: 9.6 mg/dL (ref 8.6–10.2)
Chloride: 96 mmol/L (ref 96–106)
Creatinine, Ser: 0.93 mg/dL (ref 0.76–1.27)
GFR calc Af Amer: 88 mL/min/{1.73_m2} (ref >59)
GFR calc non Af Amer: 76 mL/min/{1.73_m2} (ref >59)
Glucose: 94 mg/dL (ref 65–99)
Potassium: 5.2 mmol/L (ref 3.5–5.2)
Sodium: 132 mmol/L — ABNORMAL LOW (ref 134–144)

## 2020-06-03 DIAGNOSIS — I10 Essential (primary) hypertension: Secondary | ICD-10-CM | POA: Diagnosis not present

## 2020-06-11 DIAGNOSIS — E1122 Type 2 diabetes mellitus with diabetic chronic kidney disease: Secondary | ICD-10-CM | POA: Diagnosis not present

## 2020-06-11 DIAGNOSIS — M4722 Other spondylosis with radiculopathy, cervical region: Secondary | ICD-10-CM | POA: Diagnosis not present

## 2020-06-11 DIAGNOSIS — D509 Iron deficiency anemia, unspecified: Secondary | ICD-10-CM | POA: Diagnosis not present

## 2020-06-11 DIAGNOSIS — K219 Gastro-esophageal reflux disease without esophagitis: Secondary | ICD-10-CM | POA: Diagnosis not present

## 2020-06-11 DIAGNOSIS — I2510 Atherosclerotic heart disease of native coronary artery without angina pectoris: Secondary | ICD-10-CM | POA: Diagnosis not present

## 2020-06-11 DIAGNOSIS — E038 Other specified hypothyroidism: Secondary | ICD-10-CM | POA: Diagnosis not present

## 2020-06-11 DIAGNOSIS — J45901 Unspecified asthma with (acute) exacerbation: Secondary | ICD-10-CM | POA: Diagnosis not present

## 2020-06-11 DIAGNOSIS — I10 Essential (primary) hypertension: Secondary | ICD-10-CM | POA: Diagnosis not present

## 2020-06-11 DIAGNOSIS — J45991 Cough variant asthma: Secondary | ICD-10-CM | POA: Diagnosis not present

## 2020-06-12 ENCOUNTER — Telehealth: Payer: Self-pay

## 2020-06-12 DIAGNOSIS — Z0000 Encounter for general adult medical examination without abnormal findings: Secondary | ICD-10-CM
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
## 2020-06-13 ENCOUNTER — Ambulatory Visit: Payer: Medicare PPO

## 2020-06-20 ENCOUNTER — Encounter: Payer: Self-pay | Admitting: Cardiovascular Disease

## 2020-06-20 ENCOUNTER — Ambulatory Visit: Payer: Medicare PPO | Admitting: Cardiovascular Disease

## 2020-06-20 ENCOUNTER — Other Ambulatory Visit: Payer: Self-pay

## 2020-06-20 VITALS — BP 140/52 | HR 57 | Ht 68.0 in | Wt 224.6 lb

## 2020-06-20 DIAGNOSIS — I5032 Chronic diastolic (congestive) heart failure: Secondary | ICD-10-CM | POA: Diagnosis not present

## 2020-06-20 DIAGNOSIS — E785 Hyperlipidemia, unspecified: Secondary | ICD-10-CM | POA: Diagnosis not present

## 2020-06-20 DIAGNOSIS — I10 Essential (primary) hypertension: Secondary | ICD-10-CM

## 2020-06-20 MED ORDER — CARVEDILOL 6.25 MG PO TABS: 6.2500 mg | ORAL_TABLET | Freq: Two times a day (BID) | ORAL | 3 refills | Status: DC

## 2020-06-20 MED ORDER — METOPROLOL SUCCINATE ER 25 MG PO TB24: 25.0000 mg | ORAL_TABLET | Freq: Every day | ORAL | 0 refills | Status: DC

## 2020-06-20 NOTE — Patient Instructions (Addendum)
Medication Instructions:  WHEN YOU FINISH METOPROLOL START CARVEDILOL 6.25 MG TWICE A DAY   *If you need a refill on your cardiac medications before your next appointment, please call your pharmacy*  Lab Work: NONE   Testing/Procedures: NONE   Follow-Up: At [facility], you and your health needs are our priority.  As part of our continuing mission to provide you with exceptional heart care, we have created designated Provider Care Teams.  These Care Teams include your primary Cardiologist (physician) and Advanced Practice Providers (APPs -  Physician Assistants and Nurse Practitioners) who all work together to provide you with the care you need, when you need it.  We recommend signing up for the patient portal called "MyChart".  Sign up information is provided on this After Visit Summary.  MyChart is used to connect with patients for Virtual Visits (Telemedicine).  Patients are able to view lab/test results, encounter notes, upcoming appointments, etc.  Non-urgent messages can be sent to your provider as well.   To learn more about what you can do with MyChart, go to [email or web address].    Your next appointment:   3-4 month(s)  The format for your next appointment:   In Person  Provider:   You may see [name], MD or one of the following Advanced Practice Providers on your designated Care Team:    Corine Shelter, PA-C  [place], [place]  Edd Fabian, [place]  Other Instructions  [facility] WILL CONTACT YOU ABOUT A PICK UP FOR THE HOME BLOOD PRESSURE MONITOR

## 2020-06-20 NOTE — Progress Notes (Signed)
h   Cardiology Office Note  Date:  02/24/20  ID:  Jeff Watkins, DOB January 11, 1936, MRN [id number]  PCP:  Lorenda Ishihara, MD  Cardiologist:   [name], MD   No chief complaint on file.  History of Present Illness: Jeff Watkins is a 84 y.o. male with moderate CAD, hypertension, hyperlipidemia, OSA, GERD and diabetes who presents for follow up.  Jeff Watkins was first seen on 5/1 with a complaint of dyspnea on exertion.  He was referred for an echo that showed LVEF 55-60% and grade 2 diastolic dysfunction.  He also had an exercise Myoview that was negative for ischemia but did reveal frequent PVCs.  He exercised for 6 minutes on the Bruce protocol (5.4 METS). At that appointment his losartan/HCTZ was increased due to poorly-controlled hypertension. Hydralazine was then increased to 50mg  bid.  Jeff Watkins had PVCs and metoprolol was increased.  Jeff Watkins had a home health visit through his insurance.  He was noted to have some mild edema and his BP was elevated.  He also had carpal tunnel surgery.  He has had carpal tunnel in both arms.  At his last appointment there was concern for cardiac amyloidosis.  He was referred for an ATT amyloid scan that was negative.  Hydralazine was also increased due to poorly controlled hypertension.  Due to his exertional dyspnea his echo was repeated.  His echo 02/2020 revealed LVEF 60 to 65% with grade 2 diastolic dysfunction.  Lately he has been feeling well.  He has no chest pain.  His breathing has been stable.  He continues to have some dyspnea with things like going up a flight of steps.  He has no lower extremity edema, orthopnea, or PND.  He is not getting much formal exercise but has been very active around the house.  He has been checking his blood pressure in the remote home monitoring system.  His blood pressure has been averaging 124/62 for the last 13 days.  There have been numbers as high as 109 and is high as 176.  He usually checks his blood pressure  at 9 AM before taking his morning medication.    Past Medical History:  Diagnosis Date  . Arthritis   . Asthma   . Bronchitis   . Carpal tunnel syndrome, bilateral   . Chronic diastolic heart failure (HCC) 03/07/2020  . Coronary artery disease    50-70% LAD by 2001 cath  . Essential hypertension 02/25/2016  . GERD (gastroesophageal reflux disease)   . Hypercholesteremia   . Hyperlipidemia 02/25/2016  . Hypertension   . Obesity (BMI 30-39.9) 02/25/2016  . Pre-diabetes   . Shortness of breath 02/25/2016  . Sleep apnea    does not use CPAP  . Stroke [facility]) ~2010   mini stroke     Past Surgical History:  Procedure Laterality Date  . CARDIAC CATHETERIZATION  2001  . CARPAL TUNNEL RELEASE Right 07/06/2018   Procedure: RIGHT CARPAL TUNNEL RELEASE, EXTENDED EXCISION;  Surgeon: 09/05/2018, MD;  Location: [facility];  Service: Orthopedics;  Laterality: Right;  . CARPAL TUNNEL RELEASE Left 04/12/2019   Procedure: CARPAL TUNNEL RELEASE;  Surgeon: 04/14/2019, MD;  Location: [facility];  Service: Orthopedics;  Laterality: Left;  . COLONOSCOPY WITH PROPOFOL N/A 05/21/2015   Procedure: COLONOSCOPY WITH PROPOFOL;  Surgeon: 05/23/2015, MD;  Location: [facility];  Service: Endoscopy;  Laterality: N/A;  . EYE SURGERY     bilateral cataracts  .  FRACTURE SURGERY  1966   compound arm  . HERNIA REPAIR    . LAPAROSCOPIC APPENDECTOMY N/A 10/18/2013   Procedure: APPENDECTOMY LAPAROSCOPIC;  Surgeon: [name], MD;  Location: [facility] OR;  Service: General;  Laterality: N/A;  . SUBMANDIBULAR GLAND EXCISION Left 09/02/2017    Left submandibular gland and associated soft tissue and lymph nodes  . SUBMANDIBULAR GLAND EXCISION Left 09/02/2017   Procedure: EXCISION LEFT SUBMANDIBULAR GLAND;  Surgeon: Serena Colonel, MD;  Location: [facility] OR;  Service: ENT;  Laterality: Left;  . TESTICLE REMOVAL  1965     Current Outpatient Medications  Medication Sig Dispense Refill  . albuterol  (PROVENTIL HFA;VENTOLIN HFA) 108 (90 BASE) MCG/ACT inhaler Inhale 1-2 puffs into the lungs every 6 (six) hours as needed for wheezing or shortness of breath. Reported on 05/08/2016    . amLODipine (NORVASC) 10 MG tablet Take 10 mg by mouth at bedtime.     [name] atorvastatin (LIPITOR) 40 MG tablet Take 40 mg by mouth at bedtime.     . clopidogrel (PLAVIX) 75 MG tablet Take 75 mg by mouth daily with breakfast.    . doxazosin (CARDURA) 8 MG tablet Take 1 tablet (8 mg total) by mouth daily. 90 tablet 3  . ferrous sulfate 325 (65 FE) MG tablet Take 325 mg by mouth daily with breakfast.    . Fish Oil-Cholecalciferol (FISH OIL + D3 PO) Take 1 capsule by mouth every morning.     . hydrALAZINE (APRESOLINE) 100 MG tablet Take 1 tablet (100 mg total) by mouth 2 (two) times daily. 180 tablet 1  . ibuprofen (ADVIL) 200 MG tablet Take 400 mg by mouth every 6 (six) hours as needed for moderate pain. Reported on 05/08/2016    . loratadine (CLARITIN) 10 MG tablet Take 10 mg by mouth every morning.     . metoprolol succinate (TOPROL-XL) 25 MG 24 hr tablet Take 1 tablet (25 mg total) by mouth daily. 30 tablet 0  . Multiple Vitamin (MULTI VITAMIN MENS PO) Take 1 tablet by mouth every evening.     . pantoprazole (PROTONIX) 20 MG tablet TAKE ONE (1) TABLET EACH DAY 30 tablet 0  . Polyethyl Glycol-Propyl Glycol (SYSTANE OP) Place 1 drop into both eyes at bedtime.     [name] spironolactone (ALDACTONE) 25 MG tablet Take 0.5 tablets (12.5 mg total) by mouth daily. 15 tablet 1  . valsartan-hydrochlorothiazide (DIOVAN-HCT) 320-25 MG tablet Take 1 tablet by mouth daily. 30 tablet 6  . [START ON 07/21/2020] carvedilol (COREG) 6.25 MG tablet Take 1 tablet (6.25 mg total) by mouth 2 (two) times daily. 180 tablet 3   No current facility-administered medications for this visit.    Allergies:   Penicillins and Sulfur    Social History:  The patient  reports that he quit smoking about 59 years ago. He has a 0.13 pack-year smoking history.  He has never used smokeless tobacco. He reports that he does not drink alcohol and does not use drugs.   Family History:  The patient's family history includes Cancer in his brother; HIV/AIDS (age of onset: 58) in his daughter; Heart disease in his father; Other in his son; Stroke in his father.    ROS:  Please see the history of present illness.   Otherwise, review of systems are positive for none.   All other systems are reviewed and negative.    PHYSICAL EXAM: VS:  BP (!) 140/52   Pulse (!) 57   Ht 5\' 8"  (1.727  m)   Wt 224 lb 9.6 oz (101.9 kg)   SpO2 95%   BMI 34.15 kg/m  , BMI Body mass index is 34.15 kg/m. GENERAL:  Well appearing HEENT: Pupils equal round and reactive, fundi not visualized, oral mucosa unremarkable NECK:  No jugular venous distention, waveform within normal limits, carotid upstroke brisk and symmetric, no bruits LUNGS:  Clear to auscultation bilaterally HEART:  RRR.  PMI not displaced or sustained,S1 and S2 within normal limits, no S3, no S4, no clicks, no rubs, no murmurs ABD:  Flat, positive bowel sounds normal in frequency in pitch, no bruits, no rebound, no guarding, no midline pulsatile mass, no hepatomegaly, no splenomegaly EXT:  2 plus pulses throughout, no edema, no cyanosis no clubbing SKIN:  No rashes no nodules NEURO:  Cranial nerves II through XII grossly intact, motor grossly intact throughout PSYCH:  Cognitively intact, oriented to person place and time   EKG:  EKG is not ordered today. The ekg ordered 02/25/16 demonstrates sinus rhythm. Rate 63 bpm. Nonspecific T wave flattening. 06/24/18: Sinus bradycardia.  Rate 59 bpm.   02/24/20: Sinus rhythm.  Rate 66 bpm.   Echo 03/19/16: Study Conclusions  - Left ventricle: The cavity size was normal. There was  mild-moderate concentric hypertrophy. Systolic function was  normal. The estimated ejection fraction was in the range of 55%  to 60%. Wall motion was normal; there were no regional wall   motion abnormalities. Features are consistent with a pseudonormal  left ventricular filling pattern, with concomitant abnormal  relaxation and increased filling pressure (grade 2 diastolic  dysfunction). Doppler parameters are consistent with  indeterminate ventricular filling pressure. - Aortic valve: Transvalvular velocity was within the normal range.  There was no stenosis. There was no regurgitation. Valve area  (VTI): 3.1 cm^2. Valve area (Vmax): 3.11 cm^2. Valve area  (Vmean): 3.04 cm^2. - Mitral valve: Transvalvular velocity was within the normal range.  There was no evidence for stenosis. There was no regurgitation. - Left atrium: The appendage was moderately dilated. - Right ventricle: The cavity size was mildly dilated. Wall  thickness was normal. Systolic function was normal. - Tricuspid valve: There was mild regurgitation. - Pulmonary arteries: Systolic pressure was within the normal  range. PA peak pressure: 35 mm Hg (S). - Inferior vena cava: The vessel was normal in size. The  respirophasic diameter changes were in the normal range (= 50%),  consistent with normal central venous pressure.  Exercise Myoview 03/19/16:   Nuclear stress EF: 59%. No wall motion abnormality  The left ventricular ejection fraction is normal (55-65%).  There was no ST segment deviation noted during stress. There were frequent PVCs during exercise, rare couplet  This is a low risk perfusion study, no ischemia identified. Question if frequent PVCs are contributing to dyspnea on exertion. Fair exercise effort of 6 minutes and 13 seconds.   Recent Labs: 05/16/2020: BUN 18; Creatinine, Ser 0.93; Potassium 5.2; Sodium 132   02/16/2018: Total cholesterol 135, triglycerides 185, HDL 42, LDL 55 Potassium 4.3, creatinine 1.03 ALT 9.0   Lipid Panel    Component Value Date/Time   CHOL  10/17/2007 0540    118        ATP III CLASSIFICATION:  <200     mg/dL   Desirable  867-619   mg/dL   Borderline High  >=509    mg/dL   High   TRIG 326 71/24/5809 0540   HDL 28 (L) 10/17/2007 0540   CHOLHDL 4.2 10/17/2007 0540  VLDL 20 10/17/2007 0540   LDLCALC  10/17/2007 0540    70        Total Cholesterol/HDL:CHD Risk Coronary Heart Disease Risk Table                     Men   Women  1/2 Average Risk   3.4   3.3      Wt Readings from Last 3 Encounters:  06/20/20 224 lb 9.6 oz (101.9 kg)  03/30/20 226 lb 6.4 oz (102.7 kg)  03/14/20 238 lb (108 kg)      ASSESSMENT AND PLAN:  # Chronic diastolic heart failure: # Shortness of breath:  Jeff Watkins has chronic LE edema.  Grade 2 diastolic function on echo.  PYP was negative for amyloidosis.  # Hypertension: Blood pressure average is at goal today.  However he is frustrated by the wide swings in his blood pressure.  We will switch the metoprolol to carvedilol 6.25 mg twice daily.  Hopefully adding this evening dose of medicine will also help make his blood pressure to be more at goal before his morning meds.  He does have many metoprolol tablets left at home.  He will finish this up prior to switching to carvedilol.  He will stop tracking his blood pressure in the [employer] remote patient monitoring system and return the device.  He will continue to write these numbers down.  Continue amlodipine, doxazosin, hydralazine, spironolactone, and losartan/HCTZ.  # PVCs: Controlled on metoprolol.  # Hyperlipidemia:  Continue atorvastatin.  LDL was 52 on 04/2020.  # Obesity: Jeff Watkins was encouraged to increase his physical activity to at least 30-40 minutes most days of the week.     Current medicines are reviewed at length with the patient today.  The patient does not have concerns regarding medicines.  The following changes have been made: Increase hydralazine.  Labs/ tests ordered today include:   No orders of the defined types were placed in this encounter.    Disposition:   FU with Patti Shorb C. Duke Salviaandolph, MD, [facility] in 3-4  months.   Signed, Daylah Sayavong C. Duke Salviaandolph, MD, [facility]  06/20/2020 5:37 PM    [facility]

## 2020-07-12 DIAGNOSIS — I10 Essential (primary) hypertension: Secondary | ICD-10-CM | POA: Diagnosis not present

## 2020-07-12 DIAGNOSIS — J45991 Cough variant asthma: Secondary | ICD-10-CM | POA: Diagnosis not present

## 2020-07-12 DIAGNOSIS — K219 Gastro-esophageal reflux disease without esophagitis: Secondary | ICD-10-CM | POA: Diagnosis not present

## 2020-07-12 DIAGNOSIS — M4722 Other spondylosis with radiculopathy, cervical region: Secondary | ICD-10-CM | POA: Diagnosis not present

## 2020-07-12 DIAGNOSIS — E1122 Type 2 diabetes mellitus with diabetic chronic kidney disease: Secondary | ICD-10-CM | POA: Diagnosis not present

## 2020-07-12 DIAGNOSIS — J45901 Unspecified asthma with (acute) exacerbation: Secondary | ICD-10-CM | POA: Diagnosis not present

## 2020-07-12 DIAGNOSIS — E038 Other specified hypothyroidism: Secondary | ICD-10-CM | POA: Diagnosis not present

## 2020-07-12 DIAGNOSIS — I2510 Atherosclerotic heart disease of native coronary artery without angina pectoris: Secondary | ICD-10-CM | POA: Diagnosis not present

## 2020-07-12 DIAGNOSIS — D509 Iron deficiency anemia, unspecified: Secondary | ICD-10-CM | POA: Diagnosis not present

## 2020-07-31 ENCOUNTER — Ambulatory Visit: Payer: Medicare PPO | Attending: Internal Medicine

## 2020-07-31 DIAGNOSIS — Z23 Encounter for immunization: Secondary | ICD-10-CM

## 2020-07-31 NOTE — Progress Notes (Signed)
   Covid-19 Vaccination Clinic  Name:  Jeff Watkins    MRN: [id number] DOB: 06-07-1936  07/31/2020  Jeff Watkins was observed post Covid-19 immunization for 15 minutes without incident. He was provided with Vaccine Information Sheet and instruction to access the V-Safe system.   Jeff Watkins was instructed to call 911 with any severe reactions post vaccine: [name] Difficulty breathing  . Swelling of face and throat  . A fast heartbeat  . A bad rash all over body  . Dizziness and weakness

## 2020-07-31 NOTE — Addendum Note (Signed)
Addended by: Berenice Bouton on: 07/31/2020 02:20 PM   Modules accepted: Orders

## 2020-09-10 ENCOUNTER — Other Ambulatory Visit: Payer: Self-pay | Admitting: Cardiovascular Disease

## 2020-09-10 NOTE — Telephone Encounter (Signed)
Rx has been sent to the pharmacy electronically. ° °

## 2020-09-11 DIAGNOSIS — E038 Other specified hypothyroidism: Secondary | ICD-10-CM | POA: Diagnosis not present

## 2020-09-11 DIAGNOSIS — I2510 Atherosclerotic heart disease of native coronary artery without angina pectoris: Secondary | ICD-10-CM | POA: Diagnosis not present

## 2020-09-11 DIAGNOSIS — J45991 Cough variant asthma: Secondary | ICD-10-CM | POA: Diagnosis not present

## 2020-09-11 DIAGNOSIS — J45901 Unspecified asthma with (acute) exacerbation: Secondary | ICD-10-CM | POA: Diagnosis not present

## 2020-09-11 DIAGNOSIS — M4722 Other spondylosis with radiculopathy, cervical region: Secondary | ICD-10-CM | POA: Diagnosis not present

## 2020-09-11 DIAGNOSIS — E1122 Type 2 diabetes mellitus with diabetic chronic kidney disease: Secondary | ICD-10-CM | POA: Diagnosis not present

## 2020-09-11 DIAGNOSIS — I10 Essential (primary) hypertension: Secondary | ICD-10-CM | POA: Diagnosis not present

## 2020-09-11 DIAGNOSIS — D509 Iron deficiency anemia, unspecified: Secondary | ICD-10-CM | POA: Diagnosis not present

## 2020-09-11 DIAGNOSIS — K219 Gastro-esophageal reflux disease without esophagitis: Secondary | ICD-10-CM | POA: Diagnosis not present

## 2020-09-26 ENCOUNTER — Ambulatory Visit: Payer: Medicare PPO | Admitting: Cardiovascular Disease

## 2020-09-26 ENCOUNTER — Encounter: Payer: Self-pay | Admitting: Cardiovascular Disease

## 2020-09-26 ENCOUNTER — Other Ambulatory Visit: Payer: Self-pay

## 2020-09-26 VITALS — BP 140/52 | HR 66 | Ht 68.0 in | Wt 225.0 lb

## 2020-09-26 DIAGNOSIS — I10 Essential (primary) hypertension: Secondary | ICD-10-CM

## 2020-09-26 DIAGNOSIS — R0602 Shortness of breath: Secondary | ICD-10-CM | POA: Diagnosis not present

## 2020-09-26 DIAGNOSIS — R5383 Other fatigue: Secondary | ICD-10-CM | POA: Diagnosis not present

## 2020-09-26 DIAGNOSIS — E669 Obesity, unspecified: Secondary | ICD-10-CM | POA: Diagnosis not present

## 2020-09-26 DIAGNOSIS — E782 Mixed hyperlipidemia: Secondary | ICD-10-CM

## 2020-09-26 DIAGNOSIS — I5032 Chronic diastolic (congestive) heart failure: Secondary | ICD-10-CM

## 2020-09-26 NOTE — Patient Instructions (Addendum)
Medication Instructions:  Your physician recommends that you continue on your current medications as directed. Please refer to the Current Medication list given to you today.  *If you need a refill on your cardiac medications before your next appointment, please call your pharmacy*   Lab Work: TSH/BNP TODAY   If you have labs (blood work) drawn today and your tests are completely normal, you will receive your results only by: [name] MyChart Message (if you have MyChart) OR . A paper copy in the mail If you have any lab test that is abnormal or we need to change your treatment, we will call you to review the results.  Testing/Procedures: NONE   Follow-Up: At [facility], you and your health needs are our priority.  As part of our continuing mission to provide you with exceptional heart care, we have created designated Provider Care Teams.  These Care Teams include your primary Cardiologist (physician) and Advanced Practice Providers (APPs -  Physician Assistants and Nurse Practitioners) who all work together to provide you with the care you need, when you need it.  We recommend signing up for the patient portal called "MyChart".  Sign up information is provided on this After Visit Summary.  MyChart is used to connect with patients for Virtual Visits (Telemedicine).  Patients are able to view lab/test results, encounter notes, upcoming appointments, etc.  Non-urgent messages can be sent to your provider as well.   To learn more about what you can do with MyChart, go to [email or web address].    Your next appointment:   6 month(s)  The format for your next appointment:   In Person  Provider:   You may see [name], MD or one of the following Advanced Practice Providers on your designated Care Team:    Corine Shelter, PA-C  [place], [place]  Edd Fabian, [place]  Other Instructions  YOU NEED TO WALK 10 MINUTES EACH DAY

## 2020-09-26 NOTE — Progress Notes (Signed)
h   Cardiology Office Note  Date:  02/24/20  ID:  Jeff Watkins, DOB [birth year], MRN [id number]  PCP:  Lorenda Ishihara, MD  Cardiologist:   [name], MD   No chief complaint on file.  History of Present Illness: Jeff Watkins is a 84 y.o. male with moderate CAD, hypertension, hyperlipidemia, OSA, GERD and diabetes who presents for follow up.  Jeff Watkins was first seen on 5/1 with a complaint of dyspnea on exertion.  He was referred for an echo that showed LVEF 55-60% and grade 2 diastolic dysfunction.  He also had an exercise Myoview that was negative for ischemia but did reveal frequent PVCs.  He exercised for 6 minutes on the Bruce protocol (5.4 METS). At that appointment his losartan/HCTZ was increased due to poorly-controlled hypertension. Hydralazine was then increased to 50mg  bid.  Jeff Watkins had PVCs and metoprolol was increased.  Jeff Watkins had a home health visit through his insurance.  He was noted to have some mild edema and his BP was elevated.  He also had carpal tunnel surgery.  He has had carpal tunnel in both arms.  At his last appointment there was concern for cardiac amyloidosis.  He was referred for an ATT amyloid scan that was negative.  Hydralazine was also increased due to poorly controlled hypertension.  Due to his exertional dyspnea his echo was repeated.  His echo 02/2020 revealed LVEF 60 to 65% with grade 2 diastolic dysfunction.     At his last appointment Mr. Jeff Watkins's BP was averaging 124/62 but labile.  He was switched from metoprolol to carvedilol.  He notes that he is cold all the time.  In the past he used to be hot.  He also gets tired and short of breath with minimal exertion.  His wife notes that he wasn't taking his testosterone as prescribed.  He was using androgel but has difficulty getting it through his insurance.  He has exertional dyspnea.  He denies any chest pain or pressure.  He has no lower extremity edema, orthopnea, or PND.  He also notes that his  mucus is thick but clear.  He denies any GERD.  He does not check his blood pressure at home.  He no longer has a machine and is unable to afford anyone.  He mostly eats out regularly tries to eat healthy.  He notes that his sodium intake may be high.  He has not been checking his blood pressure at home often.  He no longer has blood pressure machine and is unable to afford one.   Past Medical History:  Diagnosis Date  . Arthritis   . Asthma   . Bronchitis   . Carpal tunnel syndrome, bilateral   . Chronic diastolic heart failure (HCC) 03/07/2020  . Coronary artery disease    50-70% LAD by 2001 cath  . Essential hypertension 02/25/2016  . GERD (gastroesophageal reflux disease)   . Hypercholesteremia   . Hyperlipidemia 02/25/2016  . Hypertension   . Obesity (BMI 30-39.9) 02/25/2016  . Pre-diabetes   . Shortness of breath 02/25/2016  . Sleep apnea    does not use CPAP  . Stroke [facility]) ~2010   mini stroke     Past Surgical History:  Procedure Laterality Date  . CARDIAC CATHETERIZATION  2001  . CARPAL TUNNEL RELEASE Right 07/06/2018   Procedure: RIGHT CARPAL TUNNEL RELEASE, EXTENDED EXCISION;  Surgeon: 09/05/2018, MD;  Location: [facility];  Service: Orthopedics;  Laterality: Right;  .  CARPAL TUNNEL RELEASE Left 04/12/2019   Procedure: CARPAL TUNNEL RELEASE;  Surgeon: Cindee SaltKuzma, Gary, MD;  Location: [facility];  Service: Orthopedics;  Laterality: Left;  . COLONOSCOPY WITH PROPOFOL N/A 05/21/2015   Procedure: COLONOSCOPY WITH PROPOFOL;  Surgeon: Charolett BumpersMartin K Johnson, MD;  Location: [facility];  Service: Endoscopy;  Laterality: N/A;  . EYE SURGERY     bilateral cataracts  . FRACTURE SURGERY  1966   compound arm  . HERNIA REPAIR    . LAPAROSCOPIC APPENDECTOMY N/A 10/18/2013   Procedure: APPENDECTOMY LAPAROSCOPIC;  Surgeon: Cherylynn RidgesJames O Wyatt, MD;  Location: [facility] OR;  Service: General;  Laterality: N/A;  . SUBMANDIBULAR GLAND EXCISION Left 09/02/2017    Left submandibular gland  and associated soft tissue and lymph nodes  . SUBMANDIBULAR GLAND EXCISION Left 09/02/2017   Procedure: EXCISION LEFT SUBMANDIBULAR GLAND;  Surgeon: Serena Colonelosen, Jefry, MD;  [facility];  Service: ENT;  Laterality: Left;  . TESTICLE REMOVAL  1965     Current Outpatient Medications  Medication Sig Dispense Refill  . albuterol (PROVENTIL HFA;VENTOLIN HFA) 108 (90 BASE) MCG/ACT inhaler Inhale 1-2 puffs into the lungs every 6 (six) hours as needed for wheezing or shortness of breath. Reported on 05/08/2016    . amLODipine (NORVASC) 10 MG tablet Take 10 mg by mouth at bedtime.     [name]. atorvastatin (LIPITOR) 40 MG tablet Take 40 mg by mouth at bedtime.     . carvedilol (COREG) 6.25 MG tablet Take 1 tablet (6.25 mg total) by mouth 2 (two) times daily. 180 tablet 3  . clopidogrel (PLAVIX) 75 MG tablet Take 75 mg by mouth daily with breakfast.    . doxazosin (CARDURA) 8 MG tablet Take 1 tablet (8 mg total) by mouth daily. 90 tablet 3  . ferrous sulfate 325 (65 FE) MG tablet Take 325 mg by mouth daily with breakfast.    . Fish Oil-Cholecalciferol (FISH OIL + D3 PO) Take 1 capsule by mouth every morning.     . hydrALAZINE (APRESOLINE) 100 MG tablet TAKE ONE (1) TABLET BY MOUTH TWO (2) TIMES DAILY 180 tablet 1  . ibuprofen (ADVIL) 200 MG tablet Take 400 mg by mouth every 6 (six) hours as needed for moderate pain. Reported on 05/08/2016    . loratadine (CLARITIN) 10 MG tablet Take 10 mg by mouth every morning.     . Multiple Vitamin (MULTI VITAMIN MENS PO) Take 1 tablet by mouth every evening.     . pantoprazole (PROTONIX) 20 MG tablet TAKE ONE (1) TABLET EACH DAY 30 tablet 0  . Polyethyl Glycol-Propyl Glycol (SYSTANE OP) Place 1 drop into both eyes at bedtime.     . valsartan-hydrochlorothiazide (DIOVAN-HCT) 320-25 MG tablet Take 1 tablet by mouth daily. 30 tablet 6  . spironolactone (ALDACTONE) 25 MG tablet Take 0.5 tablets (12.5 mg total) by mouth daily. 15 tablet 1   No current facility-administered  medications for this visit.    Allergies:   Penicillins and Sulfur    Social History:  The patient  reports that he quit smoking about 59 years ago. He has a 0.13 pack-year smoking history. He has never used smokeless tobacco. He reports that he does not drink alcohol and does not use drugs.   Family History:  The patient's family history includes Cancer in his brother; HIV/AIDS (age of onset: 6725) in his daughter; Heart disease in his father; Other in his son; Stroke in his father.    ROS:  Please see the history of  present illness.   Otherwise, review of systems are positive for none.   All other systems are reviewed and negative.    PHYSICAL EXAM: VS:  BP (!) 140/52   Pulse 66   Ht 5\' 8"  (1.727 m)   Wt 225 lb (102.1 kg)   SpO2 95%   BMI 34.21 kg/m  , BMI Body mass index is 34.21 kg/m. GENERAL:  Well appearing HEENT: Pupils equal round and reactive, fundi not visualized, oral mucosa unremarkable NECK:  No jugular venous distention, waveform within normal limits, carotid upstroke brisk and symmetric, no bruits LUNGS:  Clear to auscultation bilaterally HEART:  RRR.  PMI not displaced or sustained,S1 and S2 within normal limits, no S3, no S4, no clicks, no rubs, no murmurs ABD:  Flat, positive bowel sounds normal in frequency in pitch, no bruits, no rebound, no guarding, no midline pulsatile mass, no hepatomegaly, no splenomegaly EXT:  1+ DP/PT bilaterally, no edema, no cyanosis no clubbing SKIN:  No rashes no nodules NEURO:  Cranial nerves II through XII grossly intact, motor grossly intact throughout PSYCH:  Cognitively intact, oriented to person place and time 20 995  EKG:  EKG is not ordered today. The ekg ordered 02/25/16 demonstrates sinus rhythm. Rate 63 bpm. Nonspecific T wave flattening. 06/24/18: Sinus bradycardia.  Rate 59 bpm.   02/24/20: Sinus rhythm.  Rate 66 bpm.   Echo 03/19/16: Study Conclusions  - Left ventricle: The cavity size was normal. There was   mild-moderate concentric hypertrophy. Systolic function was  normal. The estimated ejection fraction was in the range of 55%  to 60%. Wall motion was normal; there were no regional wall  motion abnormalities. Features are consistent with a pseudonormal  left ventricular filling pattern, with concomitant abnormal  relaxation and increased filling pressure (grade 2 diastolic  dysfunction). Doppler parameters are consistent with  indeterminate ventricular filling pressure. - Aortic valve: Transvalvular velocity was within the normal range.  There was no stenosis. There was no regurgitation. Valve area  (VTI): 3.1 cm^2. Valve area (Vmax): 3.11 cm^2. Valve area  (Vmean): 3.04 cm^2. - Mitral valve: Transvalvular velocity was within the normal range.  There was no evidence for stenosis. There was no regurgitation. - Left atrium: The appendage was moderately dilated. - Right ventricle: The cavity size was mildly dilated. Wall  thickness was normal. Systolic function was normal. - Tricuspid valve: There was mild regurgitation. - Pulmonary arteries: Systolic pressure was within the normal  range. PA peak pressure: 35 mm Hg (S). - Inferior vena cava: The vessel was normal in size. The  respirophasic diameter changes were in the normal range (= 50%),  consistent with normal central venous pressure.  Exercise Myoview 03/19/16:   Nuclear stress EF: 59%. No wall motion abnormality  The left ventricular ejection fraction is normal (55-65%).  There was no ST segment deviation noted during stress. There were frequent PVCs during exercise, rare couplet  This is a low risk perfusion study, no ischemia identified. Question if frequent PVCs are contributing to dyspnea on exertion. Fair exercise effort of 6 minutes and 13 seconds. No restaurants. Recent Labs: 05/16/2020: BUN 18; Creatinine, Ser 0.93; Potassium 5.2; Sodium 132   02/16/2018: Total cholesterol 135, triglycerides 185, HDL 42,  LDL 55 Potassium 4.3, creatinine 1.03 ALT 9.0  Lipid Panel    Component Value Date/Time   CHOL  10/17/2007 0540    118        ATP III CLASSIFICATION:  <200     mg/dL  Desirable  200-239  mg/dL   Borderline High  >=716    mg/dL   High   TRIG 967 89/38/1017 0540   HDL 28 (L) 10/17/2007 0540   CHOLHDL 4.2 10/17/2007 0540   VLDL 20 10/17/2007 0540   LDLCALC  10/17/2007 0540    70        Total Cholesterol/HDL:CHD Risk Coronary Heart Disease Risk Table                     Men   Women  1/2 Average Risk   3.4   3.3      Wt Readings from Last 3 Encounters:  09/26/20 225 lb (102.1 kg)  06/20/20 224 lb 9.6 oz (101.9 kg)  03/30/20 226 lb 6.4 oz (102.7 kg)      ASSESSMENT AND PLAN:  # Chronic diastolic heart failure: # Shortness of breath:  Mr. Lauder has chronic LE edema that is very stable.  In fact, he appears euvolemic today.  Grade 2 diastolic function on echo.  PYP was negative for amyloidosis.  I suspect this is mostly due to obesity and deconditioning.  Encouraged him to start walking and build up to at least 150 minutes of exercise weekly.  Check BNP.  # Resistant hypertension: Blood pressure is above goal today.  He is unsure what he has been running at home.  We will supply him with a blood pressure cuff given that he has difficulty affording.  Continue amlodipine, carvedilol, doxazosin, hydralazine, valsartan, hydrochlorothiazide, and spironolactone.  She will work on limiting sodium intake and increase his exercise.  Renal artery Dopplers are normal 02/2020.  # PVCs: Controlled on carvedilol.  # Fatigue:  Check TSH.  OK to take testosterone supplement to achieve levels that are normal for his age.  # Hyperlipidemia:  Continue atorvastatin.  LDL was 52 on 04/2020.  # Obesity: Mr. Morini was encouraged to increase his physical activity to at least 30-40 minutes most days of the week.     Current medicines are reviewed at length with the patient today.  The patient does  not have concerns regarding medicines.  The following changes have been made: Increase hydralazine.  Labs/ tests ordered today include:   Orders Placed This Encounter  Procedures  . TSH  . B Nat Peptide     Disposition:   FU with Cage Gupton C. Duke Salvia, MD, [facility] in 6 months.   Signed, Quentin Shorey C. Duke Salvia, MD, [facility]  09/26/2020 12:51 PM    [facility]

## 2020-09-27 LAB — TSH: TSH: 2.81 u[IU]/mL (ref 0.450–4.500)

## 2020-09-27 LAB — BRAIN NATRIURETIC PEPTIDE: BNP: 176 pg/mL — ABNORMAL HIGH (ref 0.0–100.0)

## 2020-10-01 ENCOUNTER — Telehealth: Payer: Self-pay | Admitting: Cardiovascular Disease

## 2020-10-01 ENCOUNTER — Other Ambulatory Visit: Payer: Self-pay

## 2020-10-01 DIAGNOSIS — I5032 Chronic diastolic (congestive) heart failure: Secondary | ICD-10-CM

## 2020-10-01 MED ORDER — FUROSEMIDE 20 MG PO TABS: 20.0000 mg | ORAL_TABLET | Freq: Every day | ORAL | 3 refills | Status: DC

## 2020-10-01 NOTE — Telephone Encounter (Signed)
Jeff Watkins is calling stating our office advised they keep track of Jeff Watkins's BP readings, but she is wanting to know if that is necessary due to Jeff Watkins's BP being recorded everyday for 3 months. Please advise.

## 2020-10-01 NOTE — Telephone Encounter (Signed)
Unable to reach pt or leave a message  

## 2020-10-08 ENCOUNTER — Telehealth: Payer: Self-pay

## 2020-10-08 DIAGNOSIS — Z0000 Encounter for general adult medical examination without abnormal findings: Secondary | ICD-10-CM

## 2020-10-08 NOTE — Telephone Encounter (Signed)
Called patient about returning [employer] devices that were mailed to his home at the beginning of the program. The patient stated that he returned the devices in their original packages to the same carrier that dropped them off. Care Guide will inform [employer] of this issue.

## 2020-10-09 NOTE — Telephone Encounter (Signed)
Left message for Jeff Watkins that we just need her bp tracked once daily by some one. She is to call back if she still has questions.

## 2020-10-10 DIAGNOSIS — J45991 Cough variant asthma: Secondary | ICD-10-CM | POA: Diagnosis not present

## 2020-10-10 DIAGNOSIS — M4722 Other spondylosis with radiculopathy, cervical region: Secondary | ICD-10-CM | POA: Diagnosis not present

## 2020-10-10 DIAGNOSIS — J45901 Unspecified asthma with (acute) exacerbation: Secondary | ICD-10-CM | POA: Diagnosis not present

## 2020-10-10 DIAGNOSIS — K219 Gastro-esophageal reflux disease without esophagitis: Secondary | ICD-10-CM | POA: Diagnosis not present

## 2020-10-10 DIAGNOSIS — E038 Other specified hypothyroidism: Secondary | ICD-10-CM | POA: Diagnosis not present

## 2020-10-10 DIAGNOSIS — E1122 Type 2 diabetes mellitus with diabetic chronic kidney disease: Secondary | ICD-10-CM | POA: Diagnosis not present

## 2020-10-10 DIAGNOSIS — I2510 Atherosclerotic heart disease of native coronary artery without angina pectoris: Secondary | ICD-10-CM | POA: Diagnosis not present

## 2020-10-10 DIAGNOSIS — D509 Iron deficiency anemia, unspecified: Secondary | ICD-10-CM | POA: Diagnosis not present

## 2020-10-10 DIAGNOSIS — I10 Essential (primary) hypertension: Secondary | ICD-10-CM | POA: Diagnosis not present

## 2020-10-31 DIAGNOSIS — I10 Essential (primary) hypertension: Secondary | ICD-10-CM | POA: Diagnosis not present

## 2020-10-31 DIAGNOSIS — E291 Testicular hypofunction: Secondary | ICD-10-CM | POA: Diagnosis not present

## 2020-10-31 DIAGNOSIS — E1122 Type 2 diabetes mellitus with diabetic chronic kidney disease: Secondary | ICD-10-CM | POA: Diagnosis not present

## 2020-11-01 ENCOUNTER — Telehealth: Payer: Self-pay | Admitting: Cardiovascular Disease

## 2020-11-01 DIAGNOSIS — Z79899 Other long term (current) drug therapy: Secondary | ICD-10-CM

## 2020-11-01 DIAGNOSIS — E291 Testicular hypofunction: Secondary | ICD-10-CM | POA: Diagnosis not present

## 2020-11-01 DIAGNOSIS — I10 Essential (primary) hypertension: Secondary | ICD-10-CM

## 2020-11-01 DIAGNOSIS — E1122 Type 2 diabetes mellitus with diabetic chronic kidney disease: Secondary | ICD-10-CM | POA: Diagnosis not present

## 2020-11-01 NOTE — Telephone Encounter (Signed)
Pt c/o medication issue:  1. Name of Medication: spironolactone (ALDACTONE) 25 MG tablet(Expired) carvedilol (COREG) 6.25 MG tablet 2. How are you currently taking this medication (dosage and times per day)? Coreg 1 tablet by mouth two times daily, not currently taking spironolactone   3. Are you having a reaction (difficulty breathing--STAT)? No  4. What is your medication issue? Jeff Watkins is calling stating his PCP advised him they have spironolactone on his current medication list and he states he was unaware he was supposed to be taking this. He states he was under the impression these medication were for the same thing. He is requesting a callback for confirmation. Please advise.

## 2020-11-01 NOTE — Telephone Encounter (Signed)
Spoke with patient who states that he does not ever remember taking spironolactone 12.5 mg. Went over patients medication list with patient, patient states that he is taking all of his medications on the list except for the spironolactone. Advised patient that per recent office note from visit with Dr. Duke Salvia 12/1 it is listed as a current medication.   Per Recent Office Note with Dr. Duke Salvia "Continue amlodipine, carvedilol, doxazosin, hydralazine, valsartan, hydrochlorothiazide, and spironolactone".   Patient states that he would like to check with Dr. Duke Salvia to make sure she still would like for patient to take this medication as patient states he does not recall ever being on it. Patient states that his blood pressure yesterday was 132/68- and denies any type of symptoms. Patient unable to check blood pressure today as blood pressure cuff is charging.   Patient states that if Dr. Duke Salvia would like for him to start the Spironolactone he will need a new prescription for it. Advised patient that this message will be forwarded to Dr. Duke Salvia for review. Patient verbalized understanding.

## 2020-11-02 ENCOUNTER — Other Ambulatory Visit: Payer: Self-pay | Admitting: Cardiovascular Disease

## 2020-11-02 MED ORDER — SPIRONOLACTONE 25 MG PO TABS: 12.5000 mg | ORAL_TABLET | Freq: Every day | ORAL | 1 refills | Status: DC

## 2020-11-02 NOTE — Telephone Encounter (Signed)
If is BP is running >130/80 then I think he should start it and check a BMP in a week.

## 2020-11-02 NOTE — Telephone Encounter (Signed)
° ° °  Pt is returning call, pt would to get a cb today

## 2020-11-02 NOTE — Telephone Encounter (Signed)
Patient advised to start spironolactone per MD notes. Advised to check BP at least once daily. BMET ordered to be done after he has been on medication for 1 week

## 2020-11-08 DIAGNOSIS — R1084 Generalized abdominal pain: Secondary | ICD-10-CM | POA: Diagnosis not present

## 2020-11-08 DIAGNOSIS — I2510 Atherosclerotic heart disease of native coronary artery without angina pectoris: Secondary | ICD-10-CM | POA: Diagnosis not present

## 2020-11-08 DIAGNOSIS — K529 Noninfective gastroenteritis and colitis, unspecified: Secondary | ICD-10-CM | POA: Diagnosis not present

## 2020-11-08 DIAGNOSIS — E1122 Type 2 diabetes mellitus with diabetic chronic kidney disease: Secondary | ICD-10-CM | POA: Diagnosis not present

## 2020-11-13 DIAGNOSIS — J45991 Cough variant asthma: Secondary | ICD-10-CM | POA: Diagnosis not present

## 2020-11-13 DIAGNOSIS — E1122 Type 2 diabetes mellitus with diabetic chronic kidney disease: Secondary | ICD-10-CM | POA: Diagnosis not present

## 2020-11-13 DIAGNOSIS — M4722 Other spondylosis with radiculopathy, cervical region: Secondary | ICD-10-CM | POA: Diagnosis not present

## 2020-11-13 DIAGNOSIS — I2510 Atherosclerotic heart disease of native coronary artery without angina pectoris: Secondary | ICD-10-CM | POA: Diagnosis not present

## 2020-11-13 DIAGNOSIS — I10 Essential (primary) hypertension: Secondary | ICD-10-CM | POA: Diagnosis not present

## 2020-11-13 DIAGNOSIS — K219 Gastro-esophageal reflux disease without esophagitis: Secondary | ICD-10-CM | POA: Diagnosis not present

## 2020-11-13 DIAGNOSIS — D509 Iron deficiency anemia, unspecified: Secondary | ICD-10-CM | POA: Diagnosis not present

## 2020-11-13 DIAGNOSIS — E038 Other specified hypothyroidism: Secondary | ICD-10-CM | POA: Diagnosis not present

## 2020-11-13 DIAGNOSIS — J45901 Unspecified asthma with (acute) exacerbation: Secondary | ICD-10-CM | POA: Diagnosis not present

## 2020-11-14 ENCOUNTER — Other Ambulatory Visit: Payer: Self-pay

## 2020-11-14 DIAGNOSIS — I5032 Chronic diastolic (congestive) heart failure: Secondary | ICD-10-CM | POA: Diagnosis not present

## 2020-11-15 LAB — BASIC METABOLIC PANEL
BUN/Creatinine Ratio: 17 (ref 10–24)
BUN: 19 mg/dL (ref 8–27)
CO2: 26 mmol/L (ref 20–29)
Calcium: 8.9 mg/dL (ref 8.6–10.2)
Chloride: 98 mmol/L (ref 96–106)
Creatinine, Ser: 1.13 mg/dL (ref 0.76–1.27)
GFR calc Af Amer: 70 mL/min/{1.73_m2} (ref >59)
GFR calc non Af Amer: 60 mL/min/{1.73_m2} (ref >59)
Glucose: 122 mg/dL — ABNORMAL HIGH (ref 65–99)
Potassium: 4.5 mmol/L (ref 3.5–5.2)
Sodium: 137 mmol/L (ref 134–144)

## 2020-11-29 DIAGNOSIS — H43811 Vitreous degeneration, right eye: Secondary | ICD-10-CM | POA: Diagnosis not present

## 2020-11-29 DIAGNOSIS — H02831 Dermatochalasis of right upper eyelid: Secondary | ICD-10-CM | POA: Diagnosis not present

## 2020-11-29 DIAGNOSIS — Z961 Presence of intraocular lens: Secondary | ICD-10-CM | POA: Diagnosis not present

## 2020-11-29 DIAGNOSIS — H1045 Other chronic allergic conjunctivitis: Secondary | ICD-10-CM | POA: Diagnosis not present

## 2020-11-29 DIAGNOSIS — H04123 Dry eye syndrome of bilateral lacrimal glands: Secondary | ICD-10-CM | POA: Diagnosis not present

## 2020-11-29 DIAGNOSIS — E119 Type 2 diabetes mellitus without complications: Secondary | ICD-10-CM | POA: Diagnosis not present

## 2020-11-29 DIAGNOSIS — H02834 Dermatochalasis of left upper eyelid: Secondary | ICD-10-CM | POA: Diagnosis not present

## 2020-12-21 DIAGNOSIS — D509 Iron deficiency anemia, unspecified: Secondary | ICD-10-CM | POA: Diagnosis not present

## 2020-12-21 DIAGNOSIS — M4722 Other spondylosis with radiculopathy, cervical region: Secondary | ICD-10-CM | POA: Diagnosis not present

## 2020-12-21 DIAGNOSIS — E1122 Type 2 diabetes mellitus with diabetic chronic kidney disease: Secondary | ICD-10-CM | POA: Diagnosis not present

## 2020-12-21 DIAGNOSIS — I2510 Atherosclerotic heart disease of native coronary artery without angina pectoris: Secondary | ICD-10-CM | POA: Diagnosis not present

## 2020-12-21 DIAGNOSIS — K219 Gastro-esophageal reflux disease without esophagitis: Secondary | ICD-10-CM | POA: Diagnosis not present

## 2020-12-21 DIAGNOSIS — J45901 Unspecified asthma with (acute) exacerbation: Secondary | ICD-10-CM | POA: Diagnosis not present

## 2020-12-21 DIAGNOSIS — E038 Other specified hypothyroidism: Secondary | ICD-10-CM | POA: Diagnosis not present

## 2020-12-21 DIAGNOSIS — J45991 Cough variant asthma: Secondary | ICD-10-CM | POA: Diagnosis not present

## 2020-12-21 DIAGNOSIS — I10 Essential (primary) hypertension: Secondary | ICD-10-CM | POA: Diagnosis not present

## 2021-01-09 ENCOUNTER — Other Ambulatory Visit: Payer: Self-pay | Admitting: Cardiovascular Disease

## 2021-01-18 DIAGNOSIS — D509 Iron deficiency anemia, unspecified: Secondary | ICD-10-CM | POA: Diagnosis not present

## 2021-01-18 DIAGNOSIS — J45901 Unspecified asthma with (acute) exacerbation: Secondary | ICD-10-CM | POA: Diagnosis not present

## 2021-01-18 DIAGNOSIS — I10 Essential (primary) hypertension: Secondary | ICD-10-CM | POA: Diagnosis not present

## 2021-01-18 DIAGNOSIS — M4722 Other spondylosis with radiculopathy, cervical region: Secondary | ICD-10-CM | POA: Diagnosis not present

## 2021-01-18 DIAGNOSIS — J45991 Cough variant asthma: Secondary | ICD-10-CM | POA: Diagnosis not present

## 2021-01-18 DIAGNOSIS — K219 Gastro-esophageal reflux disease without esophagitis: Secondary | ICD-10-CM | POA: Diagnosis not present

## 2021-01-18 DIAGNOSIS — I2510 Atherosclerotic heart disease of native coronary artery without angina pectoris: Secondary | ICD-10-CM | POA: Diagnosis not present

## 2021-01-18 DIAGNOSIS — E038 Other specified hypothyroidism: Secondary | ICD-10-CM | POA: Diagnosis not present

## 2021-01-18 DIAGNOSIS — E1122 Type 2 diabetes mellitus with diabetic chronic kidney disease: Secondary | ICD-10-CM | POA: Diagnosis not present

## 2021-02-28 DIAGNOSIS — K219 Gastro-esophageal reflux disease without esophagitis: Secondary | ICD-10-CM | POA: Diagnosis not present

## 2021-02-28 DIAGNOSIS — J45901 Unspecified asthma with (acute) exacerbation: Secondary | ICD-10-CM | POA: Diagnosis not present

## 2021-02-28 DIAGNOSIS — M4722 Other spondylosis with radiculopathy, cervical region: Secondary | ICD-10-CM | POA: Diagnosis not present

## 2021-02-28 DIAGNOSIS — E1122 Type 2 diabetes mellitus with diabetic chronic kidney disease: Secondary | ICD-10-CM | POA: Diagnosis not present

## 2021-02-28 DIAGNOSIS — D509 Iron deficiency anemia, unspecified: Secondary | ICD-10-CM | POA: Diagnosis not present

## 2021-02-28 DIAGNOSIS — I10 Essential (primary) hypertension: Secondary | ICD-10-CM | POA: Diagnosis not present

## 2021-02-28 DIAGNOSIS — I2510 Atherosclerotic heart disease of native coronary artery without angina pectoris: Secondary | ICD-10-CM | POA: Diagnosis not present

## 2021-02-28 DIAGNOSIS — J45991 Cough variant asthma: Secondary | ICD-10-CM | POA: Diagnosis not present

## 2021-02-28 DIAGNOSIS — E038 Other specified hypothyroidism: Secondary | ICD-10-CM | POA: Diagnosis not present

## 2021-03-28 ENCOUNTER — Other Ambulatory Visit: Payer: Self-pay | Admitting: Cardiovascular Disease

## 2021-03-29 ENCOUNTER — Ambulatory Visit: Payer: Medicare PPO | Admitting: Cardiovascular Disease

## 2021-04-02 ENCOUNTER — Other Ambulatory Visit (HOSPITAL_BASED_OUTPATIENT_CLINIC_OR_DEPARTMENT_OTHER): Payer: Self-pay

## 2021-04-02 ENCOUNTER — Ambulatory Visit: Payer: Medicare PPO

## 2021-04-02 MED ORDER — PFIZER-BIONT COVID-19 VAC-TRIS 30 MCG/0.3ML IM SUSP
INTRAMUSCULAR | 0 refills | Status: AC
  Filled 2021-04-02: qty 0.3, 1d supply, fill #0

## 2021-04-02 NOTE — Progress Notes (Signed)
   Covid-19 Vaccination Clinic  Name:  Jeff Watkins    MRN: [id number] DOB: 03-20-1936  04/02/2021  Jeff Watkins was observed post Covid-19 immunization for 15 minutes without incident. He was provided with Vaccine Information Sheet and instruction to access the V-Safe system.   Jeff Watkins was instructed to call 911 with any severe reactions post vaccine: [name] Difficulty breathing  . Swelling of face and throat  . A fast heartbeat  . A bad rash all over body  . Dizziness and weakness

## 2021-04-18 ENCOUNTER — Encounter: Payer: Self-pay | Admitting: Cardiovascular Disease

## 2021-04-18 ENCOUNTER — Ambulatory Visit (INDEPENDENT_AMBULATORY_CARE_PROVIDER_SITE_OTHER): Payer: Medicare PPO | Admitting: Cardiovascular Disease

## 2021-04-18 ENCOUNTER — Other Ambulatory Visit: Payer: Self-pay

## 2021-04-18 VITALS — BP 122/52 | HR 42 | Ht 68.0 in | Wt 224.4 lb

## 2021-04-18 DIAGNOSIS — E782 Mixed hyperlipidemia: Secondary | ICD-10-CM

## 2021-04-18 DIAGNOSIS — Z5181 Encounter for therapeutic drug level monitoring: Secondary | ICD-10-CM

## 2021-04-18 DIAGNOSIS — I2510 Atherosclerotic heart disease of native coronary artery without angina pectoris: Secondary | ICD-10-CM | POA: Diagnosis not present

## 2021-04-18 DIAGNOSIS — I639 Cerebral infarction, unspecified: Secondary | ICD-10-CM | POA: Diagnosis not present

## 2021-04-18 DIAGNOSIS — G4733 Obstructive sleep apnea (adult) (pediatric): Secondary | ICD-10-CM

## 2021-04-18 DIAGNOSIS — I5032 Chronic diastolic (congestive) heart failure: Secondary | ICD-10-CM | POA: Diagnosis not present

## 2021-04-18 DIAGNOSIS — I10 Essential (primary) hypertension: Secondary | ICD-10-CM

## 2021-04-18 HISTORY — DX: Obstructive sleep apnea (adult) (pediatric): G47.33

## 2021-04-18 HISTORY — DX: Atherosclerotic heart disease of native coronary artery without angina pectoris: I25.10

## 2021-04-18 HISTORY — DX: Cerebral infarction, unspecified: I63.9

## 2021-04-18 MED ORDER — SPIRONOLACTONE 25 MG PO TABS: 25.0000 mg | ORAL_TABLET | Freq: Every day | ORAL | 3 refills | Status: DC

## 2021-04-18 NOTE — Assessment & Plan Note (Signed)
Non-obstructive CAD.  He has no angina.  Continue clopidogrel, carvedilol, and atorvastatin.

## 2021-04-18 NOTE — Assessment & Plan Note (Signed)
Lipids are well-controlled.  LDL was 52 on 04/2020.  Continue atorvastatin.

## 2021-04-18 NOTE — Assessment & Plan Note (Signed)
Occurred around 2009.  Continue atorvastatin, clopidogrel, and blood pressure control.

## 2021-04-18 NOTE — Assessment & Plan Note (Addendum)
Blood pressure was initially elevated but better on repeat.  We will hold the carvedilol due to bradycardia.  Continue amlodipine, doxazosin, hydralazine, spironolactone, and valsartan/HCTZ.  He will check his blood pressures at home and bring to follow-up.  He does have a history of PVCs.  We will have to watch this as we hold carvedilol.

## 2021-04-18 NOTE — Assessment & Plan Note (Signed)
He is euvolemic on exam and blood pressure was better controlled on repeat.  However his bradycardia may be contributing to fatigue and shortness of breath.  We will have him stop the carvedilol.  Continue hydralazine, spironolactone, valsartan, and HCTZ.

## 2021-04-18 NOTE — Progress Notes (Signed)
Cardiology Office Note  Date:  02/24/20  ID:  Jeff KidneyUdell L Strycharz, DOB 03/03/1936, MRN [id number]  PCP:  Lorenda IshiharaVaradarajan, Rupashree, MD  Cardiologist:   Chilton Siiffany Dayton, MD   No chief complaint on file.  History of Present Illness: Jeff Watkins is a 85 y.o. male with moderate CAD, hypertension, hyperlipidemia, OSA, GERD and diabetes who presents for follow up.  Jeff Watkins was first seen on 5/1 with a complaint of dyspnea on exertion.  He was referred for an echo that showed LVEF 55-60% and grade 2 diastolic dysfunction.  He also had an exercise Myoview that was negative for ischemia but did reveal frequent PVCs.  He exercised for 6 minutes on the Bruce protocol (5.4 METS). At that appointment his losartan/HCTZ was increased due to poorly-controlled hypertension. Hydralazine was then increased to 50mg  bid.  Jeff Watkins had PVCs and metoprolol was increased.  Jeff Watkins had a home health visit through his insurance.  He was noted to have some mild edema and his BP was elevated.  He also had carpal tunnel surgery.  He has had carpal tunnel in both arms.  At his last appointment there was concern for cardiac amyloidosis.  He was referred for an ATT amyloid scan that was negative.  Hydralazine was also increased due to poorly controlled hypertension.  Due to his exertional dyspnea his echo was repeated.  His echo 02/2020 revealed LVEF 60 to 65% with grade 2 diastolic dysfunction.     Jeff Watkins's BP was averaging 124/62 but labile.  He was switched from metoprolol to carvedilol. He reported shortness of breath and was asked to increase his exercise. His blood pressure was elevated in the office but he was unsure what it was running at home. He was given a blood pressure cuff and was encouraged to check it regularly. He called the office and noted that he was not taking the spironolactone that was prescribed, so this was resumed 10/2020.   Today, he is accompanied by his wife, who also provides some history. Overall he is  feeling good, but he continues to have shortness of breath, and is easily fatigued (low stamina). He constantly feels cold as well, and needs to dress in layers. Lately, he has not monitored his heart rate at home. When he stands up, he sometimes feels "staggery" and has to steady himself. Also, he reports having a large amount of mucus/phlegm after waking up to use the restroom, possibly from sinus drainage. He endorses having allergies. For activity, he does minor tasks in the house but does not formally exercise. Sometimes he will walk rather than drive to other places if they are nearby. Typically he wakes up several times throughout the night. He notes being able to fall asleep again fairly easily. Previously, he tried using a CPAP for a sleep study, but felt too claustrophobic and could not use the mask. He believes Dr. Sherin QuarryWeissman followed his sleep apnea at that time. Plavix has been in his regimen for 13-14 years since he had a stroke. He denies any chest pain, or palpitations. No headaches, lightheadedness, or syncope to report. Also has no lower extremity edema, orthopnea or PND.   Past Medical History:  Diagnosis Date   Arthritis    Asthma    Bronchitis    CAD in native artery 04/18/2021   Carpal tunnel syndrome, bilateral    Chronic diastolic heart failure (HCC) 03/07/2020   Coronary artery disease    50-70% LAD by 2001 cath  Essential hypertension 02/25/2016   GERD (gastroesophageal reflux disease)    Hypercholesteremia    Hyperlipidemia 02/25/2016   Hypertension    Obesity (BMI 30-39.9) 02/25/2016   OSA (obstructive sleep apnea) 04/18/2021   Pre-diabetes    Shortness of breath 02/25/2016   Sleep apnea    does not use CPAP   Stroke (HCC) ~2010   mini stroke    Stroke (HCC) 04/18/2021    Past Surgical History:  Procedure Laterality Date   CARDIAC CATHETERIZATION  2001   CARPAL TUNNEL RELEASE Right 07/06/2018   Procedure: RIGHT CARPAL TUNNEL RELEASE, EXTENDED EXCISION;  Surgeon: Cindee Salt, MD;  Location: [facility];  Service: Orthopedics;  Laterality: Right;   CARPAL TUNNEL RELEASE Left 04/12/2019   Procedure: CARPAL TUNNEL RELEASE;  Surgeon: Cindee Salt, MD;  Location: [facility];  Service: Orthopedics;  Laterality: Left;   COLONOSCOPY WITH PROPOFOL N/A 05/21/2015   Procedure: COLONOSCOPY WITH PROPOFOL;  Surgeon: [name], MD;  Location: [facility];  Service: Endoscopy;  Laterality: N/A;   EYE SURGERY     bilateral cataracts   FRACTURE SURGERY  1966   compound arm   HERNIA REPAIR     LAPAROSCOPIC APPENDECTOMY N/A 10/18/2013   Procedure: APPENDECTOMY LAPAROSCOPIC;  Surgeon: [name], MD;  Location: [facility];  Service: General;  Laterality: N/A;   SUBMANDIBULAR GLAND EXCISION Left 09/02/2017    Left submandibular gland and associated soft tissue and lymph nodes   SUBMANDIBULAR GLAND EXCISION Left 09/02/2017   Procedure: EXCISION LEFT SUBMANDIBULAR GLAND;  Surgeon: Serena Colonel, MD;  Location: [facility];  Service: ENT;  Laterality: Left;   TESTICLE REMOVAL  1965    Current Outpatient Medications  Medication Sig Dispense Refill   albuterol (PROVENTIL HFA;VENTOLIN HFA) 108 (90 BASE) MCG/ACT inhaler Inhale 1-2 puffs into the lungs every 6 (six) hours as needed for wheezing or shortness of breath. Reported on 05/08/2016     amLODipine (NORVASC) 10 MG tablet Take 10 mg by mouth at bedtime.      atorvastatin (LIPITOR) 40 MG tablet Take 40 mg by mouth at bedtime.      clopidogrel (PLAVIX) 75 MG tablet Take 75 mg by mouth daily with breakfast.     COVID-19 mRNA Vac-TriS, Pfizer, (PFIZER-BIONT COVID-19 VAC-TRIS) SUSP injection Inject into the muscle. 0.3 mL 0   doxazosin (CARDURA) 8 MG tablet Take 1 tablet (8 mg total) by mouth daily. 90 tablet 3   ferrous sulfate 325 (65 FE) MG tablet Take 325 mg by mouth daily with breakfast.     Fish Oil-Cholecalciferol (FISH OIL + D3 PO) Take 1 capsule by mouth every morning.      hydrALAZINE (APRESOLINE)  100 MG tablet TAKE ONE (1) TABLET BY MOUTH TWO (2) TIMES DAILY 180 tablet 1   ibuprofen (ADVIL) 200 MG tablet Take 400 mg by mouth every 6 (six) hours as needed for moderate pain. Reported on 05/08/2016     loratadine (CLARITIN) 10 MG tablet Take 10 mg by mouth every morning.      Multiple Vitamin (MULTI VITAMIN MENS PO) Take 1 tablet by mouth every evening.      pantoprazole (PROTONIX) 20 MG tablet TAKE ONE (1) TABLET EACH DAY 30 tablet 0   Polyethyl Glycol-Propyl Glycol (SYSTANE OP) Place 1 drop into both eyes at bedtime.      Testosterone 20.25 MG/1.25GM (1.62%) GEL Place 2 Pump onto the skin every other day.     valsartan-hydrochlorothiazide (DIOVAN-HCT) 320-25 MG tablet  Take 1 tablet by mouth daily. 30 tablet 6   furosemide (LASIX) 20 MG tablet Take 1 tablet (20 mg total) by mouth daily. 90 tablet 3   spironolactone (ALDACTONE) 25 MG tablet Take 1 tablet (25 mg total) by mouth daily. 90 tablet 3   No current facility-administered medications for this visit.    Allergies:   Elemental sulfur and Penicillins    Social History:  The patient  reports that he quit smoking about 60 years ago. He has a 0.13 pack-year smoking history. He has never used smokeless tobacco. He reports that he does not drink alcohol and does not use drugs.   Family History:  The patient's family history includes Cancer in his brother; HIV/AIDS (age of onset: 78) in his daughter; Heart disease in his father; Other in his son; Stroke in his father.    ROS:   Please see the history of present illness. (+) Shortness of breath (+) Fatigue (+) Increased mucus/sputum production (+) Coldness All other systems are reviewed and negative.    PHYSICAL EXAM: VS:  BP (!) 122/52 (BP Location: Left Arm, Patient Position: Sitting)   Pulse (!) 42   Ht 5\' 8"  (1.727 m)   Wt 224 lb 6.4 oz (101.8 kg)   BMI 34.12 kg/m  , BMI Body mass index is 34.12 kg/m. GENERAL:  Well appearing HEENT: Pupils equal round and reactive, fundi  not visualized, oral mucosa unremarkable NECK:  No jugular venous distention, waveform within normal limits, carotid upstroke brisk and symmetric, no bruits LUNGS:  Clear to auscultation bilaterally HEART:  RRR.  PMI not displaced or sustained,S1 and S2 within normal limits, no S3, no S4, no clicks, no rubs, no murmurs ABD:  Flat, positive bowel sounds normal in frequency in pitch, no bruits, no rebound, no guarding, no midline pulsatile mass, no hepatomegaly, no splenomegaly EXT:  1+ DP/PT bilaterally, no edema, no cyanosis no clubbing SKIN:  No rashes no nodules NEURO:  Cranial nerves II through XII grossly intact, motor grossly intact throughout PSYCH:  Cognitively intact, oriented to person place and time  EKG:   02/25/16: sinus rhythm. Rate 63 bpm. Nonspecific T wave flattening. 06/24/18: Sinus bradycardia.  Rate 59 bpm.   02/24/20: Sinus rhythm.  Rate 66 bpm.  09/26/2020: EKG was not ordered. 04/18/2021: Sinus bradycardia. Rate 42 bpm.  Echo 03/15/2020: 1. Left ventricular ejection fraction, by estimation, is 60 to 65%. The  left ventricle has normal function. The left ventricle has no regional  wall motion abnormalities. Left ventricular diastolic parameters are  consistent with Grade II diastolic  dysfunction (pseudonormalization).   2. Right ventricular systolic function is normal. The right ventricular  size is normal. There is mildly elevated pulmonary artery systolic  pressure.   3. Left atrial size was mildly dilated.   4. The mitral valve is normal in structure. No evidence of mitral valve  regurgitation. No evidence of mitral stenosis.   5. The aortic valve is tricuspid. Aortic valve regurgitation is mild. No  aortic stenosis is present.   6. Aortic dilatation noted. There is mild dilatation of the ascending  aorta measuring 42 mm.   7. The inferior vena cava is normal in size with greater than 50%  respiratory variability, suggesting right atrial pressure of 3  mmHg.  Myocardial Amyloid Imaging Planar and Spect 03/14/2020: The study is normal.   The study is normal. The study is not suggestive of TTR amyloidosis (visual score of 0 or H/CL ratio<1).  Renal Artery  Duplex 03/14/2020: Summary:  Renal:     Right: Normal size right kidney. Normal right Resisitive Index.         Normal cortical thickness of right kidney. No evidence of         right renal artery stenosis. RRV flow present.  Left:  Normal size of left kidney. Abnormal left Resisitve Index.         Normal cortical thickness of the left kidney. No evidence of         left renal artery stenosis. LRV flow present. Cyst(s) noted.         measuring 3.8 cm 3.8 cm.  Mesenteric:  Normal Celiac artery and Superior Mesenteric artery findings.   Echo 03/19/16: Study Conclusions   - Left ventricle: The cavity size was normal. There was   mild-moderate concentric hypertrophy. Systolic function was   normal. The estimated ejection fraction was in the range of 55%   to 60%. Wall motion was normal; there were no regional wall   motion abnormalities. Features are consistent with a pseudonormal   left ventricular filling pattern, with concomitant abnormal   relaxation and increased filling pressure (grade 2 diastolic   dysfunction). Doppler parameters are consistent with   indeterminate ventricular filling pressure. - Aortic valve: Transvalvular velocity was within the normal range.   There was no stenosis. There was no regurgitation. Valve area   (VTI): 3.1 cm^2. Valve area (Vmax): 3.11 cm^2. Valve area   (Vmean): 3.04 cm^2. - Mitral valve: Transvalvular velocity was within the normal range.   There was no evidence for stenosis. There was no regurgitation. - Left atrium: The appendage was moderately dilated. - Right ventricle: The cavity size was mildly dilated. Wall   thickness was normal. Systolic function was normal. - Tricuspid valve: There was mild regurgitation. - Pulmonary arteries:  Systolic pressure was within the normal   range. PA peak pressure: 35 mm Hg (S). - Inferior vena cava: The vessel was normal in size. The   respirophasic diameter changes were in the normal range (= 50%),   consistent with normal central venous pressure.  Exercise Myoview 03/19/16:  Nuclear stress EF: 59%. No wall motion abnormality The left ventricular ejection fraction is normal (55-65%). There was no ST segment deviation noted during stress. There were frequent PVCs during exercise, rare couplet This is a low risk perfusion study, no ischemia identified. Question if frequent PVCs are contributing to dyspnea on exertion. Fair exercise effort of 6 minutes and 13 seconds.  Recent Labs: 09/26/2020: BNP 176.0; TSH 2.810 11/14/2020: BUN 19; Creatinine, Ser 1.13; Potassium 4.5; Sodium 137   02/16/2018: Total cholesterol 135, triglycerides 185, HDL 42, LDL 55 Potassium 4.3, creatinine 1.03 ALT 9.0  Lipid Panel    Component Value Date/Time   CHOL  10/17/2007 0540    118        ATP III CLASSIFICATION:  <200     mg/dL   Desirable  132-440  mg/dL   Borderline High  >=102    mg/dL   High   TRIG 725 36/64/4034 0540   HDL 28 (L) 10/17/2007 0540   CHOLHDL 4.2 10/17/2007 0540   VLDL 20 10/17/2007 0540   LDLCALC  10/17/2007 0540    70        Total Cholesterol/HDL:CHD Risk Coronary Heart Disease Risk Table                     Men   Women  1/2 Average Risk  3.4   3.3      Wt Readings from Last 3 Encounters:  04/18/21 224 lb 6.4 oz (101.8 kg)  09/26/20 225 lb (102.1 kg)  06/20/20 224 lb 9.6 oz (101.9 kg)      ASSESSMENT AND PLAN:  Chronic diastolic heart failure (HCC) He is euvolemic on exam and blood pressure was better controlled on repeat.  However his bradycardia may be contributing to fatigue and shortness of breath.  We will have him stop the carvedilol.  Continue hydralazine, spironolactone, valsartan, and HCTZ.  Essential hypertension Blood pressure was initially elevated  but better on repeat.  We will hold the carvedilol due to bradycardia.  Continue amlodipine, doxazosin, hydralazine, spironolactone, and valsartan/HCTZ.  He will check his blood pressures at home and bring to follow-up.  He does have a history of PVCs.  We will have to watch this as we hold carvedilol.  OSA (obstructive sleep apnea) He has a history of OSA but has not tolerated masks in the past due to claustrophobia.  We discussed the fact that not being treated could be contributing to his fatigue and daytime somnolence.  This is also complicated by the fact that he has frequent nocturia.  We offered referral to our sleep specialist.  He will follow-up with his primary care provider with this concern later this month.  Hyperlipidemia Lipids are well-controlled.  LDL was 52 on 04/2020.  Continue atorvastatin.  CAD in native artery Non-obstructive CAD.  He has no angina.  Continue clopidogrel, carvedilol, and atorvastatin.  Stroke [facility]) Occurred around 2009.  Continue atorvastatin, clopidogrel, and blood pressure control.    Current medicines are reviewed at length with the patient today.  The patient does not have concerns regarding medicines.  The following changes have been made: Increase hydralazine.  Labs/ tests ordered today include:   Orders Placed This Encounter  Procedures   Basic metabolic panel   EKG 12-Lead     Disposition:   FU with Tonyetta Berko C. Duke Salvia, MD, [facility] in 2 months.  I,Mathew Stumpf,acting as a Neurosurgeon for [name], MD.,have documented all relevant documentation on the behalf of [name], MD,as directed by  [name], MD while in the presence of [name], MD.  I, Abigail Teall C. Duke Salvia, MD have reviewed all documentation for this visit.  The documentation of the exam, diagnosis, procedures, and orders on 04/18/2021 are all accurate and complete.   Signed, Sofie Schendel C. Duke Salvia, MD, [facility]  04/18/2021 1:05 PM    [facility]

## 2021-04-18 NOTE — Patient Instructions (Addendum)
Medication Instructions:  STOP CARVEDILOL   INCREASE SPIRONOLACTONE TO FULL TABLET DAILY   *If you need a refill on your cardiac medications before your next appointment, please call your pharmacy*  Lab Work: BMET IN 1 WEEK   If you have labs (blood work) drawn today and your tests are completely normal, you will receive your results only by: MyChart Message (if you have MyChart) OR A paper copy in the mail If you have any lab test that is abnormal or we need to change your treatment, we will call you to review the results.  Testing/Procedures: NONE   Follow-Up: At [facility], you and your health needs are our priority.  As part of our continuing mission to provide you with exceptional heart care, we have created designated Provider Care Teams.  These Care Teams include your primary Cardiologist (physician) and Advanced Practice Providers (APPs -  Physician Assistants and Nurse Practitioners) who all work together to provide you with the care you need, when you need it.  We recommend signing up for the patient portal called "MyChart".  Sign up information is provided on this After Visit Summary.  MyChart is used to connect with patients for Virtual Visits (Telemedicine).  Patients are able to view lab/test results, encounter notes, upcoming appointments, etc.  Non-urgent messages can be sent to your provider as well.   To learn more about what you can do with MyChart, go to [email or web address].    Your next appointment:   6 month(s)  The format for your next appointment:   In Person  Provider:   DR [facility] AT [facility] LOCATION   Your physician recommends that you schedule a follow-up appointment in: 2 MONTHS WITH CAITLIN NP AT [facility] LOCATION   MONITOR AND LOG YOUR BLOOD PRESSURE, BRING READINGS AND MACHINE TO FOLLOW UP

## 2021-04-18 NOTE — Assessment & Plan Note (Signed)
He has a history of OSA but has not tolerated masks in the past due to claustrophobia.  We discussed the fact that not being treated could be contributing to his fatigue and daytime somnolence.  This is also complicated by the fact that he has frequent nocturia.  We offered referral to our sleep specialist.  He will follow-up with his primary care provider with this concern later this month.

## 2021-04-20 ENCOUNTER — Other Ambulatory Visit: Payer: Self-pay | Admitting: Cardiovascular Disease

## 2021-04-23 NOTE — Telephone Encounter (Signed)
Rx(s) sent to pharmacy electronically.  

## 2021-04-25 DIAGNOSIS — Z5181 Encounter for therapeutic drug level monitoring: Secondary | ICD-10-CM | POA: Diagnosis not present

## 2021-04-25 DIAGNOSIS — I10 Essential (primary) hypertension: Secondary | ICD-10-CM | POA: Diagnosis not present

## 2021-04-25 LAB — BASIC METABOLIC PANEL
BUN/Creatinine Ratio: 16 (ref 10–24)
BUN: 19 mg/dL (ref 8–27)
CO2: 22 mmol/L (ref 20–29)
Calcium: 9.5 mg/dL (ref 8.6–10.2)
Chloride: 92 mmol/L — ABNORMAL LOW (ref 96–106)
Creatinine, Ser: 1.18 mg/dL (ref 0.76–1.27)
Glucose: 208 mg/dL — ABNORMAL HIGH (ref 65–99)
Potassium: 5 mmol/L (ref 3.5–5.2)
Sodium: 129 mmol/L — ABNORMAL LOW (ref 134–144)
eGFR: 61 mL/min/{1.73_m2} (ref >59)

## 2021-05-02 ENCOUNTER — Other Ambulatory Visit: Payer: Self-pay | Admitting: *Deleted

## 2021-05-02 DIAGNOSIS — Z5181 Encounter for therapeutic drug level monitoring: Secondary | ICD-10-CM

## 2021-05-02 DIAGNOSIS — R7989 Other specified abnormal findings of blood chemistry: Secondary | ICD-10-CM

## 2021-05-02 DIAGNOSIS — I10 Essential (primary) hypertension: Secondary | ICD-10-CM

## 2021-05-10 DIAGNOSIS — R609 Edema, unspecified: Secondary | ICD-10-CM | POA: Diagnosis not present

## 2021-05-10 DIAGNOSIS — Z0001 Encounter for general adult medical examination with abnormal findings: Secondary | ICD-10-CM | POA: Diagnosis not present

## 2021-05-10 DIAGNOSIS — I10 Essential (primary) hypertension: Secondary | ICD-10-CM | POA: Diagnosis not present

## 2021-05-10 DIAGNOSIS — D509 Iron deficiency anemia, unspecified: Secondary | ICD-10-CM | POA: Diagnosis not present

## 2021-05-10 DIAGNOSIS — Z1159 Encounter for screening for other viral diseases: Secondary | ICD-10-CM | POA: Diagnosis not present

## 2021-05-10 DIAGNOSIS — J45991 Cough variant asthma: Secondary | ICD-10-CM | POA: Diagnosis not present

## 2021-05-10 DIAGNOSIS — I2510 Atherosclerotic heart disease of native coronary artery without angina pectoris: Secondary | ICD-10-CM | POA: Diagnosis not present

## 2021-05-10 DIAGNOSIS — K219 Gastro-esophageal reflux disease without esophagitis: Secondary | ICD-10-CM | POA: Diagnosis not present

## 2021-05-10 DIAGNOSIS — E1122 Type 2 diabetes mellitus with diabetic chronic kidney disease: Secondary | ICD-10-CM | POA: Diagnosis not present

## 2021-05-10 DIAGNOSIS — E038 Other specified hypothyroidism: Secondary | ICD-10-CM | POA: Diagnosis not present

## 2021-05-10 DIAGNOSIS — E291 Testicular hypofunction: Secondary | ICD-10-CM | POA: Diagnosis not present

## 2021-05-17 DIAGNOSIS — I10 Essential (primary) hypertension: Secondary | ICD-10-CM | POA: Diagnosis not present

## 2021-05-17 DIAGNOSIS — I2510 Atherosclerotic heart disease of native coronary artery without angina pectoris: Secondary | ICD-10-CM | POA: Diagnosis not present

## 2021-05-17 DIAGNOSIS — J45991 Cough variant asthma: Secondary | ICD-10-CM | POA: Diagnosis not present

## 2021-05-17 DIAGNOSIS — K219 Gastro-esophageal reflux disease without esophagitis: Secondary | ICD-10-CM | POA: Diagnosis not present

## 2021-05-17 DIAGNOSIS — J45901 Unspecified asthma with (acute) exacerbation: Secondary | ICD-10-CM | POA: Diagnosis not present

## 2021-05-17 DIAGNOSIS — E038 Other specified hypothyroidism: Secondary | ICD-10-CM | POA: Diagnosis not present

## 2021-05-17 DIAGNOSIS — E1122 Type 2 diabetes mellitus with diabetic chronic kidney disease: Secondary | ICD-10-CM | POA: Diagnosis not present

## 2021-05-31 ENCOUNTER — Ambulatory Visit (HOSPITAL_BASED_OUTPATIENT_CLINIC_OR_DEPARTMENT_OTHER): Payer: Medicare PPO | Admitting: Family

## 2021-06-24 ENCOUNTER — Ambulatory Visit (INDEPENDENT_AMBULATORY_CARE_PROVIDER_SITE_OTHER): Payer: Medicare PPO

## 2021-06-24 ENCOUNTER — Ambulatory Visit: Payer: Medicare PPO | Admitting: Orthopedic Surgery

## 2021-06-24 ENCOUNTER — Other Ambulatory Visit: Payer: Self-pay

## 2021-06-24 ENCOUNTER — Encounter: Payer: Self-pay | Admitting: Orthopedic Surgery

## 2021-06-24 DIAGNOSIS — M79674 Pain in right toe(s): Secondary | ICD-10-CM

## 2021-06-24 DIAGNOSIS — L03031 Cellulitis of right toe: Secondary | ICD-10-CM

## 2021-06-24 MED ORDER — DOXYCYCLINE HYCLATE 100 MG PO TABS: 100.0000 mg | ORAL_TABLET | Freq: Two times a day (BID) | ORAL | 0 refills | Status: DC

## 2021-06-24 NOTE — Progress Notes (Signed)
Office Visit Note   Patient: Jeff Watkins           Date of Birth: 1936/05/03           MRN: [id number] Visit Date: 06/24/2021              Requested by: Lorenda Ishihara, MD [address] [place],  [place] [address] PCP: Lorenda Ishihara, MD  Chief Complaint  Patient presents with   Right Great Toe - Pain      HPI: Patient is an 85 year old gentleman who presents complaining of redness swelling and drainage from the base of the right great toenail.  Patient denies any trauma denies any history of gout.  Assessment & Plan: Visit Diagnoses:  1. Great toe pain, right   2. Paronychia of great toe of right foot     Plan: Nail was removed without complications he will start Dial soap cleansing tomorrow with antibiotic ointment and a Band-Aid dressing change daily prescription for doxycycline.  Follow-Up Instructions: Return in about 1 week (around 07/01/2021).   Ortho Exam  Patient is alert, oriented, no adenopathy, well-dressed, normal affect, normal respiratory effort. Examination patient has a good dorsalis pedis and posterior tibial pulse.  There is no redness or cellulitis in his foot he does have a paronychial infection of the right great toe with drainage from the base of the nail.  No pain with range of motion of the joints no clinical signs of gout.  After informed consent patient underwent a digital block with 10 cc of 1% lidocaine plain.  The nail was removed without complication a sterile dressing was applied with Xeroform.  Patient was provided a postoperative shoe.  We will start daily dressing changes tomorrow prescription for doxycycline.  Imaging: XR Toe Great Right  Result Date: 06/24/2021 2 view radiographs of the right great toe shows no destructive bony changes.  No images are attached to the encounter.  Labs: Lab Results  Component Value Date   HGBA1C 6.6 (H) 09/02/2017   HGBA1C  10/17/2007    6.0 (NOTE)   The ADA recommends the  following therapeutic goals for glycemic   control related to Hgb A1C measurement:   Goal of Therapy:   < 7.0% Hgb A1C   Action Suggested:  > 8.0% Hgb A1C   Ref:  Diabetes Care, 22, Suppl. 1, 1999   REPTSTATUS 07/22/2009 FINAL 07/21/2009   CULT NO GROWTH 07/21/2009     Lab Results  Component Value Date   ALBUMIN 3.9 10/17/2013   ALBUMIN 2.7 (L) 07/23/2009   ALBUMIN 2.8 (L) 07/18/2009   PREALBUMIN 9.5 (L) 07/20/2009    Lab Results  Component Value Date   MG 2.4 10/20/2013   No results found for: [facility]  Lab Results  Component Value Date   PREALBUMIN 9.5 (L) 07/20/2009   CBC EXTENDED Latest Ref Rng & Units 08/31/2017 10/21/2013 10/20/2013  WBC 4.0 - 10.5 K/uL 5.2 5.8 6.9  RBC 4.22 - 5.81 MIL/uL 4.62 4.42 4.67  HGB 13.0 - 17.0 g/dL 03.5 46.5 68.1  HCT 27.5 - 52.0 % 41.4 41.0 43.2  PLT 150 - 400 K/uL 207 222 220  NEUTROABS 1.7 - 7.7 K/uL - - -  LYMPHSABS 0.7 - 4.0 K/uL - - -     There is no height or weight on file to calculate BMI.  Orders:  Orders Placed This Encounter  Procedures   XR Toe Great Right   Meds ordered this encounter  Medications  doxycycline (VIBRA-TABS) 100 MG tablet    Sig: Take 1 tablet (100 mg total) by mouth 2 (two) times daily.    Dispense:  20 tablet    Refill:  0     Procedures: No procedures performed  Clinical Data: No additional findings.  ROS:  All other systems negative, except as noted in the HPI. Review of Systems  Objective: Vital Signs: There were no vitals taken for this visit.  Specialty Comments:  No specialty comments available.  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   Bilateral carpal tunnel syndrome 06/10/2017   Essential hypertension 02/25/2016   Hyperlipidemia 02/25/2016   Shortness of  breath 02/25/2016   Obesity (BMI 30-39.9) 02/25/2016   Appendicitis 10/17/2013   Past Medical History:  Diagnosis Date   Arthritis    Asthma    Bronchitis    CAD in native artery 04/18/2021   Carpal tunnel syndrome, bilateral    Chronic diastolic heart failure (HCC) 03/07/2020   Coronary artery disease    50-70% LAD by 2001 cath   Essential hypertension 02/25/2016   GERD (gastroesophageal reflux disease)    Hypercholesteremia    Hyperlipidemia 02/25/2016   Hypertension    Obesity (BMI 30-39.9) 02/25/2016   OSA (obstructive sleep apnea) 04/18/2021   Pre-diabetes    Shortness of breath 02/25/2016   Sleep apnea    does not use CPAP   Stroke (HCC) ~2010   mini stroke    Stroke [facility]) 04/18/2021    Family History  Problem Relation Age of Onset   Heart disease Father    Stroke Father    Cancer Brother        colon   Other Son        Paraplegic from motorcycle accident   HIV/AIDS Daughter 25    Past Surgical History:  Procedure Laterality Date   CARDIAC CATHETERIZATION  2001   CARPAL TUNNEL RELEASE Right 07/06/2018   Procedure: RIGHT CARPAL TUNNEL RELEASE, EXTENDED EXCISION;  Surgeon: Cindee Salt, MD;  Location: [facility];  Service: Orthopedics;  Laterality: Right;   CARPAL TUNNEL RELEASE Left 04/12/2019   Procedure: CARPAL TUNNEL RELEASE;  Surgeon: Cindee Salt, MD;  Location: [facility];  Service: Orthopedics;  Laterality: Left;   COLONOSCOPY WITH PROPOFOL N/A 05/21/2015   Procedure: COLONOSCOPY WITH PROPOFOL;  Surgeon: [name], MD;  Location: [facility];  Service: Endoscopy;  Laterality: N/A;   EYE SURGERY     bilateral cataracts   FRACTURE SURGERY  1966   compound arm   HERNIA REPAIR     LAPAROSCOPIC APPENDECTOMY N/A 10/18/2013   Procedure: APPENDECTOMY LAPAROSCOPIC;  Surgeon: [name], MD;  Location: [facility];  Service: General;  Laterality: N/A;   SUBMANDIBULAR GLAND EXCISION Left 09/02/2017    Left submandibular gland and associated  soft tissue and lymph nodes   SUBMANDIBULAR GLAND EXCISION Left 09/02/2017   Procedure: EXCISION LEFT SUBMANDIBULAR GLAND;  Surgeon: Serena Colonel, MD;  [facility]: [facility];  Service: ENT;  Laterality: Left;   TESTICLE REMOVAL  1965   Social History   Occupational History   Occupation: retired  Tobacco Use   Smoking status: Former    Packs/day: 0.25    Years: 0.50    Pack years: 0.13  Types: Cigarettes    Quit date: 10/27/1960    Years since quitting: 60.6   Smokeless tobacco: Never  Vaping Use   Vaping Use: Never used  Substance and Sexual Activity   Alcohol use: No   Drug use: No   Sexual activity: Not on file

## 2021-07-02 ENCOUNTER — Ambulatory Visit (INDEPENDENT_AMBULATORY_CARE_PROVIDER_SITE_OTHER): Payer: Medicare PPO | Admitting: Physician Assistant

## 2021-07-02 ENCOUNTER — Encounter: Payer: Self-pay | Admitting: Orthopedic Surgery

## 2021-07-02 ENCOUNTER — Other Ambulatory Visit: Payer: Self-pay

## 2021-07-02 DIAGNOSIS — M79674 Pain in right toe(s): Secondary | ICD-10-CM

## 2021-07-02 NOTE — Progress Notes (Signed)
Office Visit Note   Patient: Jeff Watkins           Date of Birth: July 31, 1936           MRN: [id number] Visit Date: 07/02/2021              Requested by: Lorenda Ishihara, MD [address] [place],  [place] [address] PCP: Lorenda Ishihara, MD  No chief complaint on file.     HPI: Patient is 1 week status post excision great toenail. He is doing well wearing a regular shoe with a wide toebox.  Assessment & Plan: Visit Diagnoses: No diagnosis found.  Plan: Follow up for final visit in 3 weeks. May cleanse soap with antibactrial soap and water   Follow-Up Instructions: No follow-ups on file.   Ortho Exam  Patient is alert, oriented, no adenopathy, well-dressed, normal affect, normal respiratory effort. Great toenail. Minimal drainage, swelling well controlled no ascending cellulitis or signs of infection  Imaging: No results found. No images are attached to the encounter.  Labs: Lab Results  Component Value Date   HGBA1C 6.6 (H) 09/02/2017   HGBA1C  10/17/2007    6.0 (NOTE)   The ADA recommends the following therapeutic goals for glycemic   control related to Hgb A1C measurement:   Goal of Therapy:   < 7.0% Hgb A1C   Action Suggested:  > 8.0% Hgb A1C   Ref:  Diabetes Care, 22, Suppl. 1, 1999   REPTSTATUS 07/22/2009 FINAL 07/21/2009   CULT NO GROWTH 07/21/2009     Lab Results  Component Value Date   ALBUMIN 3.9 10/17/2013   ALBUMIN 2.7 (L) 07/23/2009   ALBUMIN 2.8 (L) 07/18/2009   PREALBUMIN 9.5 (L) 07/20/2009    Lab Results  Component Value Date   MG 2.4 10/20/2013   No results found for: [facility]  Lab Results  Component Value Date   PREALBUMIN 9.5 (L) 07/20/2009   CBC EXTENDED Latest Ref Rng & Units 08/31/2017 10/21/2013 10/20/2013  WBC 4.0 - 10.5 K/uL 5.2 5.8 6.9  RBC 4.22 - 5.81 MIL/uL 4.62 4.42 4.67  HGB 13.0 - 17.0 g/dL 43.3 29.5 18.8  HCT 41.6 - 52.0 % 41.4 41.0 43.2  PLT 150 - 400 K/uL 207 222 220  NEUTROABS 1.7 - 7.7  K/uL - - -  LYMPHSABS 0.7 - 4.0 K/uL - - -     There is no height or weight on file to calculate BMI.  Orders:  No orders of the defined types were placed in this encounter.  No orders of the defined types were placed in this encounter.    Procedures: No procedures performed  Clinical Data: No additional findings.  ROS:  All other systems negative, except as noted in the HPI. Review of Systems  Objective: Vital Signs: There were no vitals taken for this visit.  Specialty Comments:  No specialty comments available.  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   Bilateral carpal tunnel syndrome 06/10/2017   Essential hypertension 02/25/2016   Hyperlipidemia 02/25/2016   Shortness of breath 02/25/2016   Obesity (BMI 30-39.9) 02/25/2016   Appendicitis 10/17/2013   Past Medical History:  Diagnosis Date   Arthritis    Asthma    Bronchitis    CAD in  native artery 04/18/2021   Carpal tunnel syndrome, bilateral    Chronic diastolic heart failure (HCC) 03/07/2020   Coronary artery disease    50-70% LAD by 2001 cath   Essential hypertension 02/25/2016   GERD (gastroesophageal reflux disease)    Hypercholesteremia    Hyperlipidemia 02/25/2016   Hypertension    Obesity (BMI 30-39.9) 02/25/2016   OSA (obstructive sleep apnea) 04/18/2021   Pre-diabetes    Shortness of breath 02/25/2016   Sleep apnea    does not use CPAP   Stroke (HCC) ~2010   mini stroke    Stroke [facility]) 04/18/2021    Family History  Problem Relation Age of Onset   Heart disease Father    Stroke Father    Cancer Brother        colon   Other Son        Paraplegic from motorcycle accident   HIV/AIDS Daughter 25    Past Surgical History:  Procedure Laterality Date   CARDIAC CATHETERIZATION   2001   CARPAL TUNNEL RELEASE Right 07/06/2018   Procedure: RIGHT CARPAL TUNNEL RELEASE, EXTENDED EXCISION;  Surgeon: Cindee Salt, MD;  Location: [facility];  Service: Orthopedics;  Laterality: Right;   CARPAL TUNNEL RELEASE Left 04/12/2019   Procedure: CARPAL TUNNEL RELEASE;  Surgeon: Cindee Salt, MD;  Location: [facility];  Service: Orthopedics;  Laterality: Left;   COLONOSCOPY WITH PROPOFOL N/A 05/21/2015   Procedure: COLONOSCOPY WITH PROPOFOL;  Surgeon: [name], MD;  [facility];  Service: Endoscopy;  Laterality: N/A;   EYE SURGERY     bilateral cataracts   FRACTURE SURGERY  1966   compound arm   HERNIA REPAIR     LAPAROSCOPIC APPENDECTOMY N/A 10/18/2013   Procedure: APPENDECTOMY LAPAROSCOPIC;  Surgeon: [name], MD;  Location: [facility];  Service: General;  Laterality: N/A;   SUBMANDIBULAR GLAND EXCISION Left 09/02/2017    Left submandibular gland and associated soft tissue and lymph nodes   SUBMANDIBULAR GLAND EXCISION Left 09/02/2017   Procedure: EXCISION LEFT SUBMANDIBULAR GLAND;  Surgeon: Serena Colonel, MD;  Location: [facility] OR;  Service: ENT;  Laterality: Left;   TESTICLE REMOVAL  1965   Social History   Occupational History   Occupation: retired  Tobacco Use   Smoking status: Former    Packs/day: 0.25    Years: 0.50    Pack years: 0.13    Types: Cigarettes    Quit date: 10/27/1960    Years since quitting: 60.7   Smokeless tobacco: Never  Vaping Use   Vaping Use: Never used  Substance and Sexual Activity   Alcohol use: No   Drug use: No   Sexual activity: Not on file

## 2021-07-08 ENCOUNTER — Ambulatory Visit (HOSPITAL_BASED_OUTPATIENT_CLINIC_OR_DEPARTMENT_OTHER): Payer: Medicare PPO | Admitting: Family

## 2021-07-09 DIAGNOSIS — I2510 Atherosclerotic heart disease of native coronary artery without angina pectoris: Secondary | ICD-10-CM | POA: Diagnosis not present

## 2021-07-09 DIAGNOSIS — K219 Gastro-esophageal reflux disease without esophagitis: Secondary | ICD-10-CM | POA: Diagnosis not present

## 2021-07-09 DIAGNOSIS — E785 Hyperlipidemia, unspecified: Secondary | ICD-10-CM | POA: Diagnosis not present

## 2021-07-09 DIAGNOSIS — E669 Obesity, unspecified: Secondary | ICD-10-CM | POA: Diagnosis not present

## 2021-07-09 DIAGNOSIS — I4891 Unspecified atrial fibrillation: Secondary | ICD-10-CM | POA: Diagnosis not present

## 2021-07-09 DIAGNOSIS — I10 Essential (primary) hypertension: Secondary | ICD-10-CM | POA: Diagnosis not present

## 2021-07-09 DIAGNOSIS — D6869 Other thrombophilia: Secondary | ICD-10-CM | POA: Diagnosis not present

## 2021-07-09 DIAGNOSIS — J449 Chronic obstructive pulmonary disease, unspecified: Secondary | ICD-10-CM | POA: Diagnosis not present

## 2021-07-09 DIAGNOSIS — N529 Male erectile dysfunction, unspecified: Secondary | ICD-10-CM | POA: Diagnosis not present

## 2021-07-11 ENCOUNTER — Ambulatory Visit (HOSPITAL_BASED_OUTPATIENT_CLINIC_OR_DEPARTMENT_OTHER): Payer: Medicare PPO | Admitting: Family

## 2021-07-23 ENCOUNTER — Ambulatory Visit (INDEPENDENT_AMBULATORY_CARE_PROVIDER_SITE_OTHER): Payer: Medicare PPO | Admitting: Orthopedic Surgery

## 2021-07-23 DIAGNOSIS — L03031 Cellulitis of right toe: Secondary | ICD-10-CM | POA: Diagnosis not present

## 2021-07-24 ENCOUNTER — Other Ambulatory Visit: Payer: Self-pay

## 2021-07-24 ENCOUNTER — Ambulatory Visit (HOSPITAL_BASED_OUTPATIENT_CLINIC_OR_DEPARTMENT_OTHER): Payer: Medicare PPO | Admitting: Family

## 2021-07-24 ENCOUNTER — Ambulatory Visit (INDEPENDENT_AMBULATORY_CARE_PROVIDER_SITE_OTHER): Payer: Medicare PPO

## 2021-07-24 ENCOUNTER — Encounter (HOSPITAL_BASED_OUTPATIENT_CLINIC_OR_DEPARTMENT_OTHER): Payer: Self-pay | Admitting: Family

## 2021-07-24 VITALS — BP 130/40 | HR 53 | Ht 68.0 in | Wt 228.0 lb

## 2021-07-24 DIAGNOSIS — Z8673 Personal history of transient ischemic attack (TIA), and cerebral infarction without residual deficits: Secondary | ICD-10-CM

## 2021-07-24 DIAGNOSIS — I5032 Chronic diastolic (congestive) heart failure: Secondary | ICD-10-CM

## 2021-07-24 DIAGNOSIS — E782 Mixed hyperlipidemia: Secondary | ICD-10-CM | POA: Diagnosis not present

## 2021-07-24 DIAGNOSIS — G4733 Obstructive sleep apnea (adult) (pediatric): Secondary | ICD-10-CM | POA: Diagnosis not present

## 2021-07-24 DIAGNOSIS — R001 Bradycardia, unspecified: Secondary | ICD-10-CM | POA: Diagnosis not present

## 2021-07-24 DIAGNOSIS — I10 Essential (primary) hypertension: Secondary | ICD-10-CM | POA: Diagnosis not present

## 2021-07-24 DIAGNOSIS — I25118 Atherosclerotic heart disease of native coronary artery with other forms of angina pectoris: Secondary | ICD-10-CM

## 2021-07-24 NOTE — Progress Notes (Signed)
Office Visit    Patient Name: Jeff Watkins Date of Encounter: 07/24/2021  PCP:  Lorenda Ishihara, MD   [facility]  Cardiologist:  [name], MD  Advanced Practice Provider:  No care team member to display Electrophysiologist:  None    Chief Complaint    Jeff Watkins is a 85 y.o. male with a hx of moderate CAD, HTN, HLD, OSA, CVA, GERD, DM2 presents today for follow-up of hypertension and bradycardia  Past Medical History    Past Medical History:  Diagnosis Date   Arthritis    Asthma    Bronchitis    CAD in native artery 04/18/2021   Carpal tunnel syndrome, bilateral    Chronic diastolic heart failure (HCC) 03/07/2020   Coronary artery disease    50-70% LAD by 2001 cath   Essential hypertension 02/25/2016   GERD (gastroesophageal reflux disease)    Hypercholesteremia    Hyperlipidemia 02/25/2016   Hypertension    Obesity (BMI 30-39.9) 02/25/2016   OSA (obstructive sleep apnea) 04/18/2021   Pre-diabetes    Shortness of breath 02/25/2016   Sleep apnea    does not use CPAP   Stroke (HCC) ~2010   mini stroke    Stroke (HCC) 04/18/2021   Past Surgical History:  Procedure Laterality Date   CARDIAC CATHETERIZATION  2001   CARPAL TUNNEL RELEASE Right 07/06/2018   Procedure: RIGHT CARPAL TUNNEL RELEASE, EXTENDED EXCISION;  Surgeon: Cindee Salt, MD;  Location: [facility];  Service: Orthopedics;  Laterality: Right;   CARPAL TUNNEL RELEASE Left 04/12/2019   Procedure: CARPAL TUNNEL RELEASE;  Surgeon: Cindee Salt, MD;  Location:  SUR[facility]ERY CENTER;  Service: Orthopedics;  Laterality: Left;   COLONOSCOPY WITH PROPOFOL N/A 05/21/2015   Procedure: COLONOSCOPY WITH PROPOFOL;  Surgeon: [name], MD;  Location: [facility];  Service: Endoscopy;  Laterality: N/A;   EYE SURGERY     bilateral cataracts   FRACTURE SURGERY  1966   compound arm   HERNIA REPAIR     LAPAROSCOPIC APPENDECTOMY N/A 10/18/2013   Procedure:  APPENDECTOMY LAPAROSCOPIC;  Surgeon: [name], MD;  Location: [facility];  Service: General;  Laterality: N/A;   SUBMANDIBULAR GLAND EXCISION Left 09/02/2017    Left submandibular gland and associated soft tissue and lymph nodes   SUBMANDIBULAR GLAND EXCISION Left 09/02/2017   Procedure: EXCISION LEFT SUBMANDIBULAR GLAND;  Surgeon: Serena Colonel, MD;  Location: [facility];  Service: ENT;  Laterality: Left;   TESTICLE REMOVAL  1965    Allergies  Allergies  Allergen Reactions   Elemental Sulfur Hives    In genital area accompanied by blisters   Penicillins Rash    History of Present Illness    Jeff Watkins is a 85 y.o. male with a hx of moderate CAD, HTN, HLD, OSA, CVA, GERD, DM2  last seen 04/18/2021 by Dr. Duke Salvia.  Seen 04/18/21 with shortness of breath and easy fatigue. Previously intolerant of CPAP. Carvedilol was discontinued due to bradycardia.   He presents today for follow-up.  Notes he lost his wife 2 weeks ago somewhat unexpectedly.  She had had dementia for many years.  Offered my condolences.  He does have good community through his local church, [employer].  He does have a son who lives close by who is a paraplegic though is able to live independently and drives with a handicapped Zenaida Niece. Reports no shortness of breath nor dyspnea on exertion. Reports no chest pain, pressure, or  tightness. No edema, orthopnea, PND. Reports no palpitations.  No lightheadedness, dizziness, near-syncope, syncope  EKGs/Labs/Other Studies Reviewed:   The following studies were reviewed today: Echo 03/15/2020: 1. Left ventricular ejection fraction, by estimation, is 60 to 65%. The  left ventricle has normal function. The left ventricle has no regional  wall motion abnormalities. Left ventricular diastolic parameters are  consistent with Grade II diastolic  dysfunction (pseudonormalization).   2. Right ventricular systolic function is normal. The right ventricular  size is normal. There is mildly  elevated pulmonary artery systolic  pressure.   3. Left atrial size was mildly dilated.   4. The mitral valve is normal in structure. No evidence of mitral valve  regurgitation. No evidence of mitral stenosis.   5. The aortic valve is tricuspid. Aortic valve regurgitation is mild. No  aortic stenosis is present.   6. Aortic dilatation noted. There is mild dilatation of the ascending  aorta measuring 42 mm.   7. The inferior vena cava is normal in size with greater than 50%  respiratory variability, suggesting right atrial pressure of 3 mmHg.   Myocardial Amyloid Imaging Planar and Spect 03/14/2020: The study is normal.   The study is normal. The study is not suggestive of TTR amyloidosis (visual score of 0 or H/CL ratio<1).   Renal Artery Duplex 03/14/2020: Summary:  Renal:     Right: Normal size right kidney. Normal right Resisitive Index.         Normal cortical thickness of right kidney. No evidence of         right renal artery stenosis. RRV flow present.  Left:  Normal size of left kidney. Abnormal left Resisitve Index.         Normal cortical thickness of the left kidney. No evidence of         left renal artery stenosis. LRV flow present. Cyst(s) noted.         measuring 3.8 cm 3.8 cm.  Mesenteric:  Normal Celiac artery and Superior Mesenteric artery findings.    Echo 03/19/16: Study Conclusions   - Left ventricle: The cavity size was normal. There was   mild-moderate concentric hypertrophy. Systolic function was   normal. The estimated ejection fraction was in the range of 55%   to 60%. Wall motion was normal; there were no regional wall   motion abnormalities. Features are consistent with a pseudonormal   left ventricular filling pattern, with concomitant abnormal   relaxation and increased filling pressure (grade 2 diastolic   dysfunction). Doppler parameters are consistent with   indeterminate ventricular filling pressure. - Aortic valve: Transvalvular velocity was  within the normal range.   There was no stenosis. There was no regurgitation. Valve area   (VTI): 3.1 cm^2. Valve area (Vmax): 3.11 cm^2. Valve area   (Vmean): 3.04 cm^2. - Mitral valve: Transvalvular velocity was within the normal range.   There was no evidence for stenosis. There was no regurgitation. - Left atrium: The appendage was moderately dilated. - Right ventricle: The cavity size was mildly dilated. Wall   thickness was normal. Systolic function was normal. - Tricuspid valve: There was mild regurgitation. - Pulmonary arteries: Systolic pressure was within the normal   range. PA peak pressure: 35 mm Hg (S). - Inferior vena cava: The vessel was normal in size. The   respirophasic diameter changes were in the normal range (= 50%),   consistent with normal central venous pressure.   Exercise Myoview 03/19/16:   Nuclear stress EF:  59%. No wall motion abnormality The left ventricular ejection fraction is normal (55-65%). There was no ST segment deviation noted during stress. There were frequent PVCs during exercise, rare couplet This is a low risk perfusion study, no ischemia identified. Question if frequent PVCs are contributing to dyspnea on exertion. Fair exercise effort of 6 minutes and 13 seconds.   EKG:  No EKG is ordered today.  The ekg independently reviewed form 04/18/21 demonstrated sinus bradycardia 42 bpm with no acute ST/T wave changes.   Recent Labs: 09/26/2020: BNP 176.0; TSH 2.810 04/25/2021: BUN 19; Creatinine, Ser 1.18; Potassium 5.0; Sodium 129  Recent Lipid Panel    Component Value Date/Time   CHOL  10/17/2007 0540    118        ATP III CLASSIFICATION:  <200     mg/dL   Desirable  829-937  mg/dL   Borderline High  >=169    mg/dL   High   TRIG 678 93/81/0175 0540   HDL 28 (L) 10/17/2007 0540   CHOLHDL 4.2 10/17/2007 0540   VLDL 20 10/17/2007 0540   LDLCALC  10/17/2007 0540    70        Total Cholesterol/HDL:CHD Risk Coronary Heart Disease Risk Table                      Men   Women  1/2 Average Risk   3.4   3.3    Home Medications   Current Meds  Medication Sig   albuterol (PROVENTIL HFA;VENTOLIN HFA) 108 (90 BASE) MCG/ACT inhaler Inhale 1-2 puffs into the lungs every 6 (six) hours as needed for wheezing or shortness of breath. Reported on 05/08/2016   amLODipine (NORVASC) 10 MG tablet Take 10 mg by mouth at bedtime.    atorvastatin (LIPITOR) 40 MG tablet Take 40 mg by mouth at bedtime.    clopidogrel (PLAVIX) 75 MG tablet Take 75 mg by mouth daily with breakfast.   COVID-19 mRNA Vac-TriS, Pfizer, (PFIZER-BIONT COVID-19 VAC-TRIS) SUSP injection Inject into the muscle.   doxazosin (CARDURA) 8 MG tablet TAKE 1 TABLET BY MOUTH DAILY   doxycycline (VIBRA-TABS) 100 MG tablet Take 1 tablet (100 mg total) by mouth 2 (two) times daily.   ferrous sulfate 325 (65 FE) MG tablet Take 325 mg by mouth daily with breakfast.   Fish Oil-Cholecalciferol (FISH OIL + D3 PO) Take 1 capsule by mouth every morning.    furosemide (LASIX) 20 MG tablet Take 1 tablet (20 mg total) by mouth daily.   hydrALAZINE (APRESOLINE) 100 MG tablet TAKE ONE (1) TABLET BY MOUTH TWO (2) TIMES DAILY   ibuprofen (ADVIL) 200 MG tablet Take 400 mg by mouth every 6 (six) hours as needed for moderate pain. Reported on 05/08/2016   loratadine (CLARITIN) 10 MG tablet Take 10 mg by mouth every morning.    Multiple Vitamin (MULTI VITAMIN MENS PO) Take 1 tablet by mouth every evening.    pantoprazole (PROTONIX) 20 MG tablet TAKE ONE (1) TABLET EACH DAY   Polyethyl Glycol-Propyl Glycol (SYSTANE OP) Place 1 drop into both eyes at bedtime.    spironolactone (ALDACTONE) 25 MG tablet Take 1 tablet (25 mg total) by mouth daily.   Testosterone 20.25 MG/1.25GM (1.62%) GEL Place 2 Pump onto the skin every other day.   valsartan-hydrochlorothiazide (DIOVAN-HCT) 320-25 MG tablet Take 1 tablet by mouth daily.     Review of Systems      All other systems reviewed and are otherwise negative except  as  noted above.  Physical Exam    VS:  BP (!) 130/40 (BP Location: Left Arm, Patient Position: Sitting, Cuff Size: Normal)   Pulse (!) 53   Ht 5\' 8"  (1.727 m)   Wt 228 lb (103.4 kg)   SpO2 97%   BMI 34.67 kg/m  , BMI Body mass index is 34.67 kg/m.  Wt Readings from Last 3 Encounters:  07/24/21 228 lb (103.4 kg)  04/18/21 224 lb 6.4 oz (101.8 kg)  09/26/20 225 lb (102.1 kg)     GEN: Well nourished, well developed, in no acute distress. HEENT: normal. Neck: Supple, no JVD, carotid bruits, or masses. Cardiac: Bradycardic, RRR, no murmurs, rubs, or gallops. No clubbing, cyanosis, edema.  Radials/PT 2+ and equal bilaterally.  Respiratory:  Respirations regular and unlabored, clear to auscultation bilaterally. GI: Soft, nontender, nondistended. MS: No deformity or atrophy. Skin: Warm and dry, no rash. Neuro:  Strength and sensation are intact. Psych: Normal affect.  Assessment & Plan    Bradycardia -persistent bradycardia heart rate today 53 bpm despite discontinuation of carvedilol.  No lightheadedness, dizziness, near-syncope, syncope.  To rule out high-grade AV block or significant pause we will plan for 7-day ZIO monitor which was placed in clinic today.  Chronic diastolic heart failure - Euvolemic and well compensated on exam.  GDMT includes spironolactone, hydralazine, Lasix.  Grief-lost his wife 2 weeks ago and offered my condolences.   Offered referral to psychology for grief counseling which he politely declines.  He does have good support through his local church.  OSA -intolerant of CPAP due to claustrophobia.  Hyperlipidemia-continue atorvastatin.  CAD -nonobstructive coronary artery disease. Stable with no anginal symptoms. No indication for ischemic evaluation.  Continue atorvastatin, clopidogrel.  No beta-blocker due to bradycardia. Heart healthy diet and regular cardiovascular exercise encouraged.    History of CVA - Occurred around 2009.  Continue atorvastatin,  clopidogrel, blood pressure control.  Disposition: Follow up in 3 month(s) with Dr. 2010 or APP.  Signed, Duke Salvia, NP 07/24/2021, 10:56 AM [facility]

## 2021-07-24 NOTE — Patient Instructions (Addendum)
Medication Instructions:  Continue your current medications.   *If you need a refill on your cardiac medications before your next appointment, please call your pharmacy*   Lab Work: None ordered today.   Testing/Procedures: Your physician has recommended that you wear a Zio monitor.   This monitor is a medical device that records the heart's electrical activity. Doctors most often use these monitors to diagnose arrhythmias. Arrhythmias are problems with the speed or rhythm of the heartbeat. The monitor is a small device applied to your chest. You can wear one while you do your normal daily activities. While wearing this monitor if you have any symptoms to push the button and record what you felt. Once you have worn this monitor for the period of time provider prescribed (for 7 days), you will return the monitor device in the postage paid box. Once it is returned they will download the data collected and provide [place] with a report which the provider will then review and we will call you with those results. Important tips:  Avoid showering during the first 24 hours of wearing the monitor. Avoid excessive sweating to help maximize wear time. Do not submerge the device, no hot tubs, and no swimming pools. Keep any lotions or oils away from the patch. After 24 hours you may shower with the patch on. Take brief showers with your back facing the shower head.  Do not remove patch once it has been placed because that will interrupt data and decrease adhesive wear time. Push the button when you have any symptoms and write down what you were feeling. Once you have completed wearing your monitor, remove and place into box which has postage paid and place in your outgoing mailbox.  If for some reason you have misplaced your box then call our office and we can provide another box and/or mail it off for you.  Follow-Up: At [facility], you and your health needs are our priority.  As part of our continuing  mission to provide you with exceptional heart care, we have created designated Provider Care Teams.  These Care Teams include your primary Cardiologist (physician) and Advanced Practice Providers (APPs -  Physician Assistants and Nurse Practitioners) who all work together to provide you with the care you need, when you need it.  We recommend signing up for the patient portal called "MyChart".  Sign up information is provided on this After Visit Summary.  MyChart is used to connect with patients for Virtual Visits (Telemedicine).  Patients are able to view lab/test results, encounter notes, upcoming appointments, etc.  Non-urgent messages can be sent to your provider as well.   To learn more about what you can do with MyChart, go to [email or web address].    Your next appointment:   3 month(s)  The format for your next appointment:   In Person  Provider:   [name], MD

## 2021-08-05 DIAGNOSIS — R001 Bradycardia, unspecified: Secondary | ICD-10-CM | POA: Diagnosis not present

## 2021-08-06 ENCOUNTER — Ambulatory Visit: Payer: Medicare PPO | Admitting: Physician Assistant

## 2021-08-06 ENCOUNTER — Ambulatory Visit: Payer: Self-pay

## 2021-08-06 ENCOUNTER — Encounter: Payer: Self-pay | Admitting: Physician Assistant

## 2021-08-06 DIAGNOSIS — M545 Low back pain, unspecified: Secondary | ICD-10-CM

## 2021-08-06 DIAGNOSIS — M7062 Trochanteric bursitis, left hip: Secondary | ICD-10-CM

## 2021-08-06 MED ORDER — LIDOCAINE HCL 1 % IJ SOLN
3.0000 mL | INTRAMUSCULAR | Status: AC | PRN
  Administered 2021-08-06: 3 mL

## 2021-08-06 MED ORDER — METHYLPREDNISOLONE ACETATE 40 MG/ML IJ SUSP
40.0000 mg | INTRAMUSCULAR | Status: AC | PRN
  Administered 2021-08-06: 40 mg via INTRA_ARTICULAR

## 2021-08-06 NOTE — Addendum Note (Signed)
Addended by: Barbette Or on: 08/06/2021 02:07 PM   Modules accepted: Orders

## 2021-08-06 NOTE — Progress Notes (Signed)
Office Visit Note   Patient: Jeff Watkins           Date of Birth: 07-01-1936           MRN: [id number] Visit Date: 08/06/2021              Requested by: Lorenda Ishihara, MD [address] [place],  [place] [address] PCP: Lorenda Ishihara, MD   Assessment & Plan: Visit Diagnoses:  1. Acute bilateral low back pain without sciatica   2. Trochanteric bursitis of left hip     Plan: We will send him to formal therapy for IT band stretching back exercises core strengthening home exercise program and modalities.  Did show stretch for his IT band today.  Like to see him back in 4 weeks see how he does overall with the injection and therapy.  Questions were encouraged and answered at length.  Discussed with patient that he has trochanteric bursitis on the left hip and that the stretching and injection should help.  Currently he is having no radicular symptoms noted this morning back pain therefore physical therapy may help with this.  In regards to the left hip arthritis he is having no symptoms on exam today with internal rotation of the left hip.  However he may benefit from an intra-articular injection if he continues to have hip pain.  Follow-Up Instructions: No follow-ups on file.   Orders:  Orders Placed This Encounter  Procedures   Large Joint Inj   XR Lumbar Spine 2-3 Views   XR Pelvis 1-2 Views   No orders of the defined types were placed in this encounter.     Procedures: Large Joint Inj: L greater trochanter on 08/06/2021 11:37 AM Indications: pain Details: 22 G 1.5 in needle, lateral approach  Arthrogram: No  Medications: 3 mL lidocaine 1 %; 40 mg methylPREDNISolone acetate 40 MG/ML Outcome: tolerated well, no immediate complications Procedure, treatment alternatives, risks and benefits explained, specific risks discussed. Consent was given by the patient. Immediately prior to procedure a time out was called to verify the correct patient,  procedure, equipment, support staff and site/side marked as required. Patient was prepped and draped in the usual sterile fashion.      Clinical Data: No additional findings.   Subjective: Chief Complaint  Patient presents with   Lower Back - Pain   Right Hip - Pain   Left Hip - Pain    HPI Jeff Watkins 85 year old male comes in today with bilateral hip pain (right for 4 to 5 weeks.  No known injury.  Also having some low back pain particular in the morning.  Denies any radicular symptoms down either leg.  Denies any groin pain.  Pain is mostly the lateral aspect of the left hip.  He has used a Occupational hygienist to offload the hip.  He has had no injections physical therapy.  Tried some Tylenol.  Also is taking some oral pain medications that his previously deceased wife took. Patient is nondiabetic. Review of Systems Denies any fevers chills or recent vaccines.  Otherwise please see HPI.  Objective: Vital Signs: There were no vitals taken for this visit.  Physical Exam General: Well-developed well-nourished male no acute distress mood affect appropriate.  Walks with a slight shuffling gait.  No assistive device. Psych: Alert and oriented x3. Ortho Exam Bilateral hips fluid range of motion he has pain with external rotation of the left hip.  He has slight tenderness over the trochanteric region  both hips.  Straight leg raise is negative bilaterally. Specialty Comments:  No specialty comments available.  Imaging: XR Lumbar Spine 2-3 Views  Result Date: 08/06/2021 2 views lumbar spine.  Minimal degenerative changes.  Overall disc space well-maintained.  No acute fractures no spondylolisthesis.  No bony abnormalities otherwise.  Arthrosclerosis aorta noted.  XR Pelvis 1-2 Views  Result Date: 08/06/2021 AP pelvis: Bilateral hips well located.  No acute fractures.  Moderate narrowing left hip joint.  Superior osteophytes off the acetabulum bilaterally.  No other bony abnormalities.     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   Bilateral carpal tunnel syndrome 06/10/2017   Essential hypertension 02/25/2016   Hyperlipidemia 02/25/2016   Shortness of breath 02/25/2016   Appendicitis 10/17/2013   Past Medical History:  Diagnosis Date   Arthritis    Asthma    Bronchitis    CAD in native artery 04/18/2021   Carpal tunnel syndrome, bilateral    Chronic diastolic heart failure (HCC) 03/07/2020   Coronary artery disease    50-70% LAD by 2001 cath   Essential hypertension 02/25/2016   GERD (gastroesophageal reflux disease)    Hypercholesteremia    Hyperlipidemia 02/25/2016   Hypertension    Obesity (BMI 30-39.9) 02/25/2016   OSA (obstructive sleep apnea) 04/18/2021   Pre-diabetes    Shortness of breath 02/25/2016   Sleep apnea    does not use CPAP   Stroke (HCC) ~2010   mini stroke    Stroke [facility]) 04/18/2021    Family History  Problem Relation Age of Onset   Heart disease Father    Stroke Father    Cancer Brother        colon   Other Son        Paraplegic from motorcycle accident   HIV/AIDS Daughter 25    Past Surgical History:  Procedure Laterality Date   CARDIAC CATHETERIZATION  2001   CARPAL TUNNEL RELEASE Right 07/06/2018   Procedure: RIGHT CARPAL TUNNEL RELEASE, EXTENDED EXCISION;  Surgeon: Cindee Salt, MD;  Location: [facility];  Service: Orthopedics;  Laterality: Right;   CARPAL TUNNEL RELEASE Left 04/12/2019   Procedure: CARPAL TUNNEL RELEASE;  Surgeon: Cindee Salt, MD;  Location: [facility];  Service: Orthopedics;  Laterality: Left;   COLONOSCOPY WITH PROPOFOL N/A 05/21/2015   Procedure: COLONOSCOPY WITH PROPOFOL;  Surgeon: [name], MD;  Location: [facility];  Service: Endoscopy;  Laterality: N/A;    EYE SURGERY     bilateral cataracts   FRACTURE SURGERY  1966   compound arm   HERNIA REPAIR     LAPAROSCOPIC APPENDECTOMY N/A 10/18/2013   Procedure: APPENDECTOMY LAPAROSCOPIC;  Surgeon: [name], MD;  Location: [facility];  Service: General;  Laterality: N/A;   SUBMANDIBULAR GLAND EXCISION Left 09/02/2017    Left submandibular gland and associated soft tissue and lymph nodes   SUBMANDIBULAR GLAND EXCISION Left 09/02/2017   Procedure: EXCISION LEFT SUBMANDIBULAR GLAND;  Surgeon: Serena Colonel, MD;  Location: [facility];  Service: ENT;  Laterality: Left;   TESTICLE REMOVAL  1965   Social History   Occupational History   Occupation: retired  Tobacco Use   Smoking status: Former    Packs/day: 0.25    Years: 0.50    Pack years:  0.13    Types: Cigarettes    Quit date: 10/27/1960    Years since quitting: 60.8   Smokeless tobacco: Never  Vaping Use   Vaping Use: Never used  Substance and Sexual Activity   Alcohol use: No   Drug use: No   Sexual activity: Not on file

## 2021-08-21 ENCOUNTER — Encounter: Payer: Self-pay | Admitting: Orthopedic Surgery

## 2021-08-21 NOTE — Progress Notes (Signed)
Office Visit Note   Patient: Jeff Watkins           Date of Birth: [birth year]           MRN: [id number] Visit Date: 07/23/2021              Requested by: Lorenda Ishihara, MD [address] [place],  [place] [address] PCP: Lorenda Ishihara, MD  Chief Complaint  Patient presents with   Right Foot - Follow-up    Excision right GT nail 06/24/21      HPI: Patient is a 85 year old gentleman who presents in follow-up status post excision right great toenail for paronychial infection.  Patient has been using Neosporin and a Band-Aid  Assessment & Plan: Visit Diagnoses:  1. Paronychia of great toe of right foot     Plan: Patient's infection has resolved discussed that his nail should grow back in about 6 months.  Follow-Up Instructions: Return if symptoms worsen or fail to improve.   Ortho Exam  Patient is alert, oriented, no adenopathy, well-dressed, normal affect, normal respiratory effort. Examination there is no cellulitis no redness no drainage the paronychial infection has resolved.  Imaging: No results found. No images are attached to the encounter.  Labs: Lab Results  Component Value Date   HGBA1C 6.6 (H) 09/02/2017   HGBA1C  10/17/2007    6.0 (NOTE)   The ADA recommends the following therapeutic goals for glycemic   control related to Hgb A1C measurement:   Goal of Therapy:   < 7.0% Hgb A1C   Action Suggested:  > 8.0% Hgb A1C   Ref:  Diabetes Care, 22, Suppl. 1, 1999   REPTSTATUS 07/22/2009 FINAL 07/21/2009   CULT NO GROWTH 07/21/2009     Lab Results  Component Value Date   ALBUMIN 3.9 10/17/2013   ALBUMIN 2.7 (L) 07/23/2009   ALBUMIN 2.8 (L) 07/18/2009   PREALBUMIN 9.5 (L) 07/20/2009    Lab Results  Component Value Date   MG 2.4 10/20/2013   No results found for: [facility]  Lab Results  Component Value Date   PREALBUMIN 9.5 (L) 07/20/2009   CBC EXTENDED Latest Ref Rng & Units 08/31/2017 10/21/2013 10/20/2013  WBC 4.0 - 10.5  K/uL 5.2 5.8 6.9  RBC 4.22 - 5.81 MIL/uL 4.62 4.42 4.67  HGB 13.0 - 17.0 g/dL 95.6 38.7 56.4  HCT 33.2 - 52.0 % 41.4 41.0 43.2  PLT 150 - 400 K/uL 207 222 220  NEUTROABS 1.7 - 7.7 K/uL - - -  LYMPHSABS 0.7 - 4.0 K/uL - - -     There is no height or weight on file to calculate BMI.  Orders:  No orders of the defined types were placed in this encounter.  No orders of the defined types were placed in this encounter.    Procedures: No procedures performed  Clinical Data: No additional findings.  ROS:  All other systems negative, except as noted in the HPI. Review of Systems  Objective: Vital Signs: There were no vitals taken for this visit.  Specialty Comments:  No specialty comments available.  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   Bilateral carpal tunnel syndrome 06/10/2017   Essential hypertension 02/25/2016   Hyperlipidemia 02/25/2016   Shortness of breath 02/25/2016  Appendicitis 10/17/2013   Past Medical History:  Diagnosis Date   Arthritis    Asthma    Bronchitis    CAD in native artery 04/18/2021   Carpal tunnel syndrome, bilateral    Chronic diastolic heart failure (HCC) 03/07/2020   Coronary artery disease    50-70% LAD by 2001 cath   Essential hypertension 02/25/2016   GERD (gastroesophageal reflux disease)    Hypercholesteremia    Hyperlipidemia 02/25/2016   Hypertension    Obesity (BMI 30-39.9) 02/25/2016   OSA (obstructive sleep apnea) 04/18/2021   Pre-diabetes    Shortness of breath 02/25/2016   Sleep apnea    does not use CPAP   Stroke (HCC) ~2010   mini stroke    Stroke [facility]) 04/18/2021    Family History  Problem Relation Age of Onset   Heart disease Father    Stroke Father    Cancer Brother        colon   Other Son         Paraplegic from motorcycle accident   HIV/AIDS Daughter 25    Past Surgical History:  Procedure Laterality Date   CARDIAC CATHETERIZATION  2001   CARPAL TUNNEL RELEASE Right 07/06/2018   Procedure: RIGHT CARPAL TUNNEL RELEASE, EXTENDED EXCISION;  Surgeon: Cindee Salt, MD;  Location: [facility];  Service: Orthopedics;  Laterality: Right;   CARPAL TUNNEL RELEASE Left 04/12/2019   Procedure: CARPAL TUNNEL RELEASE;  Surgeon: Cindee Salt, MD;  Location: [facility];  Service: Orthopedics;  Laterality: Left;   COLONOSCOPY WITH PROPOFOL N/A 05/21/2015   Procedure: COLONOSCOPY WITH PROPOFOL;  Surgeon: [name], MD;  Location: [facility];  Service: Endoscopy;  Laterality: N/A;   EYE SURGERY     bilateral cataracts   FRACTURE SURGERY  1966   compound arm   HERNIA REPAIR     LAPAROSCOPIC APPENDECTOMY N/A 10/18/2013   Procedure: APPENDECTOMY LAPAROSCOPIC;  Surgeon: [name], MD;  Location: [facility];  Service: General;  Laterality: N/A;   SUBMANDIBULAR GLAND EXCISION Left 09/02/2017    Left submandibular gland and associated soft tissue and lymph nodes   SUBMANDIBULAR GLAND EXCISION Left 09/02/2017   Procedure: EXCISION LEFT SUBMANDIBULAR GLAND;  Surgeon: Serena Colonel, MD;  Location: [facility];  Service: ENT;  Laterality: Left;   TESTICLE REMOVAL  1965   Social History   Occupational History   Occupation: retired  Tobacco Use   Smoking status: Former    Packs/day: 0.25    Years: 0.50    Pack years: 0.13    Types: Cigarettes    Quit date: 10/27/1960    Years since quitting: 60.8   Smokeless tobacco: Never  Vaping Use   Vaping Use: Never used  Substance and Sexual Activity   Alcohol use: No   Drug use: No   Sexual activity: Not on file

## 2021-08-27 ENCOUNTER — Telehealth: Payer: Self-pay | Admitting: Orthopaedic Surgery

## 2021-08-27 ENCOUNTER — Other Ambulatory Visit: Payer: Self-pay | Admitting: Orthopaedic Surgery

## 2021-08-27 MED ORDER — TRAMADOL HCL 50 MG PO TABS: 50.0000 mg | ORAL_TABLET | Freq: Four times a day (QID) | ORAL | 0 refills | Status: DC | PRN

## 2021-08-27 NOTE — Telephone Encounter (Signed)
Pt called stating he got a a cortisone inj on 08/06/21 but it's worn off and he's experiencing some pain again. He would like to know if it would be possible for him to get another inj? Pt would lie a CB.  [phone]

## 2021-08-28 NOTE — Telephone Encounter (Signed)
Pt aware Rx sent to his pharmacy

## 2021-08-29 ENCOUNTER — Encounter: Payer: Self-pay | Admitting: Physical Therapy

## 2021-08-29 ENCOUNTER — Ambulatory Visit: Payer: Medicare PPO | Attending: Physician Assistant | Admitting: Physical Therapy

## 2021-08-29 ENCOUNTER — Other Ambulatory Visit: Payer: Self-pay

## 2021-08-29 DIAGNOSIS — M545 Low back pain, unspecified: Secondary | ICD-10-CM | POA: Insufficient documentation

## 2021-08-29 DIAGNOSIS — M25652 Stiffness of left hip, not elsewhere classified: Secondary | ICD-10-CM | POA: Diagnosis not present

## 2021-08-29 DIAGNOSIS — R262 Difficulty in walking, not elsewhere classified: Secondary | ICD-10-CM

## 2021-08-29 DIAGNOSIS — M5442 Lumbago with sciatica, left side: Secondary | ICD-10-CM

## 2021-08-29 DIAGNOSIS — M62838 Other muscle spasm: Secondary | ICD-10-CM | POA: Diagnosis not present

## 2021-08-29 DIAGNOSIS — M25552 Pain in left hip: Secondary | ICD-10-CM

## 2021-08-29 NOTE — Patient Instructions (Signed)
Access Code: [email or web address] URL: [email or web address] Date: 08/29/2021 Prepared by: Harrie Foreman  Exercises Seated Hamstring Stretch - 2 x daily - 7 x weekly - 1 sets - 3 reps - 15 hold Clamshell - 1 x daily - 7 x weekly - 2 sets - 10 reps

## 2021-08-29 NOTE — Therapy (Signed)
[facility] [address]  [address] [place], [place], [address] Phone: [phone]   Fax:  [phone]  Physical Therapy Evaluation  Patient Details  Name: Jeff Watkins MRN: [id number] Date of Birth: 31-Jan-1936 Referring Provider (PT): Richardean Canal PA-C   Encounter Date: 08/29/2021   PT End of Session - 08/29/21 1714     Visit Number 1    Number of Visits 12    Date for PT Re-Evaluation 10/10/21    Authorization Type [employer]    Progress Note Due on Visit 10    PT Start Time 1620    PT Stop Time 1705    PT Time Calculation (min) 45 min    Activity Tolerance Patient tolerated treatment well;Patient limited by pain    Behavior During Therapy [facility] for tasks assessed/performed             Past Medical History:  Diagnosis Date   Arthritis    Asthma    Bronchitis    CAD in native artery 04/18/2021   Carpal tunnel syndrome, bilateral    Chronic diastolic heart failure (HCC) 03/07/2020   Coronary artery disease    50-70% LAD by 2001 cath   Essential hypertension 02/25/2016   GERD (gastroesophageal reflux disease)    Hypercholesteremia    Hyperlipidemia 02/25/2016   Hypertension    Obesity (BMI 30-39.9) 02/25/2016   OSA (obstructive sleep apnea) 04/18/2021   Pre-diabetes    Shortness of breath 02/25/2016   Sleep apnea    does not use CPAP   Stroke (HCC) ~2010   mini stroke    Stroke (HCC) 04/18/2021    Past Surgical History:  Procedure Laterality Date   CARDIAC CATHETERIZATION  2001   CARPAL TUNNEL RELEASE Right 07/06/2018   Procedure: RIGHT CARPAL TUNNEL RELEASE, EXTENDED EXCISION;  Surgeon: Cindee Salt, MD;  Location: [facility];  Service: Orthopedics;  Laterality: Right;   CARPAL TUNNEL RELEASE Left 04/12/2019   Procedure: CARPAL TUNNEL RELEASE;  Surgeon: Cindee Salt, MD;  Location: [facility];  Service: Orthopedics;  Laterality: Left;   COLONOSCOPY WITH PROPOFOL N/A 05/21/2015    Procedure: COLONOSCOPY WITH PROPOFOL;  Surgeon: [name], MD;  Location: [facility];  Service: Endoscopy;  Laterality: N/A;   EYE SURGERY     bilateral cataracts   FRACTURE SURGERY  1966   compound arm   HERNIA REPAIR     LAPAROSCOPIC APPENDECTOMY N/A 10/18/2013   Procedure: APPENDECTOMY LAPAROSCOPIC;  Surgeon: [name], MD;  Location: [facility];  Service: General;  Laterality: N/A;   SUBMANDIBULAR GLAND EXCISION Left 09/02/2017    Left submandibular gland and associated soft tissue and lymph nodes   SUBMANDIBULAR GLAND EXCISION Left 09/02/2017   Procedure: EXCISION LEFT SUBMANDIBULAR GLAND;  Surgeon: Serena Colonel, MD;  Location: [facility] OR;  Service: ENT;  Laterality: Left;   TESTICLE REMOVAL  1965    There were no vitals filed for this visit.    Subjective Assessment - 08/29/21 1624     Subjective Patient reports he has L hip pain, not back pain.  He reports he does not have heart failure, checks BP daily , was 130/60 this morning, but has very slow heart rate 42.  He started using a walker over the last month because of his balance.  His pain is only when he is standing and walking. first noted pain 6-7 weeks ago, before his wife died, and progressed from there.  Pertinent History history of HTN, Diastolic HF, CVA, CAD, OSA, SOB    Limitations Walking;Standing;House hold activities    How long can you sit comfortably? no limitation    How long can you stand comfortably? stand as long as don't put too much weight on R leg    How long can you walk comfortably? pain starts immediatly when walking    Diagnostic tests X-rays on 08/06/21: AP pelvis: Bilateral hips well located.  No acute fractures.  Moderate   narrowing left hip joint.  Superior osteophytes off the acetabulum   bilaterally.  No other bony abnormalities.  2 views lumbar spine.  Minimal degenerative changes.  Overall disc space   well-maintained.  No acute fractures no spondylolisthesis.  No bony   abnormalities  otherwise.  Arthrosclerosis aorta noted.    Patient Stated Goals get rid of hip pain and walk better    Currently in Pain? Yes    Pain Score 7     Pain Location Hip    Pain Orientation Left    Pain Descriptors / Indicators Aching    Pain Type Acute pain    Pain Radiating Towards L buttock to hip    Pain Onset More than a month ago   6-7 weeks   Pain Frequency Intermittent    Aggravating Factors  standing and walking    Pain Relieving Factors sitting down    Effect of Pain on Daily Activities has to use 4WRW now                [facility] PT Assessment - 08/29/21 0001       Assessment   Medical Diagnosis M54.5 chronic LBP without sciatica    Referring Provider (PT) Richardean Canal PA-C    Onset Date/Surgical Date --   beginning of September 2022   Next MD Visit 09/04/2021    Prior Therapy no      Precautions   Precautions None      Restrictions   Weight Bearing Restrictions No      Balance Screen   Has the patient fallen in the past 6 months No    Has the patient had a decrease in activity level because of a fear of falling?  Yes    Is the patient reluctant to leave their home because of a fear of falling?  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   Leisure travel      Cognition   Overall Cognitive Status Within Functional Limits for tasks assessed      Observation/Other Assessments   Observations enters very slowly, antalgic gait, decreased stance time on L, using 4WRW    Focus on Therapeutic Outcomes (FOTO)  hip 50   household ambulator     Posture/Postural Control   Posture/Postural Control Postural  limitations    Postural Limitations Rounded Shoulders;Forward head;Increased thoracic kyphosis;Decreased lumbar lordosis;Weight shift right      ROM / Strength   AROM / PROM / Strength Strength;PROM  PROM   Overall PROM  Deficits;Due to pain    Overall PROM Comments tested in supine, very painful gaurded movements    PROM Assessment Site Hip    Right/Left Hip Left    Left Hip Flexion 90    Left Hip External Rotation  --   ~15 deg very painful   Left Hip Internal Rotation  0   very painful     Strength   Overall Strength Within functional limits for tasks performed    Overall Strength Comments 4+/5 bil hip flexors, all other 5/5 bil LE, no pain with resistance      Flexibility   Soft Tissue Assessment /Muscle Length yes    Hamstrings very tight, unable to extend knee fully in sitting    Piriformis tight      Palpation   Palpation comment tenderness to palpation L piriformis.  No tenderness over greater trochanter.  Unable to position in prone today due to limited mobility      Special Tests   Other special tests SLR negative bil, hip scour negative      Transfers   Five time sit to stand comments  22 seconds without UE support, increased L hip pain                        Objective measurements completed on examination: See above findings.       [facility] Adult PT Treatment/Exercise - 08/29/21 0001       Knee/Hip Exercises: Stretches   Active Hamstring Stretch Both;2 reps;10 seconds    Active Hamstring Stretch Limitations in sitting, difficulty with return demo      Knee/Hip Exercises: Sidelying   Clams x 10 LLE only                     PT Education - 08/29/21 1713     Education Details education on findings, POC, initial HEP, recommendations for use of CP to affected area.    Person(s) Educated Patient    Methods Explanation;Demonstration;Handout    Comprehension Verbalized understanding;Returned demonstration;Need further instruction               PT Short Term Goals - 08/29/21 1739       PT SHORT TERM GOAL #1   Title Ind with initial HEP    Time 2    Period Weeks    Status New    Target Date 09/12/21               PT Long Term Goals - 08/29/21 1739       PT LONG TERM GOAL #1   Title Ind with progressed HEP to improve outcomes    Time 6    Period Weeks    Status New    Target Date 10/10/21      PT LONG TERM GOAL #2   Title Pt. will report 75% improvement in L hip/buttock pain.    Time 6    Period Weeks    Status New    Target Date 10/10/21      PT LONG TERM GOAL #3   Title Patient will be able to walk 900' without increased L hip pain to access community.    Baseline immediate pain with walking, needs 4WRW    Time 6    Period Weeks    Status New    Target Date 10/10/21      PT LONG TERM GOAL #4  Title Pt. will demonstrate improved functional LE strength by completing 5x STS <17 seconds.    Baseline 22 seconds, L hip pain    Time 6    Period Weeks    Status New    Target Date 10/10/21      PT LONG TERM GOAL #5   Title Patient will be able to ambulate safely without AD.    Baseline using 4WRW, very unsteady, antalgic gait.    Time 6    Period Weeks    Status New    Target Date 10/10/21                    Plan - 08/29/21 1715     Clinical Impression Statement Mr. Chirstopher Iovino was referred for low back pain without sciatica, reports pain is primarily in L hip when walking.  He demonstrated signficant decreased L hip ROM today with pain all directions, pain over L piriformis, and decreased walking tolerance with antalgic gait requiring need for 4WRW.  He would benefit from skilled physical therapy to decrease pain, improve hip mobility, and improve overall mobility, balance and safety.    Personal Factors and Comorbidities Age;Comorbidity 3+    Comorbidities HTN, Diastolic HF, DOE, history CVA, CAD, OSA    Examination-Activity Limitations Bed Mobility;Carry;Locomotion  Level;Stairs;Stand;Bend    Examination-Participation Restrictions Cleaning;Community Activity    Stability/Clinical Decision Making Evolving/Moderate complexity    Clinical Decision Making Moderate    Rehab Potential Good    PT Frequency 2x / week    PT Duration 6 weeks    PT Treatment/Interventions ADLs/Self Care Home Management;Cryotherapy;Electrical Stimulation;Moist Heat;Ultrasound;Gait training;Stair training;Functional mobility training;Therapeutic activities;Therapeutic exercise;Balance training;Neuromuscular re-education;Dry needling;Manual techniques;Taping;Spinal Manipulations;Joint Manipulations    PT Next Visit Plan review and progress HEP, check supine/long sit test, manual therapy including DN as appropriate    Consulted and Agree with Plan of Care Patient             Patient will benefit from skilled therapeutic intervention in order to improve the following deficits and impairments:  Abnormal gait, Decreased activity tolerance, Decreased endurance, Decreased range of motion, Decreased strength, Hypomobility, Pain, Decreased balance, Decreased mobility, Decreased safety awareness, Difficulty walking, Increased muscle spasms, Impaired flexibility, Postural dysfunction  Visit Diagnosis: Pain in left hip  Stiffness of left hip, not elsewhere classified  Acute left-sided low back pain with left-sided sciatica  Difficulty in walking, not elsewhere classified  Other muscle spasm     Problem List Patient Active Problem List   Diagnosis Date Noted   OSA (obstructive sleep apnea) 04/18/2021   CAD in native artery 04/18/2021   Stroke (HCC) 04/18/2021   Chronic diastolic heart failure (HCC) 03/07/2020   Submandibular gland mass 09/02/2017   Bilateral carpal tunnel syndrome 06/10/2017   Essential hypertension 02/25/2016   Hyperlipidemia 02/25/2016   Shortness of breath 02/25/2016   Appendicitis 10/17/2013    Jena Gauss, PT, DPT 08/29/2021, 5:43 PM  [facility] [address]  [address] [place], [place], [address] Phone: [phone]   Fax:  [phone]  Name: JAHAZIEL FRANCOIS MRN: [id number] Date of Birth: December 16, 1935

## 2021-09-03 ENCOUNTER — Ambulatory Visit: Payer: Medicare PPO

## 2021-09-03 ENCOUNTER — Other Ambulatory Visit: Payer: Self-pay

## 2021-09-03 DIAGNOSIS — M25552 Pain in left hip: Secondary | ICD-10-CM

## 2021-09-03 DIAGNOSIS — M545 Low back pain, unspecified: Secondary | ICD-10-CM | POA: Diagnosis not present

## 2021-09-03 DIAGNOSIS — R262 Difficulty in walking, not elsewhere classified: Secondary | ICD-10-CM

## 2021-09-03 DIAGNOSIS — M62838 Other muscle spasm: Secondary | ICD-10-CM | POA: Diagnosis not present

## 2021-09-03 DIAGNOSIS — M5442 Lumbago with sciatica, left side: Secondary | ICD-10-CM

## 2021-09-03 DIAGNOSIS — M25652 Stiffness of left hip, not elsewhere classified: Secondary | ICD-10-CM | POA: Diagnosis not present

## 2021-09-03 NOTE — Therapy (Signed)
[facility] [address]  [address] [place], [place], [address] Phone: [phone]   Fax:  [phone]  Physical Therapy Treatment  Patient Details  Name: Jeff Watkins MRN: [id number] Date of Birth: 1936/01/17 Referring Provider (PT): Richardean Canal PA-C   Encounter Date: 09/03/2021   PT End of Session - 09/03/21 1100     Visit Number 2    Number of Visits 12    Date for PT Re-Evaluation 10/10/21    Authorization Type [employer]    Authorization Time Period 13 visits approved from 11.3.22 - 12.15.22    Authorization - Visit Number 2    Authorization - Number of Visits 13    Progress Note Due on Visit 10    PT Start Time 1018    PT Stop Time 1100    PT Time Calculation (min) 42 min    Activity Tolerance Patient tolerated treatment well;Patient limited by pain    Behavior During Therapy [facility] for tasks assessed/performed             Past Medical History:  Diagnosis Date   Arthritis    Asthma    Bronchitis    CAD in native artery 04/18/2021   Carpal tunnel syndrome, bilateral    Chronic diastolic heart failure (HCC) 03/07/2020   Coronary artery disease    50-70% LAD by 2001 cath   Essential hypertension 02/25/2016   GERD (gastroesophageal reflux disease)    Hypercholesteremia    Hyperlipidemia 02/25/2016   Hypertension    Obesity (BMI 30-39.9) 02/25/2016   OSA (obstructive sleep apnea) 04/18/2021   Pre-diabetes    Shortness of breath 02/25/2016   Sleep apnea    does not use CPAP   Stroke (HCC) ~2010   mini stroke    Stroke (HCC) 04/18/2021    Past Surgical History:  Procedure Laterality Date   CARDIAC CATHETERIZATION  2001   CARPAL TUNNEL RELEASE Right 07/06/2018   Procedure: RIGHT CARPAL TUNNEL RELEASE, EXTENDED EXCISION;  Surgeon: Cindee Salt, MD;  Location: [facility];  Service: Orthopedics;  Laterality: Right;   CARPAL TUNNEL RELEASE Left 04/12/2019   Procedure: CARPAL TUNNEL RELEASE;  Surgeon:  Cindee Salt, MD;  Location: [facility];  Service: Orthopedics;  Laterality: Left;   COLONOSCOPY WITH PROPOFOL N/A 05/21/2015   Procedure: COLONOSCOPY WITH PROPOFOL;  Surgeon: [name], MD;  [facility]: [facility];  Service: Endoscopy;  Laterality: N/A;   EYE SURGERY     bilateral cataracts   FRACTURE SURGERY  1966   compound arm   HERNIA REPAIR     LAPAROSCOPIC APPENDECTOMY N/A 10/18/2013   Procedure: APPENDECTOMY LAPAROSCOPIC;  Surgeon: [name], MD;  Location: [facility];  Service: General;  Laterality: N/A;   SUBMANDIBULAR GLAND EXCISION Left 09/02/2017    Left submandibular gland and associated soft tissue and lymph nodes   SUBMANDIBULAR GLAND EXCISION Left 09/02/2017   Procedure: EXCISION LEFT SUBMANDIBULAR GLAND;  Surgeon: Serena Colonel, MD;  Location: [facility] OR;  Service: ENT;  Laterality: Left;   TESTICLE REMOVAL  1965    There were no vitals filed for this visit.   Subjective Assessment - 09/03/21 1021     Subjective Pt reports L hip pain, the laying down exercises works well for me.    Pertinent History history of HTN, Diastolic HF, CVA, CAD, OSA, SOB    Diagnostic tests X-rays on 08/06/21: AP pelvis: Bilateral hips well located.  No acute fractures.  Moderate  narrowing left hip joint.  Superior osteophytes off the acetabulum   bilaterally.  No other bony abnormalities.  2 views lumbar spine.  Minimal degenerative changes.  Overall disc space   well-maintained.  No acute fractures no spondylolisthesis.  No bony   abnormalities otherwise.  Arthrosclerosis aorta noted.    Patient Stated Goals get rid of hip pain and walk better    Currently in Pain? Yes    Pain Score 5     Pain Location Hip    Pain Orientation Left    Pain Descriptors / Indicators Aching    Pain Type Acute pain                               [facility] Adult PT Treatment/Exercise - 09/03/21 0001       Exercises   Exercises Knee/Hip      Knee/Hip Exercises: Stretches    Active Hamstring Stretch 2 reps;30 seconds;Both    Active Hamstring Stretch Limitations seated    Piriformis Stretch Limitations painful in supine position    Other Knee/Hip Stretches LTR 10x    Other Knee/Hip Stretches tried pelvic tilts but difficulty with the correct form      Knee/Hip Exercises: Aerobic   Nustep L3x83min      Knee/Hip Exercises: Supine   Heel Slides AROM;Both;10 reps    Other Supine Knee/Hip Exercises clams 20 reps    Other Supine Knee/Hip Exercises marches with TrA brace 10x; ball squeezes 10x2"                       PT Short Term Goals - 09/03/21 1102       PT SHORT TERM GOAL #1   Title Ind with initial HEP    Time 2    Period Weeks    Status On-going    Target Date 09/12/21               PT Long Term Goals - 09/03/21 1102       PT LONG TERM GOAL #1   Title Ind with progressed HEP to improve outcomes    Time 6    Period Weeks    Status On-going      PT LONG TERM GOAL #2   Title Pt. will report 75% improvement in L hip/buttock pain.    Time 6    Period Weeks    Status On-going      PT LONG TERM GOAL #3   Title Patient will be able to walk 900' without increased L hip pain to access community.    Baseline immediate pain with walking, needs 4WRW    Time 6    Period Weeks    Status On-going      PT LONG TERM GOAL #4   Title Pt. will demonstrate improved functional LE strength by completing 5x STS <17 seconds.    Baseline 22 seconds, L hip pain    Time 6    Period Weeks    Status On-going      PT LONG TERM GOAL #5   Title Patient will be able to ambulate safely without AD.    Baseline using 4WRW, very unsteady, antalgic gait.    Time 6    Period Weeks    Status On-going                   Plan - 09/03/21 1111  Clinical Impression Statement Pt had a good response to the treatment overall. He noted mild pain on the R hip with the LTRs and clams but with cues to decrease ROM pain subsided. Tried to do  pelvic tilts but he had a lot of confusion on the motion even with TC and demo, so we deferred. Better tolerance shown for seated HS stretch but still tight B. Bilat piriformis stretches caused some pain for him and very tight, may try next time for longer duration. He declined an HEP update today.    Personal Factors and Comorbidities Age;Comorbidity 3+    Comorbidities HTN, Diastolic HF, DOE, history CVA, CAD, OSA    PT Frequency 2x / week    PT Duration 6 weeks    PT Treatment/Interventions ADLs/Self Care Home Management;Cryotherapy;Electrical Stimulation;Moist Heat;Ultrasound;Gait training;Stair training;Functional mobility training;Therapeutic activities;Therapeutic exercise;Balance training;Neuromuscular re-education;Dry needling;Manual techniques;Taping;Spinal Manipulations;Joint Manipulations    PT Next Visit Plan check supine/long sit test, manual therapy including DN as appropriate    Consulted and Agree with Plan of Care Patient             Patient will benefit from skilled therapeutic intervention in order to improve the following deficits and impairments:  Abnormal gait, Decreased activity tolerance, Decreased endurance, Decreased range of motion, Decreased strength, Hypomobility, Pain, Decreased balance, Decreased mobility, Decreased safety awareness, Difficulty walking, Increased muscle spasms, Impaired flexibility, Postural dysfunction  Visit Diagnosis: Pain in left hip  Stiffness of left hip, not elsewhere classified  Acute left-sided low back pain with left-sided sciatica  Difficulty in walking, not elsewhere classified  Other muscle spasm     Problem List Patient Active Problem List   Diagnosis Date Noted   OSA (obstructive sleep apnea) 04/18/2021   CAD in native artery 04/18/2021   Stroke (HCC) 04/18/2021   Chronic diastolic heart failure (HCC) 03/07/2020   Submandibular gland mass 09/02/2017   Bilateral carpal tunnel syndrome 06/10/2017   Essential  hypertension 02/25/2016   Hyperlipidemia 02/25/2016   Shortness of breath 02/25/2016   Appendicitis 10/17/2013    Darleene Cleaver, PTA 09/03/2021, 11:57 AM  [facility] [address]  [address] [place], [place], [address] Phone: [phone]   Fax:  [phone]  Name: Jeff Watkins MRN: [id number] Date of Birth: [birth year]

## 2021-09-04 ENCOUNTER — Encounter: Payer: Self-pay | Admitting: Physician Assistant

## 2021-09-04 ENCOUNTER — Ambulatory Visit: Payer: Medicare PPO | Admitting: Physician Assistant

## 2021-09-04 DIAGNOSIS — M545 Low back pain, unspecified: Secondary | ICD-10-CM

## 2021-09-04 DIAGNOSIS — M25552 Pain in left hip: Secondary | ICD-10-CM

## 2021-09-04 NOTE — Progress Notes (Signed)
HPI: Mr. Jeff Watkins returns today for follow-up of his low back pain and left hip pain.  He states that the trochanteric injection left hip helped for about a week and gave some relief during that time.  Now his pain is back to what it was prior to the injection.  He points to his left buttocks region and this is the source of his pain is denies any groin pain.  He does note that physical therapy has helped with his back and is currently having no back pain or radicular symptoms down either leg.  Review of systems see HPI otherwise negative or noncontributory.   Physical exam: General well-developed well-nourished male who ambulates without any assistive device with an antalgic slightly shuffling gait. Left hip he has limited internal rotation with slight discomfort.  External rotation is full without pain.  Impression: Low back pain improved Left hip pain/moderate osteoarthritis  Plan: He will continue therapy for his back.  Discussed with him that this his pain may be coming more so from the arthritis in the hip joint itself and would like to try an injection deep in the hip.  We will send him for an intra-articular injection in the left hip having follow-up in just 2 weeks after the injection to see what type of response he had.  Questions were encouraged and answered at length.

## 2021-09-04 NOTE — Addendum Note (Signed)
Addended by: Barbette Or on: 09/04/2021 02:39 PM   Modules accepted: Orders

## 2021-09-06 ENCOUNTER — Ambulatory Visit: Payer: Medicare PPO | Admitting: Physical Therapy

## 2021-09-06 ENCOUNTER — Other Ambulatory Visit: Payer: Self-pay

## 2021-09-06 ENCOUNTER — Encounter: Payer: Self-pay | Admitting: Physical Therapy

## 2021-09-06 DIAGNOSIS — M25552 Pain in left hip: Secondary | ICD-10-CM

## 2021-09-06 DIAGNOSIS — M5442 Lumbago with sciatica, left side: Secondary | ICD-10-CM

## 2021-09-06 DIAGNOSIS — M62838 Other muscle spasm: Secondary | ICD-10-CM

## 2021-09-06 DIAGNOSIS — M545 Low back pain, unspecified: Secondary | ICD-10-CM | POA: Diagnosis not present

## 2021-09-06 DIAGNOSIS — M25652 Stiffness of left hip, not elsewhere classified: Secondary | ICD-10-CM | POA: Diagnosis not present

## 2021-09-06 DIAGNOSIS — R262 Difficulty in walking, not elsewhere classified: Secondary | ICD-10-CM | POA: Diagnosis not present

## 2021-09-06 NOTE — Therapy (Signed)
[facility] Outpatient Rehabilitation [facility] [address]  [address] [place], [place], [address] Phone: [phone]   Fax:  [phone]  Physical Therapy Treatment  Patient Details  Name: Jeff Watkins MRN: [id number] Date of Birth: [birth year] Referring Provider (PT): Richardean Canal PA-C   Encounter Date: 09/06/2021   PT End of Session - 09/06/21 1054     Visit Number 3    Number of Visits 12    Date for PT Re-Evaluation 10/10/21    Authorization Type [employer]    Authorization Time Period 13 visits approved from 11.3.22 - 12.15.22    Authorization - Visit Number 2    Authorization - Number of Visits 13    Progress Note Due on Visit 10    PT Start Time 0845    PT Stop Time 0930    PT Time Calculation (min) 45 min    Activity Tolerance Patient tolerated treatment well;Patient limited by pain    Behavior During Therapy [facility] for tasks assessed/performed             Past Medical History:  Diagnosis Date   Arthritis    Asthma    Bronchitis    CAD in native artery 04/18/2021   Carpal tunnel syndrome, bilateral    Chronic diastolic heart failure (HCC) 03/07/2020   Coronary artery disease    50-70% LAD by 2001 cath   Essential hypertension 02/25/2016   GERD (gastroesophageal reflux disease)    Hypercholesteremia    Hyperlipidemia 02/25/2016   Hypertension    Obesity (BMI 30-39.9) 02/25/2016   OSA (obstructive sleep apnea) 04/18/2021   Pre-diabetes    Shortness of breath 02/25/2016   Sleep apnea    does not use CPAP   Stroke (HCC) ~2010   mini stroke    Stroke (HCC) 04/18/2021    Past Surgical History:  Procedure Laterality Date   CARDIAC CATHETERIZATION  2001   CARPAL TUNNEL RELEASE Right 07/06/2018   Procedure: RIGHT CARPAL TUNNEL RELEASE, EXTENDED EXCISION;  Surgeon: Cindee Salt, MD;  Location: [facility];  Service: Orthopedics;  Laterality: Right;   CARPAL TUNNEL RELEASE Left 04/12/2019   Procedure: CARPAL TUNNEL RELEASE;  Surgeon:  Cindee Salt, MD;  Location: [facility];  Service: Orthopedics;  Laterality: Left;   COLONOSCOPY WITH PROPOFOL N/A 05/21/2015   Procedure: COLONOSCOPY WITH PROPOFOL;  Surgeon: [name], MD;  Location: [facility];  Service: Endoscopy;  Laterality: N/A;   EYE SURGERY     bilateral cataracts   FRACTURE SURGERY  1966   compound arm   HERNIA REPAIR     LAPAROSCOPIC APPENDECTOMY N/A 10/18/2013   Procedure: APPENDECTOMY LAPAROSCOPIC;  Surgeon: [name], MD;  Location: [facility];  Service: General;  Laterality: N/A;   SUBMANDIBULAR GLAND EXCISION Left 09/02/2017    Left submandibular gland and associated soft tissue and lymph nodes   SUBMANDIBULAR GLAND EXCISION Left 09/02/2017   Procedure: EXCISION LEFT SUBMANDIBULAR GLAND;  Surgeon: Serena Colonel, MD;  Location: [facility];  Service: ENT;  Laterality: Left;   TESTICLE REMOVAL  1965    There were no vitals filed for this visit.   Subjective Assessment - 09/06/21 0933     Subjective Pt reports L hip pain, the laying down exercises works well for me.    Pertinent History history of HTN, Diastolic HF, CVA, CAD, OSA, SOB    Diagnostic tests X-rays on 08/06/21: AP pelvis: Bilateral hips well located.  No acute fractures.  Moderate  narrowing left hip joint.  Superior osteophytes off the acetabulum   bilaterally.  No other bony abnormalities.  2 views lumbar spine.  Minimal degenerative changes.  Overall disc space   well-maintained.  No acute fractures no spondylolisthesis.  No bony   abnormalities otherwise.  Arthrosclerosis aorta noted.    Patient Stated Goals get rid of hip pain and walk better    Currently in Pain? Yes    Pain Score 6     Pain Location Hip    Pain Orientation Left                               [facility] Adult PT Treatment/Exercise - 09/06/21 0001       Exercises   Exercises Knee/Hip      Knee/Hip Exercises: Stretches   Active Hamstring Stretch 2 reps;30 seconds;Both    Active  Hamstring Stretch Limitations seated    Piriformis Stretch Both;2 reps;20 seconds    Piriformis Stretch Limitations seated, pushing down on knee to gap, tolerated well      Knee/Hip Exercises: Aerobic   Recumbent Bike L1 x 3 min      Knee/Hip Exercises: Supine   Bridges Strengthening;Both;2 sets;10 reps    Other Supine Knee/Hip Exercises clams x 20, LTR x 20,      Manual Therapy   Manual Therapy Soft tissue mobilization;Manual Traction    Manual therapy comments to LLE to decrease pain and spasm    Soft tissue mobilization IASTM with foam roller to L glutes and TFL, TPR to L piriformis and glut med    Manual Traction long leg distraction of GH joint 3 x 30 sec pulls bil (hip in flexion, abduction and ER)                     PT Education - 09/06/21 1054     Education Details Access Code: [email or web address]  HEP update    Person(s) Educated Patient    Methods Explanation;Demonstration;Verbal cues;Handout    Comprehension Verbalized understanding;Returned demonstration              PT Short Term Goals - 09/03/21 1102       PT SHORT TERM GOAL #1   Title Ind with initial HEP    Time 2    Period Weeks    Status On-going    Target Date 09/12/21               PT Long Term Goals - 09/03/21 1102       PT LONG TERM GOAL #1   Title Ind with progressed HEP to improve outcomes    Time 6    Period Weeks    Status On-going      PT LONG TERM GOAL #2   Title Pt. will report 75% improvement in L hip/buttock pain.    Time 6    Period Weeks    Status On-going      PT LONG TERM GOAL #3   Title Patient will be able to walk 900' without increased L hip pain to access community.    Baseline immediate pain with walking, needs 4WRW    Time 6    Period Weeks    Status On-going      PT LONG TERM GOAL #4   Title Pt. will demonstrate improved functional LE strength by completing 5x STS <17 seconds.    Baseline 22 seconds, L hip pain  Time 6    Period Weeks    Status  On-going      PT LONG TERM GOAL #5   Title Patient will be able to ambulate safely without AD.    Baseline using 4WRW, very unsteady, antalgic gait.    Time 6    Period Weeks    Status On-going                   Plan - 09/06/21 1055     Clinical Impression Statement Pt reports continued L hip pain but was able to participate today, although noted DOE.  He was able to tolerated seated piriformis stretch without increased discomfort, HS stretch is also improvement,  In supine had no pain with LTR, bent knee falls outs, and bridges.   Reported some relief with distraction of GH joints and manual therapy.  Updated HEP.  He would benefit from continued skilled physical therapy.    Personal Factors and Comorbidities Age;Comorbidity 3+    Comorbidities HTN, Diastolic HF, DOE, history CVA, CAD, OSA    PT Frequency 2x / week    PT Duration 6 weeks    PT Treatment/Interventions ADLs/Self Care Home Management;Cryotherapy;Electrical Stimulation;Moist Heat;Ultrasound;Gait training;Stair training;Functional mobility training;Therapeutic activities;Therapeutic exercise;Balance training;Neuromuscular re-education;Dry needling;Manual techniques;Taping;Spinal Manipulations;Joint Manipulations    PT Next Visit Plan check supine/long sit test, manual therapy including DN as appropriate    Consulted and Agree with Plan of Care Patient             Patient will benefit from skilled therapeutic intervention in order to improve the following deficits and impairments:  Abnormal gait, Decreased activity tolerance, Decreased endurance, Decreased range of motion, Decreased strength, Hypomobility, Pain, Decreased balance, Decreased mobility, Decreased safety awareness, Difficulty walking, Increased muscle spasms, Impaired flexibility, Postural dysfunction  Visit Diagnosis: Pain in left hip  Stiffness of left hip, not elsewhere classified  Acute left-sided low back pain with left-sided  sciatica  Difficulty in walking, not elsewhere classified  Other muscle spasm     Problem List Patient Active Problem List   Diagnosis Date Noted   OSA (obstructive sleep apnea) 04/18/2021   CAD in native artery 04/18/2021   Stroke (HCC) 04/18/2021   Chronic diastolic heart failure (HCC) 03/07/2020   Submandibular gland mass 09/02/2017   Bilateral carpal tunnel syndrome 06/10/2017   Essential hypertension 02/25/2016   Hyperlipidemia 02/25/2016   Shortness of breath 02/25/2016   Appendicitis 10/17/2013    Jena Gauss, PT, DPT 09/06/2021, 11:00 AM  [facility] [address]  [address] [place], [place], [address] Phone: [phone]   Fax:  [phone]  Name: MERT DIETRICK MRN: [id number] Date of Birth: 1936/04/16

## 2021-09-06 NOTE — Patient Instructions (Signed)
Access Code: [email or web address] URL: [email or web address] Date: 09/06/2021 Prepared by: Harrie Foreman  Exercises Seated Figure 4 Piriformis Stretch - 1 x daily - 7 x weekly - 1 sets - 3 reps - 15 sec hold Supine Bridge - 1 x daily - 7 x weekly - 2 sets - 10 reps Bent Knee Fallouts - 1 x daily - 7 x weekly - 2 sets - 10 reps

## 2021-09-09 ENCOUNTER — Telehealth: Payer: Self-pay

## 2021-09-09 NOTE — Telephone Encounter (Signed)
Pt called again

## 2021-09-09 NOTE — Telephone Encounter (Signed)
Called patient to advise that we do not have any more openings on 11/16. He will keep current appointment time.

## 2021-09-09 NOTE — Telephone Encounter (Signed)
Pt called and would like to know if he can move his appt time around.  Please advise

## 2021-09-10 ENCOUNTER — Ambulatory Visit: Payer: Medicare PPO | Admitting: Physical Therapy

## 2021-09-10 ENCOUNTER — Other Ambulatory Visit: Payer: Self-pay

## 2021-09-10 DIAGNOSIS — M25552 Pain in left hip: Secondary | ICD-10-CM

## 2021-09-10 DIAGNOSIS — M5442 Lumbago with sciatica, left side: Secondary | ICD-10-CM | POA: Diagnosis not present

## 2021-09-10 DIAGNOSIS — M25652 Stiffness of left hip, not elsewhere classified: Secondary | ICD-10-CM

## 2021-09-10 DIAGNOSIS — R262 Difficulty in walking, not elsewhere classified: Secondary | ICD-10-CM | POA: Diagnosis not present

## 2021-09-10 DIAGNOSIS — M62838 Other muscle spasm: Secondary | ICD-10-CM | POA: Diagnosis not present

## 2021-09-10 DIAGNOSIS — M545 Low back pain, unspecified: Secondary | ICD-10-CM | POA: Diagnosis not present

## 2021-09-10 NOTE — Therapy (Signed)
[facility] Outpatient Rehabilitation [facility] [address]  [address] [place], [place], [address] Phone: [phone]   Fax:  [phone]  Physical Therapy Treatment  Patient Details  Name: Jeff Watkins MRN: [id number] Date of Birth: 01/18/1936 Referring Provider (PT): Richardean Canal PA-C   Encounter Date: 09/10/2021   PT End of Session - 09/10/21 1022     Visit Number 4    Number of Visits 12    Date for PT Re-Evaluation 10/10/21    Authorization Type [employer]    Authorization Time Period 13 visits approved from 11.3.22 - 12.15.22    Authorization - Visit Number 2    Authorization - Number of Visits 13    Progress Note Due on Visit 10    PT Start Time 1017    PT Stop Time 1102    PT Time Calculation (min) 45 min    Activity Tolerance Patient tolerated treatment well;Patient limited by pain    Behavior During Therapy [facility] for tasks assessed/performed             Past Medical History:  Diagnosis Date   Arthritis    Asthma    Bronchitis    CAD in native artery 04/18/2021   Carpal tunnel syndrome, bilateral    Chronic diastolic heart failure (HCC) 03/07/2020   Coronary artery disease    50-70% LAD by 2001 cath   Essential hypertension 02/25/2016   GERD (gastroesophageal reflux disease)    Hypercholesteremia    Hyperlipidemia 02/25/2016   Hypertension    Obesity (BMI 30-39.9) 02/25/2016   OSA (obstructive sleep apnea) 04/18/2021   Pre-diabetes    Shortness of breath 02/25/2016   Sleep apnea    does not use CPAP   Stroke (HCC) ~2010   mini stroke    Stroke (HCC) 04/18/2021    Past Surgical History:  Procedure Laterality Date   CARDIAC CATHETERIZATION  2001   CARPAL TUNNEL RELEASE Right 07/06/2018   Procedure: RIGHT CARPAL TUNNEL RELEASE, EXTENDED EXCISION;  Surgeon: Cindee Salt, MD;  Location: [facility];  Service: Orthopedics;  Laterality: Right;   CARPAL TUNNEL RELEASE Left 04/12/2019   Procedure: CARPAL TUNNEL RELEASE;  Surgeon:  Cindee Salt, MD;  Location: [facility];  Service: Orthopedics;  Laterality: Left;   COLONOSCOPY WITH PROPOFOL N/A 05/21/2015   Procedure: COLONOSCOPY WITH PROPOFOL;  Surgeon: [name], MD;  Location[facility];  Service: Endoscopy;  Laterality: N/A;   EYE SURGERY     bilateral cataracts   FRACTURE SURGERY  1966   compound arm   HERNIA REPAIR     LAPAROSCOPIC APPENDECTOMY N/A 10/18/2013   Procedure: APPENDECTOMY LAPAROSCOPIC;  Surgeon: [name], MD;  Location: [facility];  Service: General;  Laterality: N/A;   SUBMANDIBULAR GLAND EXCISION Left 09/02/2017    Left submandibular gland and associated soft tissue and lymph nodes   SUBMANDIBULAR GLAND EXCISION Left 09/02/2017   Procedure: EXCISION LEFT SUBMANDIBULAR GLAND;  Surgeon: Serena Colonel, MD;  Location: [facility];  Service: ENT;  Laterality: Left;   TESTICLE REMOVAL  1965    There were no vitals filed for this visit.   Subjective Assessment - 09/10/21 1021     Subjective Patient reports the R hip hurts as much as the L hip does now.  Goes tomorrow for shots.    Pertinent History history of HTN, Diastolic HF, CVA, CAD, OSA, SOB    Diagnostic tests X-rays on 08/06/21: AP pelvis: Bilateral hips well located.  No acute fractures.  Moderate   narrowing left hip joint.  Superior osteophytes off the acetabulum   bilaterally.  No other bony abnormalities.  2 views lumbar spine.  Minimal degenerative changes.  Overall disc space   well-maintained.  No acute fractures no spondylolisthesis.  No bony   abnormalities otherwise.  Arthrosclerosis aorta noted.    Patient Stated Goals get rid of hip pain and walk better    Currently in Pain? Yes    Pain Score 5     Pain Location Hip    Pain Orientation Right;Left                               [facility] Adult PT Treatment/Exercise - 09/10/21 0001       Exercises   Exercises Knee/Hip      Knee/Hip Exercises: Stretches   Piriformis Stretch Both;2 reps;20  seconds    Piriformis Stretch Limitations seated, pushing down on knee to gap, tolerated well      Knee/Hip Exercises: Aerobic   Nustep L5 2 x 3 min   rest break between due to dyspnea, cues for pursed lip breathing     Knee/Hip Exercises: Standing   Functional Squat 10 reps    Functional Squat Limitations UE support on chair, tactile cues for form      Knee/Hip Exercises: Seated   Ball Squeeze 2 x 10    Clamshell with TheraBand Green    Knee/Hip Flexion SLR 2 x 10 bil    Marching Strengthening;Both;2 sets;10 reps;Limitations    Marching Limitations GTB      Manual Therapy   Manual Therapy Joint mobilization    Manual therapy comments to bil hips to decrease pain    Joint Mobilization GH distraction bil hips using belt 10 x 5 sec hold, then long leg distraction with hip in ER 10 x 5 sec hold.  Reported decreased pain.                     PT Education - 09/10/21 1207     Education Details HEP update for sitting exercises    Person(s) Educated Patient    Methods Explanation;Demonstration;Handout;Verbal cues    Comprehension Verbalized understanding;Returned demonstration              PT Short Term Goals - 09/03/21 1102       PT SHORT TERM GOAL #1   Title Ind with initial HEP    Time 2    Period Weeks    Status On-going    Target Date 09/12/21               PT Long Term Goals - 09/03/21 1102       PT LONG TERM GOAL #1   Title Ind with progressed HEP to improve outcomes    Time 6    Period Weeks    Status On-going      PT LONG TERM GOAL #2   Title Pt. will report 75% improvement in L hip/buttock pain.    Time 6    Period Weeks    Status On-going      PT LONG TERM GOAL #3   Title Patient will be able to walk 900' without increased L hip pain to access community.    Baseline immediate pain with walking, needs 4WRW    Time 6    Period Weeks    Status On-going  PT LONG TERM GOAL #4   Title Pt. will demonstrate improved functional LE  strength by completing 5x STS <17 seconds.    Baseline 22 seconds, L hip pain    Time 6    Period Weeks    Status On-going      PT LONG TERM GOAL #5   Title Patient will be able to ambulate safely without AD.    Baseline using 4WRW, very unsteady, antalgic gait.    Time 6    Period Weeks    Status On-going                   Plan - 09/10/21 1210     Clinical Impression Statement Patient reports continued hip pain today, bilaterally, but was able to perform seated exercises without increased pain.  He responded well to [facility] distraction.  He has injection scheduled for tomorrow and hopefully with pain managed he will able to participate in more standing exercises for strengthening.  He would benefit from continued skilled therapy.    Personal Factors and Comorbidities Age;Comorbidity 3+    Comorbidities HTN, Diastolic HF, DOE, history CVA, CAD, OSA    PT Frequency 2x / week    PT Duration 6 weeks    PT Treatment/Interventions ADLs/Self Care Home Management;Cryotherapy;Electrical Stimulation;Moist Heat;Ultrasound;Gait training;Stair training;Functional mobility training;Therapeutic activities;Therapeutic exercise;Balance training;Neuromuscular re-education;Dry needling;Manual techniques;Taping;Spinal Manipulations;Joint Manipulations    PT Next Visit Plan check supine/long sit test, manual therapy including DN as appropriate    Consulted and Agree with Plan of Care Patient             Patient will benefit from skilled therapeutic intervention in order to improve the following deficits and impairments:  Abnormal gait, Decreased activity tolerance, Decreased endurance, Decreased range of motion, Decreased strength, Hypomobility, Pain, Decreased balance, Decreased mobility, Decreased safety awareness, Difficulty walking, Increased muscle spasms, Impaired flexibility, Postural dysfunction  Visit Diagnosis: Pain in left hip  Stiffness of left hip, not elsewhere classified  Acute  left-sided low back pain with left-sided sciatica  Difficulty in walking, not elsewhere classified  Other muscle spasm     Problem List Patient Active Problem List   Diagnosis Date Noted   OSA (obstructive sleep apnea) 04/18/2021   CAD in native artery 04/18/2021   Stroke (HCC) 04/18/2021   Chronic diastolic heart failure (HCC) 03/07/2020   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  [address] [place], [place], [address] Phone: [phone]   Fax:  [phone]  Name: RYLEIGH BUENGER MRN: [id number] Date of Birth: February 07, 1936

## 2021-09-10 NOTE — Patient Instructions (Signed)
Access Code: [email or web address] URL: [email or web address] Date: 09/10/2021 Prepared by: Harrie Foreman  Exercises Seated Isometric Hip Adduction with Newman Pies - 1 x daily - 7 x weekly - 2 sets - 10 reps Seated Hip Abduction with Resistance - 1 x daily - 7 x weekly - 2 sets - 10 reps Seated March with Resistance - 1 x daily - 7 x weekly - 2 sets - 10 reps Squat with Chair and Counter Support - 1 x daily - 7 x weekly - 2 sets - 10 reps Seated Figure 4 Piriformis Stretch - 1 x daily - 7 x weekly - 1 sets - 3 reps

## 2021-09-11 ENCOUNTER — Ambulatory Visit: Payer: Self-pay

## 2021-09-11 ENCOUNTER — Ambulatory Visit: Payer: Medicare PPO | Admitting: Physical Medicine and Rehabilitation

## 2021-09-11 ENCOUNTER — Encounter: Payer: Self-pay | Admitting: Physical Medicine and Rehabilitation

## 2021-09-11 DIAGNOSIS — M25551 Pain in right hip: Secondary | ICD-10-CM

## 2021-09-11 DIAGNOSIS — M25552 Pain in left hip: Secondary | ICD-10-CM | POA: Diagnosis not present

## 2021-09-11 MED ORDER — BUPIVACAINE HCL 0.5 % IJ SOLN: 3.0000 mL | INTRAMUSCULAR | Status: DC | PRN

## 2021-09-11 MED ORDER — BUPIVACAINE HCL 0.25 % IJ SOLN: 5.0000 mL | INTRAMUSCULAR | Status: DC | PRN

## 2021-09-11 MED ORDER — TRIAMCINOLONE ACETONIDE 40 MG/ML IJ SUSP: 40.0000 mg | INTRAMUSCULAR | Status: DC | PRN

## 2021-09-11 NOTE — Progress Notes (Addendum)
   Jeff Watkins - 85 y.o. male MRN [id number]  Date of birth: May 03, 1936  Office Visit Note: Visit Date: 09/11/2021 PCP: Lorenda Ishihara, MD Referred by: Lorenda Ishihara,*  Subjective: Chief Complaint  Patient presents with   Right Hip - Pain   Left Hip - Pain   HPI:  Jeff Watkins is a 85 y.o. male who comes in today at the request of Rexene Edison, PA-C for planned Bilateral anesthetic hip arthrogram with fluoroscopic guidance.  The patient has failed conservative care including home exercise, medications, time and activity modification.  This injection will be diagnostic and hopefully therapeutic.  Please see requesting physician notes for further details and justification.   ROS Otherwise per HPI.  Assessment & Plan: Visit Diagnoses:    ICD-10-CM   1. Pain in left hip  M25.552 XR C-ARM NO REPORT    Large Joint Inj: bilateral hip joint    DISCONTINUED: bupivacaine (MARCAINE) 0.25 % (with pres) injection 5 mL    DISCONTINUED: bupivacaine (MARCAINE) 0.5 % (with pres) injection 3 mL    DISCONTINUED: triamcinolone acetonide (KENALOG-40) injection 40 mg      Plan: No additional findings.   Meds & Orders:  Meds ordered this encounter  Medications   DISCONTD: bupivacaine (MARCAINE) 0.25 % (with pres) injection 5 mL   DISCONTD: bupivacaine (MARCAINE) 0.5 % (with pres) injection 3 mL   DISCONTD: triamcinolone acetonide (KENALOG-40) injection 40 mg    Orders Placed This Encounter  Procedures   Large Joint Inj: bilateral hip joint   XR C-ARM NO REPORT    Follow-up: No follow-ups on file.   Procedures: Large Joint Inj: bilateral hip joint on 09/11/2021 9:43 AM Indications: pain and diagnostic evaluation Details: 22 G 3.5 in needle, fluoroscopy-guided anteromedial approach  Arthrogram: No  Outcome: tolerated well, no immediate complications  There was excellent flow of contrast producing a partial arthrogram of the glenohumeral joint. The patient did have relief of  symptoms during the anesthetic phase of the injection. Procedure, treatment alternatives, risks and benefits explained, specific risks discussed. Consent was given by the patient. Immediately prior to procedure a time out was called to verify the correct patient, procedure, equipment, support staff and site/side marked as required. Patient was prepped and draped in the usual sterile fashion.         Clinical History: No specialty comments available.     Objective:  VS:  HT:    WT:   BMI:     BP:   HR: bpm  TEMP: ( )  RESP:  Physical Exam   Imaging: No results found.

## 2021-09-11 NOTE — Progress Notes (Signed)
Bilat Pt state left and right hip pain. Pt state walking makes the pain worse. Pt state he takes pain meds to help ease his pain.  Numeric Pain Rating Scale and Functional Assessment Average Pain 7   In the last MONTH (on 0-10 scale) has pain interfered with the following?  1. General activity like being  able to carry out your everyday physical activities such as walking, climbing stairs, carrying groceries, or moving a chair?  Rating(10)   -BT, -Dye Allergies.

## 2021-09-12 ENCOUNTER — Other Ambulatory Visit: Payer: Self-pay | Admitting: Orthopaedic Surgery

## 2021-09-13 ENCOUNTER — Ambulatory Visit: Payer: Medicare PPO

## 2021-09-17 ENCOUNTER — Ambulatory Visit: Payer: Medicare PPO

## 2021-09-17 ENCOUNTER — Other Ambulatory Visit: Payer: Self-pay

## 2021-09-17 DIAGNOSIS — M25652 Stiffness of left hip, not elsewhere classified: Secondary | ICD-10-CM | POA: Diagnosis not present

## 2021-09-17 DIAGNOSIS — R262 Difficulty in walking, not elsewhere classified: Secondary | ICD-10-CM | POA: Diagnosis not present

## 2021-09-17 DIAGNOSIS — M545 Low back pain, unspecified: Secondary | ICD-10-CM | POA: Diagnosis not present

## 2021-09-17 DIAGNOSIS — M62838 Other muscle spasm: Secondary | ICD-10-CM | POA: Diagnosis not present

## 2021-09-17 DIAGNOSIS — M5442 Lumbago with sciatica, left side: Secondary | ICD-10-CM | POA: Diagnosis not present

## 2021-09-17 DIAGNOSIS — M25552 Pain in left hip: Secondary | ICD-10-CM

## 2021-09-17 NOTE — Therapy (Signed)
[facility] [address]  [address] [place], [place], [address] Phone: [phone]   Fax:  [phone]  Physical Therapy Treatment  Patient Details  Name: Jeff Watkins MRN: [id number] Date of Birth: 1936-03-31 Referring Provider (PT): Richardean Canal PA-C   Encounter Date: 09/17/2021   PT End of Session - 09/17/21 1143     Visit Number 5    Number of Visits 12    Date for PT Re-Evaluation 10/10/21    Authorization Type [employer]    Authorization Time Period 13 visits approved from 11.3.22 - 12.15.22    Authorization - Visit Number 3    Authorization - Number of Visits 13    Progress Note Due on Visit 10    PT Start Time 1017    PT Stop Time 1101    PT Time Calculation (min) 44 min    Activity Tolerance Patient tolerated treatment well    Behavior During Therapy [facility] for tasks assessed/performed             Past Medical History:  Diagnosis Date   Arthritis    Asthma    Bronchitis    CAD in native artery 04/18/2021   Carpal tunnel syndrome, bilateral    Chronic diastolic heart failure (HCC) 03/07/2020   Coronary artery disease    50-70% LAD by 2001 cath   Essential hypertension 02/25/2016   GERD (gastroesophageal reflux disease)    Hypercholesteremia    Hyperlipidemia 02/25/2016   Hypertension    Obesity (BMI 30-39.9) 02/25/2016   OSA (obstructive sleep apnea) 04/18/2021   Pre-diabetes    Shortness of breath 02/25/2016   Sleep apnea    does not use CPAP   Stroke (HCC) ~2010   mini stroke    Stroke (HCC) 04/18/2021    Past Surgical History:  Procedure Laterality Date   CARDIAC CATHETERIZATION  2001   CARPAL TUNNEL RELEASE Right 07/06/2018   Procedure: RIGHT CARPAL TUNNEL RELEASE, EXTENDED EXCISION;  Surgeon: Cindee Salt, MD;  Location: [facility];  Service: Orthopedics;  Laterality: Right;   CARPAL TUNNEL RELEASE Left 04/12/2019   Procedure: CARPAL TUNNEL RELEASE;  Surgeon: Cindee Salt, MD;  Location:  [facility];  Service: Orthopedics;  Laterality: Left;   COLONOSCOPY WITH PROPOFOL N/A 05/21/2015   Procedure: COLONOSCOPY WITH PROPOFOL;  Surgeon: [name], MD;  Location: [facility];  Service: Endoscopy;  Laterality: N/A;   EYE SURGERY     bilateral cataracts   FRACTURE SURGERY  1966   compound arm   HERNIA REPAIR     LAPAROSCOPIC APPENDECTOMY N/A 10/18/2013   Procedure: APPENDECTOMY LAPAROSCOPIC;  Surgeon: [name], MD;  Location: [facility];  Service: General;  Laterality: N/A;   SUBMANDIBULAR GLAND EXCISION Left 09/02/2017    Left submandibular gland and associated soft tissue and lymph nodes   SUBMANDIBULAR GLAND EXCISION Left 09/02/2017   Procedure: EXCISION LEFT SUBMANDIBULAR GLAND;  Surgeon: Serena Colonel, MD;  Location: [facility] OR;  Service: ENT;  Laterality: Left;   TESTICLE REMOVAL  1965    There were no vitals filed for this visit.   Subjective Assessment - 09/17/21 1023     Subjective Pt reports that ever since his shots he has been doing good.    Pertinent History history of HTN, Diastolic HF, CVA, CAD, OSA, SOB    Diagnostic tests X-rays on 08/06/21: AP pelvis: Bilateral hips well located.  No acute fractures.  Moderate   narrowing left  hip joint.  Superior osteophytes off the acetabulum   bilaterally.  No other bony abnormalities.  2 views lumbar spine.  Minimal degenerative changes.  Overall disc space   well-maintained.  No acute fractures no spondylolisthesis.  No bony   abnormalities otherwise.  Arthrosclerosis aorta noted.    Patient Stated Goals get rid of hip pain and walk better    Currently in Pain? Yes    Pain Score 3     Pain Location Hip    Pain Orientation Right;Left    Pain Descriptors / Indicators Aching    Pain Type Acute pain                               [facility] Adult PT Treatment/Exercise - 09/17/21 0001       Exercises   Exercises Knee/Hip      Knee/Hip Exercises: Stretches   Active Hamstring Stretch  Both;2 reps;30 seconds    Active Hamstring Stretch Limitations seated with LE supported on mat, very tight on the L    Piriformis Stretch Both;30 seconds    Piriformis Stretch Limitations seated figure 4 with OP      Knee/Hip Exercises: Aerobic   Nustep L4x42min      Knee/Hip Exercises: Standing   Other Standing Knee Exercises deadlifts with 1 arm support 12 reps    Other Standing Knee Exercises marches with 1 arm support 12 reps      Knee/Hip Exercises: Seated   Long Arc Quad AROM;Both;10 reps    Clamshell with [employer]ra[employer] Green   2x10 with 3 sec hold   Hamstring Curl Strengthening;Both;10 reps    Hamstring Limitations green TB                       PT Short Term Goals - 09/17/21 1029       PT SHORT TERM GOAL #1   Title Ind with initial HEP    Time 2    Period Weeks    Status Achieved   09/17/21   Target Date 09/12/21               PT Long Term Goals - 09/03/21 1102       PT LONG TERM GOAL #1   Title Ind with progressed HEP to improve outcomes    Time 6    Period Weeks    Status On-going      PT LONG TERM GOAL #2   Title Pt. will report 75% improvement in L hip/buttock pain.    Time 6    Period Weeks    Status On-going      PT LONG TERM GOAL #3   Title Patient will be able to walk 900' without increased L hip pain to access community.    Baseline immediate pain with walking, needs 4WRW    Time 6    Period Weeks    Status On-going      PT LONG TERM GOAL #4   Title Pt. will demonstrate improved functional LE strength by completing 5x STS <17 seconds.    Baseline 22 seconds, L hip pain    Time 6    Period Weeks    Status On-going      PT LONG TERM GOAL #5   Title Patient will be able to ambulate safely without AD.    Baseline using 4WRW, very unsteady, antalgic gait.    Time 6  Period Weeks    Status On-going                   Plan - 09/17/21 1106     Clinical Impression Statement Pt well tolerated the interventions today  with no increased pain. B hamstrings are still considerably tight. Cues needed to keep nuetral spine with the deadlift exercise and for eccentric control with standing marches. He noted good benefit from his injections recieved last week and reported less pain today. Finished session with no complaints and notes good compliance with HEP.    Personal Factors and Comorbidities Age;Comorbidity 3+    Comorbidities HTN, Diastolic HF, DOE, history CVA, CAD, OSA    PT Frequency 2x / week    PT Duration 6 weeks    PT Treatment/Interventions ADLs/Self Care Home Management;Cryotherapy;Electrical Stimulation;Moist Heat;Ultrasound;Gait training;Stair training;Functional mobility training;Therapeutic activities;Therapeutic exercise;Balance training;Neuromuscular re-education;Dry needling;Manual techniques;Taping;Spinal Manipulations;Joint Manipulations    PT Next Visit Plan check supine/long sit test, manual therapy including DN as appropriate    Consulted and Agree with Plan of Care Patient             Patient will benefit from skilled therapeutic intervention in order to improve the following deficits and impairments:  Abnormal gait, Decreased activity tolerance, Decreased endurance, Decreased range of motion, Decreased strength, Hypomobility, Pain, Decreased balance, Decreased mobility, Decreased safety awareness, Difficulty walking, Increased muscle spasms, Impaired flexibility, Postural dysfunction  Visit Diagnosis: Pain in left hip  Stiffness of left hip, not elsewhere classified  Acute left-sided low back pain with left-sided sciatica  Difficulty in walking, not elsewhere classified  Other muscle spasm     Problem List Patient Active Problem List   Diagnosis Date Noted   OSA (obstructive sleep apnea) 04/18/2021   CAD in native artery 04/18/2021   Stroke (HCC) 04/18/2021   Chronic diastolic heart failure (HCC) 03/07/2020   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

## 2021-09-24 ENCOUNTER — Other Ambulatory Visit: Payer: Self-pay

## 2021-09-24 ENCOUNTER — Ambulatory Visit: Payer: Medicare PPO | Admitting: Physical Therapy

## 2021-09-24 ENCOUNTER — Encounter: Payer: Self-pay | Admitting: Physical Therapy

## 2021-09-24 DIAGNOSIS — M25652 Stiffness of left hip, not elsewhere classified: Secondary | ICD-10-CM

## 2021-09-24 DIAGNOSIS — R262 Difficulty in walking, not elsewhere classified: Secondary | ICD-10-CM

## 2021-09-24 DIAGNOSIS — M5442 Lumbago with sciatica, left side: Secondary | ICD-10-CM | POA: Diagnosis not present

## 2021-09-24 DIAGNOSIS — M62838 Other muscle spasm: Secondary | ICD-10-CM

## 2021-09-24 DIAGNOSIS — M25552 Pain in left hip: Secondary | ICD-10-CM

## 2021-09-24 DIAGNOSIS — M545 Low back pain, unspecified: Secondary | ICD-10-CM | POA: Diagnosis not present

## 2021-09-24 NOTE — Therapy (Signed)
[facility] [address]  [place], [place], [address] Phone: [phone]   Fax:  [phone]  Physical Therapy Treatment  Patient Details  Name: Jeff Watkins MRN: [id number] Date of Birth: 1936-08-08 Referring Provider (PT): Erskine Emery PA-C   Encounter Date: 09/24/2021   PT End of Session - 09/24/21 1025     Visit Number 6    Number of Visits 12    Date for PT Re-Evaluation 10/10/21    Authorization Type [employer]    Authorization Time Period 13 visits approved from 11.3.22 - 12.15.22    Authorization - Visit Number 3    Authorization - Number of Visits 13    Progress Note Due on Visit 10    PT Start Time 1020    PT Stop Time 1100    PT Time Calculation (min) 40 min    Activity Tolerance Patient tolerated treatment well    Behavior During Therapy [facility] for tasks assessed/performed             Past Medical History:  Diagnosis Date   Arthritis    Asthma    Bronchitis    CAD in native artery 04/18/2021   Carpal tunnel syndrome, bilateral    Chronic diastolic heart failure ([place]) 03/07/2020   Coronary artery disease    50-70% LAD by 2001 cath   Essential hypertension 02/25/2016   GERD (gastroesophageal reflux disease)    Hypercholesteremia    Hyperlipidemia 02/25/2016   Hypertension    Obesity (BMI 30-39.9) 02/25/2016   OSA (obstructive sleep apnea) 04/18/2021   Pre-diabetes    Shortness of breath 02/25/2016   Sleep apnea    does not use CPAP   Stroke ([place]) ~2010   mini stroke    Stroke ([place]) 04/18/2021    Past Surgical History:  Procedure Laterality Date   CARDIAC CATHETERIZATION  2001   CARPAL TUNNEL RELEASE Right 07/06/2018   Procedure: RIGHT CARPAL TUNNEL RELEASE, EXTENDED EXCISION;  Surgeon: Daryll Brod, MD;  Location: [place];  Service: Orthopedics;  Laterality: Right;   CARPAL TUNNEL RELEASE Left 04/12/2019   Procedure: CARPAL TUNNEL RELEASE;  Surgeon: Daryll Brod, MD;  Location:  [place];  Service: Orthopedics;  Laterality: Left;   COLONOSCOPY WITH PROPOFOL N/A 05/21/2015   Procedure: COLONOSCOPY WITH PROPOFOL;  Surgeon: Garlan Fair, MD;  Location: [facility];  Service: Endoscopy;  Laterality: N/A;   EYE SURGERY     bilateral cataracts   FRACTURE SURGERY  1966   compound arm   HERNIA REPAIR     LAPAROSCOPIC APPENDECTOMY N/A 10/18/2013   Procedure: APPENDECTOMY LAPAROSCOPIC;  Surgeon: Gwenyth Ober, MD;  Location: [place];  Service: General;  Laterality: N/A;   SUBMANDIBULAR GLAND EXCISION Left 09/02/2017    Left submandibular gland and associated soft tissue and lymph nodes   SUBMANDIBULAR GLAND EXCISION Left 09/02/2017   Procedure: EXCISION LEFT SUBMANDIBULAR GLAND;  Surgeon: Izora Gala, MD;  Location: [place];  Service: ENT;  Laterality: Left;   [place]    There were no vitals filed for this visit.   Subjective Assessment - 09/24/21 1022     Subjective Patient reports that the shots are wearing off and still has to take tramadol in morning and tylenol at night, still using walking, but overall reports 50% improvement in L hip pain.  R hip now bothering him more than L hip.    Pertinent History history of HTN,  Diastolic HF, CVA, CAD, OSA, SOB    Diagnostic tests X-rays on 08/06/21: AP pelvis: Bilateral hips well located.  No acute fractures.  Moderate   narrowing left hip joint.  Superior osteophytes off the acetabulum   bilaterally.  No other bony abnormalities.  2 views lumbar spine.  Minimal degenerative changes.  Overall disc space   well-maintained.  No acute fractures no spondylolisthesis.  No bony   abnormalities otherwise.  Arthrosclerosis aorta noted.    Patient Stated Goals get rid of hip pain and walk better    Currently in Pain? Yes    Pain Score 4     Pain Location Hip    Pain Orientation Right;Left   more in R than Left now   Pain Descriptors / Indicators Aching                                [facility] Adult PT Treatment/Exercise - 09/24/21 0001       Exercises   Exercises Knee/Hip      Knee/Hip Exercises: Aerobic   Nustep L5x82min   no hip pain, but SOB after     Knee/Hip Exercises: Standing   Heel Raises Both;2 sets;10 reps    Heel Raises Limitations UE support on window sill, cuess for eccentric control    Knee Flexion Strengthening;Both;1 set;10 reps    Knee Flexion Limitations UE support on counter    Hip Flexion Stengthening;Both;10 reps    Hip Flexion Limitations UE support on counter, VC    Hip Abduction Stengthening;Both;10 reps;Knee straight    Abduction Limitations UE support on counter, demo and VC    Hip Extension Stengthening;Both;10 reps;Knee straight    Extension Limitations UE support, demo and VC needed      Knee/Hip Exercises: Seated   Sit to [employer] 5 reps      Manual Therapy   Manual Therapy Soft tissue mobilization;Myofascial release    Manual therapy comments to bil hips to decrease pain, in prone    Soft tissue mobilization IASTM with foam roller to bil glutes, lumbar paraspinals    Myofascial Release TPR to bil glutes/piriformis                     PT Education - 09/24/21 1044     Education Details Access Code: [email or web address] progressed HEP with standing exercises.    Person(s) Educated Patient    Methods Explanation;Demonstration;Verbal cues;Handout    Comprehension Verbalized understanding;Returned demonstration              PT Short Term Goals - 09/17/21 1029       PT SHORT TERM GOAL #1   Title Ind with initial HEP    Time 2    Period Weeks    Status Achieved   09/17/21   Target Date 09/12/21               PT Long Term Goals - 09/24/21 1025       PT LONG TERM GOAL #1   Title Ind with progressed HEP to improve outcomes    Time 6    Period Weeks    Status On-going   09/24/21- met for current   Target Date 10/10/21      PT LONG TERM GOAL #2   Title Pt. will report 75%  improvement in L hip/buttock pain.    Time 6    Period Weeks    Status On-going  09/24/21- progress, reports 50% improvement in L hip pain.     PT LONG TERM GOAL #3   Title Patient will be able to walk 900' without increased L hip pain to access community.    Baseline immediate pain with walking, needs 4WRW    Time 6    Period Weeks    Status On-going   09/24/21- still reports pain with walking     PT LONG TERM GOAL #4   Title Pt. will demonstrate improved functional LE strength by completing 5x STS <17 seconds.    Baseline 22 seconds, L hip pain    Time 6    Period Weeks    Status Achieved   09/24/2021- 11 seconds, no hip pain     PT LONG TERM GOAL #5   Title Patient will be able to ambulate safely without AD.    Baseline using 4WRW, very unsteady, antalgic gait.    Time 6    Period Weeks    Status On-going   09/24/21- still using RW for community ambulation, trendelenburg gait                  Plan - 09/24/21 1238     Clinical Impression Statement Mr. Diana is making good progress, today demonstrated improved functional strength completing 5x STS in 11 sec, meeting LTG#4.  He reports 50% improvement overall in hip pain.  He tolerated progression of exercises to include standing exercises without report of increased pain, only limited today due to DOE.  He would benefit from continued skilled therapy.    Personal Factors and Comorbidities Age;Comorbidity 3+    Comorbidities HTN, Diastolic HF, DOE, history CVA, CAD, OSA    PT Frequency 2x / week    PT Duration 6 weeks    PT Treatment/Interventions ADLs/Self Care Home Management;Cryotherapy;Electrical Stimulation;Moist Heat;Ultrasound;Gait training;Stair training;Functional mobility training;Therapeutic activities;Therapeutic exercise;Balance training;Neuromuscular re-education;Dry needling;Manual techniques;Taping;Spinal Manipulations;Joint Manipulations    PT Next Visit Plan check supine/long sit test, manual therapy  including DN as appropriate    Consulted and Agree with Plan of Care Patient             Patient will benefit from skilled therapeutic intervention in order to improve the following deficits and impairments:  Abnormal gait, Decreased activity tolerance, Decreased endurance, Decreased range of motion, Decreased strength, Hypomobility, Pain, Decreased balance, Decreased mobility, Decreased safety awareness, Difficulty walking, Increased muscle spasms, Impaired flexibility, Postural dysfunction  Visit Diagnosis: Pain in left hip  Stiffness of left hip, not elsewhere classified  Acute left-sided low back pain with left-sided sciatica  Difficulty in walking, not elsewhere classified  Other muscle spasm     Problem List Patient Active Problem List   Diagnosis Date Noted   OSA (obstructive sleep apnea) 04/18/2021   CAD in native artery 04/18/2021   Stroke ([place]) 04/18/2021   Chronic diastolic heart failure ([place]) 03/07/2020   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

## 2021-09-24 NOTE — Patient Instructions (Signed)
Access Code: [email or web address] URL: [email or web address] Date: 09/24/2021 Prepared by: Harrie Foreman  Exercises Heel Raises with Counter Support - 2 x daily - 7 x weekly - 1 sets - 10 reps Standing Hip Abduction with Counter Support - 2 x daily - 7 x weekly - 1 sets - 10 reps Standing Hip Extension with Counter Support - 2 x daily - 7 x weekly - 1 sets - 10 reps Standing Hip Flexion with Counter Support - 2 x daily - 7 x weekly - 1 sets - 10 reps Standing Knee Flexion with Counter Support - 2 x daily - 7 x weekly - 1 sets - 10 reps

## 2021-09-26 ENCOUNTER — Ambulatory Visit: Payer: Medicare PPO | Attending: Physician Assistant

## 2021-09-26 ENCOUNTER — Other Ambulatory Visit: Payer: Self-pay

## 2021-09-26 DIAGNOSIS — M25552 Pain in left hip: Secondary | ICD-10-CM | POA: Insufficient documentation

## 2021-09-26 DIAGNOSIS — R262 Difficulty in walking, not elsewhere classified: Secondary | ICD-10-CM | POA: Diagnosis not present

## 2021-09-26 DIAGNOSIS — M25652 Stiffness of left hip, not elsewhere classified: Secondary | ICD-10-CM | POA: Diagnosis not present

## 2021-09-26 DIAGNOSIS — M62838 Other muscle spasm: Secondary | ICD-10-CM | POA: Diagnosis not present

## 2021-09-26 DIAGNOSIS — M5442 Lumbago with sciatica, left side: Secondary | ICD-10-CM | POA: Insufficient documentation

## 2021-09-26 NOTE — Therapy (Signed)
[facility] [address]  [place], [place], [address] Phone: [phone]   Fax:  [phone]  Physical Therapy Treatment  Patient Details  Name: Jeff Watkins MRN: [id number] Date of Birth: 13-Aug-1936 Referring Provider (PT): Erskine Emery PA-C   Encounter Date: 09/26/2021   PT End of Session - 09/26/21 1150     Visit Number 7    Number of Visits 12    Date for PT Re-Evaluation 10/10/21    Authorization Type [employer]    Authorization Time Period 13 visits approved from 11.3.22 - 12.15.22    Authorization - Visit Number 4    Authorization - Number of Visits 13    Progress Note Due on Visit 10    PT Start Time 1105    PT Stop Time 1146    PT Time Calculation (min) 41 min    Activity Tolerance Patient tolerated treatment well;Patient limited by fatigue    Behavior During Therapy [facility] for tasks assessed/performed             Past Medical History:  Diagnosis Date   Arthritis    Asthma    Bronchitis    CAD in native artery 04/18/2021   Carpal tunnel syndrome, bilateral    Chronic diastolic heart failure ([place]) 03/07/2020   Coronary artery disease    50-70% LAD by 2001 cath   Essential hypertension 02/25/2016   GERD (gastroesophageal reflux disease)    Hypercholesteremia    Hyperlipidemia 02/25/2016   Hypertension    Obesity (BMI 30-39.9) 02/25/2016   OSA (obstructive sleep apnea) 04/18/2021   Pre-diabetes    Shortness of breath 02/25/2016   Sleep apnea    does not use CPAP   Stroke ([place]) ~2010   mini stroke    Stroke ([place]) 04/18/2021    Past Surgical History:  Procedure Laterality Date   CARDIAC CATHETERIZATION  2001   CARPAL TUNNEL RELEASE Right 07/06/2018   Procedure: RIGHT CARPAL TUNNEL RELEASE, EXTENDED EXCISION;  Surgeon: Daryll Brod, MD;  Location: [place];  Service: Orthopedics;  Laterality: Right;   CARPAL TUNNEL RELEASE Left 04/12/2019   Procedure: CARPAL TUNNEL RELEASE;  Surgeon:  Daryll Brod, MD;  Location: [facility];  Service: Orthopedics;  Laterality: Left;   COLONOSCOPY WITH PROPOFOL N/A 05/21/2015   Procedure: COLONOSCOPY WITH PROPOFOL;  Surgeon: Garlan Fair, MD;  Location[facility];  Service: Endoscopy;  Laterality: N/A;   EYE SURGERY     bilateral cataracts   FRACTURE SURGERY  1966   compound arm   HERNIA REPAIR     LAPAROSCOPIC APPENDECTOMY N/A 10/18/2013   Procedure: APPENDECTOMY LAPAROSCOPIC;  Surgeon: Gwenyth Ober, MD;  Location: [place];  Service: General;  Laterality: N/A;   SUBMANDIBULAR GLAND EXCISION Left 09/02/2017    Left submandibular gland and associated soft tissue and lymph nodes   SUBMANDIBULAR GLAND EXCISION Left 09/02/2017   Procedure: EXCISION LEFT SUBMANDIBULAR GLAND;  Surgeon: Izora Gala, MD;  Location: [place];  Service: ENT;  Laterality: Left;   [place]    There were no vitals filed for this visit.   Subjective Assessment - 09/26/21 1108     Subjective Pt reports that his hips feel better this morning.    Pertinent History history of HTN, Diastolic HF, CVA, CAD, OSA, SOB    Diagnostic tests X-rays on 08/06/21: AP pelvis: Bilateral hips well located.  No acute fractures.  Moderate   narrowing left  hip joint.  Superior osteophytes off the acetabulum   bilaterally.  No other bony abnormalities.  2 views lumbar spine.  Minimal degenerative changes.  Overall disc space   well-maintained.  No acute fractures no spondylolisthesis.  No bony   abnormalities otherwise.  Arthrosclerosis aorta noted.    Patient Stated Goals get rid of hip pain and walk better    Currently in Pain? Yes    Pain Score 2     Pain Location Hip    Pain Orientation Right;Left    Pain Descriptors / Indicators Aching    Pain Type Chronic pain;Acute pain                               [facility] Adult PT Treatment/Exercise - 09/26/21 0001       Knee/Hip Exercises: Stretches   Piriformis Stretch Both;30 seconds     Piriformis Stretch Limitations seated figure 4 with OP      Knee/Hip Exercises: Aerobic   Nustep L4x7min   level dropped due to SOB     Knee/Hip Exercises: Machines for Strengthening   Cybex Knee Extension 15lb 10 reps    Cybex Knee Flexion 20lb 10 reps      Knee/Hip Exercises: Standing   Hip Flexion Stengthening;Both;10 reps;2 sets    Hip Flexion Limitations UE support on counter    Hip Abduction Stengthening;Both;10 reps;Knee straight;2 sets    Abduction Limitations UE support on counter   cues not to rotate hip or ER   Hip Extension Stengthening;Both;10 reps;Knee straight    Extension Limitations UE support    Forward Step Up Both;2 sets;10 reps;Hand Hold: 2;Step Height: 4"    Functional Squat 10 reps    Functional Squat Limitations counter support                       PT Short Term Goals - 09/17/21 1029       PT SHORT TERM GOAL #1   Title Ind with initial HEP    Time 2    Period Weeks    Status Achieved   09/17/21   Target Date 09/12/21               PT Long Term Goals - 09/24/21 1025       PT LONG TERM GOAL #1   Title Ind with progressed HEP to improve outcomes    Time 6    Period Weeks    Status On-going   09/24/21- met for current   Target Date 10/10/21      PT LONG TERM GOAL #2   Title Pt. will report 75% improvement in L hip/buttock pain.    Time 6    Period Weeks    Status On-going   09/24/21- progress, reports 50% improvement in L hip pain.     PT LONG TERM GOAL #3   Title Patient will be able to walk 900' without increased L hip pain to access community.    Baseline immediate pain with walking, needs 4WRW    Time 6    Period Weeks    Status On-going   09/24/21- still reports pain with walking     PT LONG TERM GOAL #4   Title Pt. will demonstrate improved functional LE strength by completing 5x STS <17 seconds.    Baseline 22 seconds, L hip pain    Time 6    Period Weeks  Status Achieved   09/24/2021- 11 seconds, no hip  pain     PT LONG TERM GOAL #5   Title Patient will be able to ambulate safely without AD.    Baseline using 4WRW, very unsteady, antalgic gait.    Time 6    Period Weeks    Status On-going   09/24/21- still using RW for community ambulation, trendelenburg gait                  Plan - 09/26/21 1151     Clinical Impression Statement Pt requires cues for slow and controlled movement during ther ex. He shows a tendency to become fatigued with standing exercises but did well with the progressed standing ther ex. He shows better ability to sit with his hips in FABER position sitting with no pain, which allows him to don/doff shoes easier. He continues to progress with less pain reported today and good progression with exercises.    Personal Factors and Comorbidities Age;Comorbidity 3+    Comorbidities HTN, Diastolic HF, DOE, history CVA, CAD, OSA    PT Frequency 2x / week    PT Duration 6 weeks    PT Treatment/Interventions ADLs/Self Care Home Management;Cryotherapy;Electrical Stimulation;Moist Heat;Ultrasound;Gait training;Stair training;Functional mobility training;Therapeutic activities;Therapeutic exercise;Balance training;Neuromuscular re-education;Dry needling;Manual techniques;Taping;Spinal Manipulations;Joint Manipulations    PT Next Visit Plan check supine/long sit test, standing ther ex, manual therapy including DN as appropriate    Consulted and Agree with Plan of Care Patient             Patient will benefit from skilled therapeutic intervention in order to improve the following deficits and impairments:  Abnormal gait, Decreased activity tolerance, Decreased endurance, Decreased range of motion, Decreased strength, Hypomobility, Pain, Decreased balance, Decreased mobility, Decreased safety awareness, Difficulty walking, Increased muscle spasms, Impaired flexibility, Postural dysfunction  Visit Diagnosis: Pain in left hip  Stiffness of left hip, not elsewhere  classified  Acute left-sided low back pain with left-sided sciatica  Difficulty in walking, not elsewhere classified  Other muscle spasm     Problem List Patient Active Problem List   Diagnosis Date Noted   OSA (obstructive sleep apnea) 04/18/2021   CAD in native artery 04/18/2021   Stroke ([place]) 04/18/2021   Chronic diastolic heart failure ([place]) 03/07/2020   Submandibular gland mass 09/02/2017   Bilateral carpal tunnel syndrome 06/10/2017   Essential hypertension 02/25/2016   Hyperlipidemia 02/25/2016   Shortness of breath 02/25/2016   Appendicitis 10/17/2013    Artist Pais, PTA 09/26/2021, 11:54 AM  [facility] [address]  [place], [place], [address] Phone: [phone]   Fax:  [phone]  Name: MICHELE KERLIN MRN: [id number] Date of Birth: February 22, 1936

## 2021-09-30 ENCOUNTER — Other Ambulatory Visit: Payer: Self-pay | Admitting: Cardiovascular Disease

## 2021-10-01 ENCOUNTER — Other Ambulatory Visit: Payer: Self-pay

## 2021-10-01 ENCOUNTER — Encounter: Payer: Self-pay | Admitting: Physical Therapy

## 2021-10-01 ENCOUNTER — Ambulatory Visit: Payer: Medicare PPO | Admitting: Physical Therapy

## 2021-10-01 DIAGNOSIS — R262 Difficulty in walking, not elsewhere classified: Secondary | ICD-10-CM

## 2021-10-01 DIAGNOSIS — M25552 Pain in left hip: Secondary | ICD-10-CM | POA: Diagnosis not present

## 2021-10-01 DIAGNOSIS — M62838 Other muscle spasm: Secondary | ICD-10-CM | POA: Diagnosis not present

## 2021-10-01 DIAGNOSIS — M25652 Stiffness of left hip, not elsewhere classified: Secondary | ICD-10-CM

## 2021-10-01 DIAGNOSIS — M5442 Lumbago with sciatica, left side: Secondary | ICD-10-CM | POA: Diagnosis not present

## 2021-10-01 NOTE — Therapy (Signed)
[facility] [address]  [place], [place], [address] Phone: [phone]   Fax:  [phone]  Physical Therapy Treatment  Patient Details  Name: Jeff Watkins MRN: [id number] Date of Birth: [birth year] Referring Provider (PT): Erskine Emery PA-C   Encounter Date: 10/01/2021   PT End of Session - 10/01/21 1022     Visit Number 8    Number of Visits 12    Date for PT Re-Evaluation 10/10/21    Authorization Type [employer]    Authorization Time Period 13 visits approved from 11.3.22 - 12.15.22    Authorization - Visit Number 4    Authorization - Number of Visits 13    Progress Note Due on Visit 10    PT Start Time 1018    PT Stop Time 1059    PT Time Calculation (min) 41 min    Activity Tolerance Patient tolerated treatment well    Behavior During Therapy [facility] for tasks assessed/performed             Past Medical History:  Diagnosis Date   Arthritis    Asthma    Bronchitis    CAD in native artery 04/18/2021   Carpal tunnel syndrome, bilateral    Chronic diastolic heart failure ([place]) 03/07/2020   Coronary artery disease    50-70% LAD by 2001 cath   Essential hypertension 02/25/2016   GERD (gastroesophageal reflux disease)    Hypercholesteremia    Hyperlipidemia 02/25/2016   Hypertension    Obesity (BMI 30-39.9) 02/25/2016   OSA (obstructive sleep apnea) 04/18/2021   Pre-diabetes    Shortness of breath 02/25/2016   Sleep apnea    does not use CPAP   Stroke ([place]) ~2010   mini stroke    Stroke ([place]) 04/18/2021    Past Surgical History:  Procedure Laterality Date   CARDIAC CATHETERIZATION  2001   CARPAL TUNNEL RELEASE Right 07/06/2018   Procedure: RIGHT CARPAL TUNNEL RELEASE, EXTENDED EXCISION;  Surgeon: Daryll Brod, MD;  Location: [place];  Service: Orthopedics;  Laterality: Right;   CARPAL TUNNEL RELEASE Left 04/12/2019   Procedure: CARPAL TUNNEL RELEASE;  Surgeon: Daryll Brod, MD;  Location:  [place];  Service: Orthopedics;  Laterality: Left;   COLONOSCOPY WITH PROPOFOL N/A 05/21/2015   Procedure: COLONOSCOPY WITH PROPOFOL;  Surgeon: Garlan Fair, MD;  [facility];  Service: Endoscopy;  Laterality: N/A;   EYE SURGERY     bilateral cataracts   FRACTURE SURGERY  1966   compound arm   HERNIA REPAIR     LAPAROSCOPIC APPENDECTOMY N/A 10/18/2013   Procedure: APPENDECTOMY LAPAROSCOPIC;  Surgeon: Gwenyth Ober, MD;  Location: [facility] [facility];  Service: General;  Laterality: N/A;   SUBMANDIBULAR GLAND EXCISION Left 09/02/2017    Left submandibular gland and associated soft tissue and lymph nodes   SUBMANDIBULAR GLAND EXCISION Left 09/02/2017   Procedure: EXCISION LEFT SUBMANDIBULAR GLAND;  Surgeon: Izora Gala, MD;  Location: [facility];  Service: ENT;  Laterality: Left;   [place]    There were no vitals filed for this visit.   Subjective Assessment - 10/01/21 1022     Subjective Reports hips are starting to do better, has been doing exercises at home.    Pertinent History history of HTN, Diastolic HF, CVA, CAD, OSA, SOB    Diagnostic tests X-rays on 08/06/21: AP pelvis: Bilateral hips well located.  No acute fractures.  Moderate   narrowing  left hip joint.  Superior osteophytes off the acetabulum   bilaterally.  No other bony abnormalities.  2 views lumbar spine.  Minimal degenerative changes.  Overall disc space   well-maintained.  No acute fractures no spondylolisthesis.  No bony   abnormalities otherwise.  Arthrosclerosis aorta noted.    Patient Stated Goals get rid of hip pain and walk better    Currently in Pain? Yes    Pain Score 2     Pain Location Hip    Pain Orientation Right;Left                               [facility] Adult PT Treatment/Exercise - 10/01/21 0001       Exercises   Exercises Knee/Hip      Knee/Hip Exercises: Aerobic   Recumbent Bike L1 x 6 min      Knee/Hip Exercises: Standing   Heel Raises Both;2  sets;10 reps    Heel Raises Limitations UE support on window sill, cuess for eccentric control    Hip Abduction Stengthening;Both;10 reps;Knee straight;2 sets    Abduction Limitations UE support on counter    Hip Extension Stengthening;Both;10 reps;Knee straight;2 sets    Extension Limitations UE support    Other Standing Knee Exercises side steps with YTB 4 x 8 bil at counter for support.      Manual Therapy   Manual Therapy Soft tissue mobilization;Myofascial release;Joint mobilization    Manual therapy comments to bil hips to decrease pain, in prone    Joint Mobilization long leg distraction with hip in ER 10 x 5 sec hold.  Reported decreased pain.    Soft tissue mobilization IASTM with foam roller to bil glutes, lumbar paraspinals    Myofascial Release TPR to bil glutes/piriformis                     PT Education - 10/01/21 1042     Education Details added resisted side steps, issued YTB    Person(s) Educated Patient    Methods Explanation;Demonstration;Verbal cues;Handout    Comprehension Verbalized understanding;Returned demonstration              PT Short Term Goals - 09/17/21 1029       PT SHORT TERM GOAL #1   Title Ind with initial HEP    Time 2    Period Weeks    Status Achieved   09/17/21   Target Date 09/12/21               PT Long Term Goals - 09/24/21 1025       PT LONG TERM GOAL #1   Title Ind with progressed HEP to improve outcomes    Time 6    Period Weeks    Status On-going   09/24/21- met for current   Target Date 10/10/21      PT LONG TERM GOAL #2   Title Pt. will report 75% improvement in L hip/buttock pain.    Time 6    Period Weeks    Status On-going   09/24/21- progress, reports 50% improvement in L hip pain.     PT LONG TERM GOAL #3   Title Patient will be able to walk 900' without increased L hip pain to access community.    Baseline immediate pain with walking, needs 4WRW    Time 6    Period Weeks    Status  On-going  09/24/21- still reports pain with walking     PT LONG TERM GOAL #4   Title Pt. will demonstrate improved functional LE strength by completing 5x STS <17 seconds.    Baseline 22 seconds, L hip pain    Time 6    Period Weeks    Status Achieved   09/24/2021- 11 seconds, no hip pain     PT LONG TERM GOAL #5   Title Patient will be able to ambulate safely without AD.    Baseline using 4WRW, very unsteady, antalgic gait.    Time 6    Period Weeks    Status On-going   09/24/21- still using RW for community ambulation, trendelenburg gait                  Plan - 10/01/21 1023     Clinical Impression Statement Patient reports hip pain is improving and he is able to navigate stairs easier now.  Today focused on progressing standing hip strengthening exercises, he tolerated well except needed rest breaks due to dyspnea.  Still demonstrates significant tightness in bil hips and trendelenberg gait.  Progressed exercises and given YTB for resisted side stepping for glut med strengthening.  Patient would benefit from continued skilled therapy.    Personal Factors and Comorbidities Age;Comorbidity 3+    Comorbidities HTN, Diastolic HF, DOE, history CVA, CAD, OSA    PT Frequency 2x / week    PT Duration 6 weeks    PT Treatment/Interventions ADLs/Self Care Home Management;Cryotherapy;Electrical Stimulation;Moist Heat;Ultrasound;Gait training;Stair training;Functional mobility training;Therapeutic activities;Therapeutic exercise;Balance training;Neuromuscular re-education;Dry needling;Manual techniques;Taping;Spinal Manipulations;Joint Manipulations    PT Next Visit Plan check supine/long sit test, standing ther ex, manual therapy including DN as appropriate    Consulted and Agree with Plan of Care Patient             Patient will benefit from skilled therapeutic intervention in order to improve the following deficits and impairments:  Abnormal gait, Decreased activity tolerance,  Decreased endurance, Decreased range of motion, Decreased strength, Hypomobility, Pain, Decreased balance, Decreased mobility, Decreased safety awareness, Difficulty walking, Increased muscle spasms, Impaired flexibility, Postural dysfunction  Visit Diagnosis: Pain in left hip  Stiffness of left hip, not elsewhere classified  Acute left-sided low back pain with left-sided sciatica  Difficulty in walking, not elsewhere classified  Other muscle spasm     Problem List Patient Active Problem List   Diagnosis Date Noted   OSA (obstructive sleep apnea) 04/18/2021   CAD in native artery 04/18/2021   Stroke ([place]) 04/18/2021   Chronic diastolic heart failure ([place]) 03/07/2020   Submandibular gland mass 09/02/2017   Bilateral carpal tunnel syndrome 06/10/2017   Essential hypertension 02/25/2016   Hyperlipidemia 02/25/2016   Shortness of breath 02/25/2016   Appendicitis 10/17/2013    Rennie Natter, PT, DPT 10/01/2021, 12:25 PM  [facility] [address]  [place], [place], [address] Phone: [phone]   Fax:  [phone]  Name: Jeff Watkins MRN: [id number] Date of Birth: [birth year]

## 2021-10-01 NOTE — Patient Instructions (Signed)
Access Code: [email or web address] URL: [email or web address] Date: 10/01/2021 Prepared by: Harrie Foreman  Exercises Side Stepping with Resistance at Ankles and Counter Support - 1 x daily - 7 x weekly - 2 sets - 10 reps

## 2021-10-03 ENCOUNTER — Other Ambulatory Visit: Payer: Self-pay

## 2021-10-03 ENCOUNTER — Ambulatory Visit: Payer: Medicare PPO

## 2021-10-03 DIAGNOSIS — M25652 Stiffness of left hip, not elsewhere classified: Secondary | ICD-10-CM | POA: Diagnosis not present

## 2021-10-03 DIAGNOSIS — M5442 Lumbago with sciatica, left side: Secondary | ICD-10-CM | POA: Diagnosis not present

## 2021-10-03 DIAGNOSIS — M25552 Pain in left hip: Secondary | ICD-10-CM

## 2021-10-03 DIAGNOSIS — M62838 Other muscle spasm: Secondary | ICD-10-CM

## 2021-10-03 DIAGNOSIS — R262 Difficulty in walking, not elsewhere classified: Secondary | ICD-10-CM

## 2021-10-03 NOTE — Therapy (Signed)
[facility] [address]  [place], [place], [address] Phone: [phone]   Fax:  [phone]  Physical Therapy Treatment  Patient Details  Name: Jeff Watkins MRN: [id number] Date of Birth: [birth year] Referring Provider (PT): Erskine Emery PA-C   Encounter Date: 10/03/2021   PT End of Session - 10/03/21 1025     Visit Number 9    Number of Visits 12    Date for PT Re-Evaluation 10/10/21    Authorization Type [employer]    Authorization Time Period 13 visits approved from 11.3.22 - 12.15.22    Authorization - Visit Number 5    Authorization - Number of Visits 13    Progress Note Due on Visit 10    PT Start Time 0928    PT Stop Time 1010    PT Time Calculation (min) 42 min    Activity Tolerance Patient tolerated treatment well    Behavior During Therapy [facility] for tasks assessed/performed             Past Medical History:  Diagnosis Date   Arthritis    Asthma    Bronchitis    CAD in native artery 04/18/2021   Carpal tunnel syndrome, bilateral    Chronic diastolic heart failure ([place]) 03/07/2020   Coronary artery disease    50-70% LAD by 2001 cath   Essential hypertension 02/25/2016   GERD (gastroesophageal reflux disease)    Hypercholesteremia    Hyperlipidemia 02/25/2016   Hypertension    Obesity (BMI 30-39.9) 02/25/2016   OSA (obstructive sleep apnea) 04/18/2021   Pre-diabetes    Shortness of breath 02/25/2016   Sleep apnea    does not use CPAP   Stroke ([place]) ~2010   mini stroke    Stroke ([place]) 04/18/2021    Past Surgical History:  Procedure Laterality Date   CARDIAC CATHETERIZATION  2001   CARPAL TUNNEL RELEASE Right 07/06/2018   Procedure: RIGHT CARPAL TUNNEL RELEASE, EXTENDED EXCISION;  Surgeon: Daryll Brod, MD;  Location: [place];  Service: Orthopedics;  Laterality: Right;   CARPAL TUNNEL RELEASE Left 04/12/2019   Procedure: CARPAL TUNNEL RELEASE;  Surgeon: Daryll Brod, MD;  Location:  [place];  Service: Orthopedics;  Laterality: Left;   COLONOSCOPY WITH PROPOFOL N/A 05/21/2015   Procedure: COLONOSCOPY WITH PROPOFOL;  Surgeon: Garlan Fair, MD;  [facility];  Service: Endoscopy;  Laterality: N/A;   EYE SURGERY     bilateral cataracts   FRACTURE SURGERY  1966   compound arm   HERNIA REPAIR     LAPAROSCOPIC APPENDECTOMY N/A 10/18/2013   Procedure: APPENDECTOMY LAPAROSCOPIC;  Surgeon: Gwenyth Ober, MD;  Location: [place];  Service: General;  Laterality: N/A;   SUBMANDIBULAR GLAND EXCISION Left 09/02/2017    Left submandibular gland and associated soft tissue and lymph nodes   SUBMANDIBULAR GLAND EXCISION Left 09/02/2017   Procedure: EXCISION LEFT SUBMANDIBULAR GLAND;  Surgeon: Izora Gala, MD;  Location: [place];  Service: ENT;  Laterality: Left;   [place]    There were no vitals filed for this visit.   Subjective Assessment - 10/03/21 0932     Subjective Pt doing well and better since starting PT, still has a little pain.    Pertinent History history of HTN, Diastolic HF, CVA, CAD, OSA, SOB    Diagnostic tests X-rays on 08/06/21: AP pelvis: Bilateral hips well located.  No acute fractures.  Moderate   narrowing  left hip joint.  Superior osteophytes off the acetabulum   bilaterally.  No other bony abnormalities.  2 views lumbar spine.  Minimal degenerative changes.  Overall disc space   well-maintained.  No acute fractures no spondylolisthesis.  No bony   abnormalities otherwise.  Arthrosclerosis aorta noted.    Patient Stated Goals get rid of hip pain and walk better    Currently in Pain? Yes    Pain Score 2     Pain Location Hip    Pain Orientation Right;Left    Pain Descriptors / Indicators Aching                               [facility] Adult PT Treatment/Exercise - 10/03/21 0001       Knee/Hip Exercises: Stretches   Piriformis Stretch Both;2 reps;30 seconds    Piriformis Stretch Limitations seated  figure 4 with OP      Knee/Hip Exercises: Aerobic   Nustep L5x80min      Knee/Hip Exercises: Standing   Hip Abduction Stengthening;Both;10 reps;Knee straight    Abduction Limitations yellow TB at ankles    Hip Extension Stengthening;Both;10 reps;Knee straight    Extension Limitations yellow TB at ankles    Other Standing Knee Exercises side steps with YTB 6 x 9' bil at counter for support.    Other Standing Knee Exercises supported DL 10 reps      Knee/Hip Exercises: Seated   Long Arc Quad Strengthening;Both;10 reps    Long Arc Quad Limitations with ball squeeze    Abduction/Adduction  Strengthening;Both;10 reps    Abd/Adduction Limitations with yellow TB                       PT Short Term Goals - 09/17/21 1029       PT SHORT TERM GOAL #1   Title Ind with initial HEP    Time 2    Period Weeks    Status Achieved   09/17/21   Target Date 09/12/21               PT Long Term Goals - 10/03/21 0934       PT LONG TERM GOAL #1   Title Ind with progressed HEP to improve outcomes    Time 6    Period Weeks    Status On-going   09/24/21- met for current   Target Date 10/10/21      PT LONG TERM GOAL #2   Title Pt. will report 75% improvement in L hip/buttock pain.    Time 6    Period Weeks    Status Partially Met   10/03/21- 70% improvement     PT LONG TERM GOAL #3   Title Patient will be able to walk 900' without increased L hip pain to access community.    Baseline immediate pain with walking, needs 4WRW    Time 6    Period Weeks    Status On-going   09/24/21- still reports pain with walking     PT LONG TERM GOAL #4   Title Pt. will demonstrate improved functional LE strength by completing 5x STS <17 seconds.    Baseline 22 seconds, L hip pain    Time 6    Period Weeks    Status Achieved   09/24/2021- 11 seconds, no hip pain     PT LONG TERM GOAL #5   Title Patient will be able  to ambulate safely without AD.    Baseline using 4WRW, very unsteady,  antalgic gait.    Time 6    Period Weeks    Status On-going   09/24/21- still using RW for community ambulation, trendelenburg gait                  Plan - 10/03/21 1027     Clinical Impression Statement Lots of compensation seen today with standing hip extension. Still showing hip drop with gait so continued working on hip ABD strength. Pt showed some SOB after duration of standing exercises and aerobic warm up. Seated figure 4 stretch flexibility has improved. Able to make it through all interventions with no increased pain.  More progress being made with LTG #2.    Personal Factors and Comorbidities Age;Comorbidity 3+    Comorbidities HTN, Diastolic HF, DOE, history CVA, CAD, OSA    PT Frequency 2x / week    PT Duration 6 weeks    PT Treatment/Interventions ADLs/Self Care Home Management;Cryotherapy;Electrical Stimulation;Moist Heat;Ultrasound;Gait training;Stair training;Functional mobility training;Therapeutic activities;Therapeutic exercise;Balance training;Neuromuscular re-education;Dry needling;Manual techniques;Taping;Spinal Manipulations;Joint Manipulations    PT Next Visit Plan progress note/recert    Consulted and Agree with Plan of Care Patient             Patient will benefit from skilled therapeutic intervention in order to improve the following deficits and impairments:  Abnormal gait, Decreased activity tolerance, Decreased endurance, Decreased range of motion, Decreased strength, Hypomobility, Pain, Decreased balance, Decreased mobility, Decreased safety awareness, Difficulty walking, Increased muscle spasms, Impaired flexibility, Postural dysfunction  Visit Diagnosis: Pain in left hip  Stiffness of left hip, not elsewhere classified  Acute left-sided low back pain with left-sided sciatica  Difficulty in walking, not elsewhere classified  Other muscle spasm     Problem List Patient Active Problem List   Diagnosis Date Noted   OSA (obstructive  sleep apnea) 04/18/2021   CAD in native artery 04/18/2021   Stroke ([place]) 04/18/2021   Chronic diastolic heart failure ([place]) 03/07/2020   Submandibular gland mass 09/02/2017   Bilateral carpal tunnel syndrome 06/10/2017   Essential hypertension 02/25/2016   Hyperlipidemia 02/25/2016   Shortness of breath 02/25/2016   Appendicitis 10/17/2013    Artist Pais, PTA 10/03/2021, 11:58 AM  [facility] [address]  [place], [place], [address] Phone: [phone]   Fax:  [phone]  Name: EUGEN JEANSONNE MRN: [id number] Date of Birth: 1936/09/03

## 2021-10-07 DIAGNOSIS — K219 Gastro-esophageal reflux disease without esophagitis: Secondary | ICD-10-CM | POA: Diagnosis not present

## 2021-10-07 DIAGNOSIS — E1122 Type 2 diabetes mellitus with diabetic chronic kidney disease: Secondary | ICD-10-CM | POA: Diagnosis not present

## 2021-10-07 DIAGNOSIS — M4722 Other spondylosis with radiculopathy, cervical region: Secondary | ICD-10-CM | POA: Diagnosis not present

## 2021-10-07 DIAGNOSIS — D509 Iron deficiency anemia, unspecified: Secondary | ICD-10-CM | POA: Diagnosis not present

## 2021-10-07 DIAGNOSIS — I2510 Atherosclerotic heart disease of native coronary artery without angina pectoris: Secondary | ICD-10-CM | POA: Diagnosis not present

## 2021-10-07 DIAGNOSIS — J45991 Cough variant asthma: Secondary | ICD-10-CM | POA: Diagnosis not present

## 2021-10-07 DIAGNOSIS — J45901 Unspecified asthma with (acute) exacerbation: Secondary | ICD-10-CM | POA: Diagnosis not present

## 2021-10-07 DIAGNOSIS — I10 Essential (primary) hypertension: Secondary | ICD-10-CM | POA: Diagnosis not present

## 2021-10-08 ENCOUNTER — Ambulatory Visit: Payer: Medicare PPO | Admitting: Physical Therapy

## 2021-10-08 ENCOUNTER — Other Ambulatory Visit: Payer: Self-pay

## 2021-10-08 ENCOUNTER — Encounter: Payer: Self-pay | Admitting: Physical Therapy

## 2021-10-08 DIAGNOSIS — M25552 Pain in left hip: Secondary | ICD-10-CM

## 2021-10-08 DIAGNOSIS — M62838 Other muscle spasm: Secondary | ICD-10-CM

## 2021-10-08 DIAGNOSIS — R262 Difficulty in walking, not elsewhere classified: Secondary | ICD-10-CM | POA: Diagnosis not present

## 2021-10-08 DIAGNOSIS — M25652 Stiffness of left hip, not elsewhere classified: Secondary | ICD-10-CM | POA: Diagnosis not present

## 2021-10-08 DIAGNOSIS — M5442 Lumbago with sciatica, left side: Secondary | ICD-10-CM | POA: Diagnosis not present

## 2021-10-08 NOTE — Patient Instructions (Signed)
Access Code: [email or web address] URL: [email or web address] Date: 10/08/2021 Prepared by: Harrie Foreman  Exercises Standing Single Leg Stance with Counter Support - 1 x daily - 7 x weekly - 1 sets - 3 reps - 30 sec hold Seated Flexion Stretch with Swiss Ball - 1 x daily - 7 x weekly - 1 sets - 10 reps

## 2021-10-08 NOTE — Therapy (Addendum)
PHYSICAL THERAPY DISCHARGE SUMMARY  Visits from Start of Care: 10  Current functional level related to goals / functional outcomes: Patient has made good progress and reports at least 75% improvement overall with hip pain.  His FOTO has improved from 50% to 65%, which is more than predicted.  He reports good compliance with HEP.  He was able to walk for 5 min today (900') in hallway with 4WRW before having increased pain in L buttock, sitting down and stretching with ball relieved pain,. He was placed on 30 day hold on 10/08/2021 since he was compliant with HEP and has met LTG #1,2,4 and making progress towards LTG #3 and #5 and desired to just continued HEP at home.     Remaining deficits: See progress note   Education / Equipment: HEP  Plan: Patient agrees to discharge.   Patient is being discharged due to meeting the stated rehab goals.  Placed on 30 day hold on 10/08/2021 and has not returned within that time frame.    Rennie Natter, PT, DPT 11/08/2021   [facility] [address]  [place], [place], [address] Phone: [phone]   Fax:  [phone]  Physical Therapy Treatment  Progress Note Reporting Period 08/29/2021 to 10/08/2021  See note below for Objective Data and Assessment of Progress/Goals.     Patient Details  Name: WISE FEES MRN: [id number] Date of Birth: 1936/09/19 Referring Provider (PT): Erskine Emery PA-C   Encounter Date: 10/08/2021   PT End of Session - 10/08/21 1020     Visit Number 10    Number of Visits 12    Date for PT Re-Evaluation 10/10/21    Authorization Type [employer]    Authorization Time Period 13 visits approved from 11.3.22 - 12.15.22    Authorization - Visit Number 5    Authorization - Number of Visits 13    Progress Note Due on Visit 10    PT Start Time 1017    PT Stop Time 1100    PT Time Calculation (min) 43 min    Activity Tolerance Patient tolerated  treatment well    Behavior During Therapy [facility] for tasks assessed/performed             Past Medical History:  Diagnosis Date   Arthritis    Asthma    Bronchitis    CAD in native artery 04/18/2021   Carpal tunnel syndrome, bilateral    Chronic diastolic heart failure ([place]) 03/07/2020   Coronary artery disease    50-70% LAD by 2001 cath   Essential hypertension 02/25/2016   GERD (gastroesophageal reflux disease)    Hypercholesteremia    Hyperlipidemia 02/25/2016   Hypertension    Obesity (BMI 30-39.9) 02/25/2016   OSA (obstructive sleep apnea) 04/18/2021   Pre-diabetes    Shortness of breath 02/25/2016   Sleep apnea    does not use CPAP   Stroke ([place]) ~2010   mini stroke    Stroke ([place]) 04/18/2021    Past Surgical History:  Procedure Laterality Date   CARDIAC CATHETERIZATION  2001   CARPAL TUNNEL RELEASE Right 07/06/2018   Procedure: RIGHT CARPAL TUNNEL RELEASE, EXTENDED EXCISION;  Surgeon: Daryll Brod, MD;  Location: [place];  Service: Orthopedics;  Laterality: Right;   CARPAL TUNNEL RELEASE Left 04/12/2019   Procedure: CARPAL TUNNEL RELEASE;  Surgeon: Daryll Brod, MD;  Location: [place];  Service: Orthopedics;  Laterality: Left;   COLONOSCOPY WITH  PROPOFOL N/A 05/21/2015   Procedure: COLONOSCOPY WITH PROPOFOL;  Surgeon: Garlan Fair, MD;  [facility];  Service: Endoscopy;  Laterality: N/A;   EYE SURGERY     bilateral cataracts   FRACTURE SURGERY  1966   compound arm   HERNIA REPAIR     LAPAROSCOPIC APPENDECTOMY N/A 10/18/2013   Procedure: APPENDECTOMY LAPAROSCOPIC;  Surgeon: Gwenyth Ober, MD;  Location: [place];  Service: General;  Laterality: N/A;   SUBMANDIBULAR GLAND EXCISION Left 09/02/2017    Left submandibular gland and associated soft tissue and lymph nodes   SUBMANDIBULAR GLAND EXCISION Left 09/02/2017   Procedure: EXCISION LEFT SUBMANDIBULAR GLAND;  Surgeon: Izora Gala, MD;  Location: [place];  Service: ENT;  Laterality:  Left;   [place]    There were no vitals filed for this visit.   Subjective Assessment - 10/08/21 1019     Subjective Patient reports hips are improving "trying to do the therapy at home."  He reports at least 75% improvement in L hip pain, and improve ability to walk around, although he has found he likes using his 4WRW as he can walk a lot faster with it.  He would like to transition to his HEP at home.    Pertinent History history of HTN, Diastolic HF, CVA, CAD, OSA, SOB    Diagnostic tests X-rays on 08/06/21: AP pelvis: Bilateral hips well located.  No acute fractures.  Moderate   narrowing left hip joint.  Superior osteophytes off the acetabulum   bilaterally.  No other bony abnormalities.  2 views lumbar spine.  Minimal degenerative changes.  Overall disc space   well-maintained.  No acute fractures no spondylolisthesis.  No bony   abnormalities otherwise.  Arthrosclerosis aorta noted.    Patient Stated Goals get rid of hip pain and walk better    Currently in Pain? Yes    Pain Score 2     Pain Location Hip    Pain Orientation Right;Left    Pain Descriptors / Indicators Aching                [facility] PT Assessment - 10/08/21 0001       Assessment   Medical Diagnosis M54.5 chronic LBP without sciatica    Referring Provider (PT) Erskine Emery PA-C      Observation/Other Assessments   Focus on Therapeutic Outcomes (FOTO)  hip 8   59 predicted after 11 visits                          [facility] Adult PT Treatment/Exercise - 10/08/21 0001       Ambulation/Gait   Ambulation/Gait Yes    Ambulation/Gait Assistance 6: Modified independent (Device/Increase time)    Ambulation Distance (Feet) 900 Feet    Assistive device 4-wheeled walker    Gait Pattern Trendelenburg    Gait Comments gait x 100' without AD, trendelenburg gait      Exercises   Exercises Knee/Hip      Knee/Hip Exercises: Stretches   Piriformis Stretch Both;2 reps;30 seconds     Piriformis Stretch Limitations seated figure 4 - can do easily now both sides      Knee/Hip Exercises: Aerobic   Nustep L6 x 6 min   no rest break needed but DOE     Knee/Hip Exercises: Standing   Abduction Limitations reviewed    Extension Limitations reviewed    Other Standing Knee Exercises SLS at counter 2  x 30 sec bil      Knee/Hip Exercises: Seated   Ball Squeeze reviewed    Abd/Adduction Limitations reviewed      Manual Therapy   Manual Therapy Soft tissue mobilization    Manual therapy comments to bil hips to decrease pain, in prone    Soft tissue mobilization IASTM with foam roller to bil glutes, lumbar paraspinals                     PT Education - 10/08/21 1305     Education Details reviewed and progressed HEP.  Access Code: [email or web address]    Person(s) Educated Patient    Methods Explanation;Demonstration;Verbal cues;Handout    Comprehension Verbalized understanding;Returned demonstration              PT Short Term Goals - 09/17/21 1029       PT SHORT TERM GOAL #1   Title Ind with initial HEP    Time 2    Period Weeks    Status Achieved   09/17/21   Target Date 09/12/21               PT Long Term Goals - 10/08/21 1031       PT LONG TERM GOAL #1   Title Ind with progressed HEP to improve outcomes    Time 6    Period Weeks    Status Achieved   10/08/21- met for current   Target Date 10/10/21      PT LONG TERM GOAL #2   Title Pt. will report 75% improvement in L hip/buttock pain.    Time 6    Period Weeks    Status Achieved   10/03/21- 70% improvement 12/13- at least 75% improvement.     PT LONG TERM GOAL #3   Title Patient will be able to walk 900' without increased L hip pain to access community.    Baseline immediate pain with walking, needs 4WRW    Time 6    Period Weeks    Status Partially Met   09/24/21- still reports pain with walking  10/08/21- able to walk for 5 min with 4WRW before complaint of LLE pain.     PT LONG TERM  GOAL #4   Title Pt. will demonstrate improved functional LE strength by completing 5x STS <17 seconds.    Baseline 22 seconds, L hip pain    Time 6    Period Weeks    Status Achieved   09/24/2021- 11 seconds, no hip pain     PT LONG TERM GOAL #5   Title Patient will be able to ambulate safely without AD.    Baseline using 4WRW, very unsteady, antalgic gait.    Time 6    Period Weeks    Status On-going   09/24/21- still using RW for community ambulation, trendelenburg gait  10/08/21- prefers 4WRW for community ambulation.                  Plan - 10/08/21 1316     Clinical Impression Statement Patient has made good progress and reports at least 75% improvement overall with hip pain.  His FOTO has improved from 50% to 65%, which is more than predicted.  He reports good compliance with HEP.  Focused today on reviewing HEP to continue hip strengthening to improve gait and answering all questions and concerns about his HEP.  He was able to walk for 5 min today (900') in hallway with 4WRW before  having increased pain in L buttock, sitting down and stretching with ball relieved pain, so demonstrated how to do with 4WRW instead of ball.  He was placed on 30 day hold today since he is compliant with HEP and has met LTG #1,2,4 and making progress towards LTG #3 and #5.    Personal Factors and Comorbidities Age;Comorbidity 3+    Comorbidities HTN, Diastolic HF, DOE, history CVA, CAD, OSA    PT Frequency 2x / week    PT Duration 6 weeks    PT Treatment/Interventions ADLs/Self Care Home Management;Cryotherapy;Electrical Stimulation;Moist Heat;Ultrasound;Gait training;Stair training;Functional mobility training;Therapeutic activities;Therapeutic exercise;Balance training;Neuromuscular re-education;Dry needling;Manual techniques;Taping;Spinal Manipulations;Joint Manipulations    PT Next Visit Plan 30 day hold    Consulted and Agree with Plan of Care Patient             Patient will benefit  from skilled therapeutic intervention in order to improve the following deficits and impairments:  Abnormal gait, Decreased activity tolerance, Decreased endurance, Decreased range of motion, Decreased strength, Hypomobility, Pain, Decreased balance, Decreased mobility, Decreased safety awareness, Difficulty walking, Increased muscle spasms, Impaired flexibility, Postural dysfunction  Visit Diagnosis: Pain in left hip  Stiffness of left hip, not elsewhere classified  Acute left-sided low back pain with left-sided sciatica  Difficulty in walking, not elsewhere classified  Other muscle spasm     Problem List Patient Active Problem List   Diagnosis Date Noted   OSA (obstructive sleep apnea) 04/18/2021   CAD in native artery 04/18/2021   Stroke ([place]) 04/18/2021   Chronic diastolic heart failure ([place]) 03/07/2020   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

## 2021-10-14 ENCOUNTER — Encounter (HOSPITAL_BASED_OUTPATIENT_CLINIC_OR_DEPARTMENT_OTHER): Payer: Self-pay | Admitting: Cardiovascular Disease

## 2021-10-14 ENCOUNTER — Ambulatory Visit (HOSPITAL_BASED_OUTPATIENT_CLINIC_OR_DEPARTMENT_OTHER): Payer: Medicare PPO | Admitting: Cardiovascular Disease

## 2021-10-14 ENCOUNTER — Other Ambulatory Visit: Payer: Self-pay

## 2021-10-14 DIAGNOSIS — I5032 Chronic diastolic (congestive) heart failure: Secondary | ICD-10-CM | POA: Diagnosis not present

## 2021-10-14 DIAGNOSIS — I2510 Atherosclerotic heart disease of native coronary artery without angina pectoris: Secondary | ICD-10-CM

## 2021-10-14 DIAGNOSIS — E782 Mixed hyperlipidemia: Secondary | ICD-10-CM

## 2021-10-14 DIAGNOSIS — R001 Bradycardia, unspecified: Secondary | ICD-10-CM | POA: Diagnosis not present

## 2021-10-14 DIAGNOSIS — I10 Essential (primary) hypertension: Secondary | ICD-10-CM | POA: Diagnosis not present

## 2021-10-14 HISTORY — DX: Bradycardia, unspecified: R00.1

## 2021-10-14 NOTE — Assessment & Plan Note (Signed)
LDL was 44 on 04/2021.  Continue atorvastatin.

## 2021-10-14 NOTE — Assessment & Plan Note (Signed)
Blood pressure is reasonably well controlled.  Given that he has low diastolic blood pressures and bradycardia in the 40s and 50s, we will not push his medications any harder.  Continue amlodipine, doxazosin, hydralazine, furosemide, spironolactone, valsartan/HCTZ.  Renal function is stable despite multiple diuretics.

## 2021-10-14 NOTE — Assessment & Plan Note (Signed)
Well compensated.  He does have minimal lower extremity edema that I attribute more to amlodipine and venous insufficiency than heart failure.  He has no heart failure symptoms.  Blood pressure is well-controlled.  Continue Lasix.

## 2021-10-14 NOTE — Patient Instructions (Addendum)
Medication Instructions:  Continue current medications  *If you need a refill on your cardiac medications before your next appointment, please call your pharmacy*   Lab Work: None Ordered   Testing/Procedures: None Ordered   Follow-Up: At [facility], you and your health needs are our priority.  As part of our continuing mission to provide you with exceptional heart care, we have created designated Provider Care Teams.  These Care Teams include your primary Cardiologist (physician) and Advanced Practice Providers (APPs -  Physician Assistants and Nurse Practitioners) who all work together to provide you with the care you need, when you need it.  We recommend signing up for the patient portal called "MyChart".  Sign up information is provided on this After Visit Summary.  MyChart is used to connect with patients for Virtual Visits (Telemedicine).  Patients are able to view lab/test results, encounter notes, upcoming appointments, etc.  Non-urgent messages can be sent to your provider as well.   To learn more about what you can do with MyChart, go to [email or web address].    Your next appointment:   6 Months  The format for your next appointment:   In Person  Provider:   [name], MD

## 2021-10-14 NOTE — Assessment & Plan Note (Signed)
He is doing well and has no angina.  Continue clopidogrel and atorvastatin.  He is not on a beta-blocker due to bradycardia.

## 2021-10-14 NOTE — Assessment & Plan Note (Signed)
His heart rate has been in the 40s and 50s.  However he is asymptomatic.  Avoid nodal agents.  No indication for PPM at this time.

## 2021-10-14 NOTE — Progress Notes (Signed)
Cardiology Office Note  Date:  02/24/20  ID:  Jeff Watkins, DOB November 27, 1935, MRN [id number]  PCP:  Leeroy Cha, MD  Cardiologist:   Skeet Latch, MD   Chief Complaint  Patient presents with   Follow-up   History of Present Illness: Jeff Watkins is a 85 y.o. male with moderate CAD, hypertension, hyperlipidemia, OSA, GERD and diabetes who presents for follow up.  Jeff Watkins was first seen on 5/1 with a complaint of dyspnea on exertion.  He was referred for an echo that showed LVEF 55-60% and grade 2 diastolic dysfunction.  He also had an exercise Myoview that was negative for ischemia but did reveal frequent PVCs.  He exercised for 6 minutes on the Bruce protocol (5.4 METS). At that appointment his losartan/HCTZ was increased due to poorly-controlled hypertension. Hydralazine was then increased to 50mg  bid.  Jeff Watkins had PVCs and metoprolol was increased.  Jeff Watkins had a home health visit through his insurance.  He was noted to have some mild edema and his BP was elevated.  He also had carpal tunnel surgery.  He has had carpal tunnel in both arms.  There was concern for cardiac amyloidosis.  He was referred for an ATT amyloid scan that was negative.  Hydralazine was also increased due to poorly controlled hypertension.  Due to his exertional dyspnea his echo was repeated.  His echo 02/2020 revealed LVEF 60 to 65% with grade 2 diastolic dysfunction.     Jeff Watkins BP was averaging 124/62 but labile.  He was switched from metoprolol to carvedilol. He reported shortness of breath and was asked to increase his exercise. His blood pressure was elevated in the office but he was unsure what it was running at home. He was given a blood pressure cuff and was encouraged to check it regularly. He called the office and noted that he was not taking the spironolactone that was prescribed, so this was resumed 10/2020.   At his last appointment he reported persistent shortness of breath and fatigue. He  noted that he had previously been unable to tolerate his CPAP. He noted that he was bradycardic so carvedilol was stopped. He followed up with Laurann Montana, NP 06/2021 and his heart rate remained 53 despite stopping his beta blocker. He wore a 3 day Zio, which didn't show any significant pauses. He did have some atrial runs up to 30 beats, and rare PACs and PVCs. His wife passed away 2 weeks prior to that appointment.  Today, he states he is feeling fair. In October he would have been married for 104 years. He is continuing one day at a time, and plans to visit his son for Christmas. In the past couple of years he has noticed that he constantly feels more cold than baseline. He notes he is cold-natured, and is often wearing a sweater at home with his heat set to 74 degrees. In the last 3-4 months his exercise has been limited by hip pain. He has completed his physical therapy. At home his blood pressure has been ranging from the 120s-150s and averaging in the 130s. Per his log his heart rate is sometimes in the 40s. He denies any lightheadedness or dizziness. He denies any palpitations, chest pain, or shortness of breath. No headaches, syncope, orthopnea, PND, or lower extremity edema.   Past Medical History:  Diagnosis Date   Arthritis    Asthma    Bradycardia 10/14/2021   Bronchitis    CAD in native  artery 04/18/2021   Carpal tunnel syndrome, bilateral    Chronic diastolic heart failure (HCC) 03/07/2020   Coronary artery disease    50-70% LAD by 2001 cath   Essential hypertension 02/25/2016   GERD (gastroesophageal reflux disease)    Hypercholesteremia    Hyperlipidemia 02/25/2016   Hypertension    Obesity (BMI 30-39.9) 02/25/2016   OSA (obstructive sleep apnea) 04/18/2021   Pre-diabetes    Shortness of breath 02/25/2016   Sleep apnea    does not use CPAP   Stroke (HCC) ~2010   mini stroke    Stroke (HCC) 04/18/2021    Past Surgical History:  Procedure Laterality Date   CARDIAC  CATHETERIZATION  2001   CARPAL TUNNEL RELEASE Right 07/06/2018   Procedure: RIGHT CARPAL TUNNEL RELEASE, EXTENDED EXCISION;  Surgeon: Cindee Salt, MD;  Location: [facility];  Service: Orthopedics;  Laterality: Right;   CARPAL TUNNEL RELEASE Left 04/12/2019   Procedure: CARPAL TUNNEL RELEASE;  Surgeon: Cindee Salt, MD;  Location: [facility];  Service: Orthopedics;  Laterality: Left;   COLONOSCOPY WITH PROPOFOL N/A 05/21/2015   Procedure: COLONOSCOPY WITH PROPOFOL;  Surgeon: [name], MD;  Location: [facility];  Service: Endoscopy;  Laterality: N/A;   EYE SURGERY     bilateral cataracts   FRACTURE SURGERY  1966   compound arm   HERNIA REPAIR     LAPAROSCOPIC APPENDECTOMY N/A 10/18/2013   Procedure: APPENDECTOMY LAPAROSCOPIC;  Surgeon: [name], MD;  Location: [facility];  Service: General;  Laterality: N/A;   SUBMANDIBULAR GLAND EXCISION Left 09/02/2017    Left submandibular gland and associated soft tissue and lymph nodes   SUBMANDIBULAR GLAND EXCISION Left 09/02/2017   Procedure: EXCISION LEFT SUBMANDIBULAR GLAND;  Surgeon: Serena Colonel, MD;  Location: [facility];  Service: ENT;  Laterality: Left;   TESTICLE REMOVAL  1965    Current Outpatient Medications  Medication Sig Dispense Refill   albuterol (PROVENTIL HFA;VENTOLIN HFA) 108 (90 BASE) MCG/ACT inhaler Inhale 1-2 puffs into the lungs every 6 (six) hours as needed for wheezing or shortness of breath. Reported on 05/08/2016     amLODipine (NORVASC) 10 MG tablet Take 10 mg by mouth at bedtime.      atorvastatin (LIPITOR) 40 MG tablet Take 40 mg by mouth at bedtime.      clopidogrel (PLAVIX) 75 MG tablet Take 75 mg by mouth daily with breakfast.     COVID-19 mRNA Vac-TriS, Pfizer, (PFIZER-BIONT COVID-19 VAC-TRIS) SUSP injection Inject into the muscle. 0.3 mL 0   doxazosin (CARDURA) 8 MG tablet TAKE 1 TABLET BY MOUTH DAILY 90 tablet 3   doxycycline (VIBRA-TABS) 100 MG tablet Take 1 tablet (100 mg total) by  mouth 2 (two) times daily. 20 tablet 0   ferrous sulfate 325 (65 FE) MG tablet Take 325 mg by mouth daily with breakfast.     Fish Oil-Cholecalciferol (FISH OIL + D3 PO) Take 1 capsule by mouth every morning.      furosemide (LASIX) 20 MG tablet TAKE ONE (1) TABLET BY MOUTH EVERY DAY 90 tablet 3   hydrALAZINE (APRESOLINE) 100 MG tablet TAKE ONE (1) TABLET BY MOUTH TWO (2) TIMES DAILY 180 tablet 1   loratadine (CLARITIN) 10 MG tablet Take 10 mg by mouth every morning.      Multiple Vitamin (MULTI VITAMIN MENS PO) Take 1 tablet by mouth every evening.      pantoprazole (PROTONIX) 20 MG tablet TAKE ONE (1) TABLET EACH DAY 30 tablet 0  Polyethyl Glycol-Propyl Glycol (SYSTANE OP) Place 1 drop into both eyes at bedtime.      spironolactone (ALDACTONE) 25 MG tablet Take 1 tablet (25 mg total) by mouth daily. 90 tablet 3   Testosterone 20.25 MG/1.25GM (1.62%) GEL Place 2 Pump onto the skin every other day.     traMADol (ULTRAM) 50 MG tablet TAKE ONE (1) TABLET BY MOUTH EVERY 6 HOURS AS NEEDED 30 tablet 0   valsartan-hydrochlorothiazide (DIOVAN-HCT) 320-25 MG tablet Take 1 tablet by mouth daily. 30 tablet 6   No current facility-administered medications for this visit.    Allergies:   Elemental sulfur and Penicillins    Social History:  The patient  reports that he quit smoking about 61 years ago. His smoking use included cigarettes. He has a 0.13 pack-year smoking history. He has never used smokeless tobacco. He reports that he does not drink alcohol and does not use drugs.   Family History:  The patient's family history includes Cancer in his brother; HIV/AIDS (age of onset: 81) in his daughter; Heart disease in his father; Other in his son; Stroke in his father.    ROS:   Please see the history of present illness. (+) Chills (+) Bilateral hip pain All other systems are reviewed and negative.    PHYSICAL EXAM: VS:  BP (!) 136/58    Pulse 62    Ht 5\' 8"  (1.727 m)    Wt 230 lb 4.8 oz (104.5  kg)    SpO2 97%    BMI 35.02 kg/m  , BMI Body mass index is 35.02 kg/m. GENERAL:  Well appearing HEENT: Pupils equal round and reactive, fundi not visualized, oral mucosa unremarkable NECK:  No jugular venous distention, waveform within normal limits, carotid upstroke brisk and symmetric, no bruits LUNGS:  Clear to auscultation bilaterally HEART:  RRR.  PMI not displaced or sustained,S1 and S2 within normal limits, no S3, no S4, no clicks, no rubs, no murmurs ABD:  Flat, positive bowel sounds normal in frequency in pitch, no bruits, no rebound, no guarding, no midline pulsatile mass, no hepatomegaly, no splenomegaly EXT:  1+ DP/PT bilaterally, trace bilateral LE edema, no cyanosis no clubbing SKIN:  No rashes no nodules NEURO:  Cranial nerves II through XII grossly intact, motor grossly intact throughout PSYCH:  Cognitively intact, oriented to person place and time  EKG:   10/14/2021: EKG was not ordered. 04/18/2021: Sinus bradycardia. Rate 42 bpm. 09/26/2020: EKG was not ordered. 02/24/20: Sinus rhythm.  Rate 66 bpm.  06/24/18: Sinus bradycardia.  Rate 59 bpm.   02/25/16: sinus rhythm. Rate 63 bpm. Nonspecific T wave flattening.   Monitor 07/2021: 7 Day Zio Monitor   Quality: Fair.  Baseline artifact. Predominant rhythm: sinus bradycardia Average heart rate: 51 bpm Max heart rate: 81 bpm Min heart rate: 36 bpm Pauses >2.5 seconds: none   Several atrial runs up to 30 beats Rare PACs Rare PVCs  Echo 03/15/2020: 1. Left ventricular ejection fraction, by estimation, is 60 to 65%. The  left ventricle has normal function. The left ventricle has no regional  wall motion abnormalities. Left ventricular diastolic parameters are  consistent with Grade II diastolic  dysfunction (pseudonormalization).   2. Right ventricular systolic function is normal. The right ventricular  size is normal. There is mildly elevated pulmonary artery systolic  pressure.   3. Left atrial size was mildly  dilated.   4. The mitral valve is normal in structure. No evidence of mitral valve  regurgitation. No evidence  of mitral stenosis.   5. The aortic valve is tricuspid. Aortic valve regurgitation is mild. No  aortic stenosis is present.   6. Aortic dilatation noted. There is mild dilatation of the ascending  aorta measuring 42 mm.   7. The inferior vena cava is normal in size with greater than 50%  respiratory variability, suggesting right atrial pressure of 3 mmHg.  Myocardial Amyloid Imaging Planar and Spect 03/14/2020: The study is normal.   The study is normal. The study is not suggestive of TTR amyloidosis (visual score of 0 or H/CL ratio<1).  Renal Artery Duplex 03/14/2020: Summary:  Renal:     Right: Normal size right kidney. Normal right Resisitive Index.         Normal cortical thickness of right kidney. No evidence of         right renal artery stenosis. RRV flow present.  Left:  Normal size of left kidney. Abnormal left Resisitve Index.         Normal cortical thickness of the left kidney. No evidence of         left renal artery stenosis. LRV flow present. Cyst(s) noted.         measuring 3.8 cm 3.8 cm.  Mesenteric:  Normal Celiac artery and Superior Mesenteric artery findings.   Echo 03/19/16: Study Conclusions   - Left ventricle: The cavity size was normal. There was   mild-moderate concentric hypertrophy. Systolic function was   normal. The estimated ejection fraction was in the range of 55%   to 60%. Wall motion was normal; there were no regional wall   motion abnormalities. Features are consistent with a pseudonormal   left ventricular filling pattern, with concomitant abnormal   relaxation and increased filling pressure (grade 2 diastolic   dysfunction). Doppler parameters are consistent with   indeterminate ventricular filling pressure. - Aortic valve: Transvalvular velocity was within the normal range.   There was no stenosis. There was no regurgitation. Valve  area   (VTI): 3.1 cm^2. Valve area (Vmax): 3.11 cm^2. Valve area   (Vmean): 3.04 cm^2. - Mitral valve: Transvalvular velocity was within the normal range.   There was no evidence for stenosis. There was no regurgitation. - Left atrium: The appendage was moderately dilated. - Right ventricle: The cavity size was mildly dilated. Wall   thickness was normal. Systolic function was normal. - Tricuspid valve: There was mild regurgitation. - Pulmonary arteries: Systolic pressure was within the normal   range. PA peak pressure: 35 mm Hg (S). - Inferior vena cava: The vessel was normal in size. The   respirophasic diameter changes were in the normal range (= 50%),   consistent with normal central venous pressure.  Exercise Myoview 03/19/16:  Nuclear stress EF: 59%. No wall motion abnormality The left ventricular ejection fraction is normal (55-65%). There was no ST segment deviation noted during stress. There were frequent PVCs during exercise, rare couplet This is a low risk perfusion study, no ischemia identified. Question if frequent PVCs are contributing to dyspnea on exertion. Fair exercise effort of 6 minutes and 13 seconds.  Recent Labs: 04/25/2021: BUN 19; Creatinine, Ser 1.18; Potassium 5.0; Sodium 129   02/16/2018: Total cholesterol 135, triglycerides 185, HDL 42, LDL 55 Potassium 4.3, creatinine 1.03 ALT 9.0  Lipid Panel    Component Value Date/Time   CHOL  10/17/2007 0540    118        ATP III CLASSIFICATION:  <200     mg/dL   Desirable  200-239  mg/dL   Borderline High  >=240    mg/dL   High   TRIG 101 10/17/2007 0540   HDL 28 (L) 10/17/2007 0540   CHOLHDL 4.2 10/17/2007 0540   VLDL 20 10/17/2007 0540   LDLCALC  10/17/2007 0540    70        Total Cholesterol/HDL:CHD Risk Coronary Heart Disease Risk Table                     Men   Women  1/2 Average Risk   3.4   3.3      Wt Readings from Last 3 Encounters:  10/14/21 230 lb 4.8 oz (104.5 kg)  07/24/21 228 lb  (103.4 kg)  04/18/21 224 lb 6.4 oz (101.8 kg)      ASSESSMENT AND PLAN:  CAD in native artery He is doing well and has no angina.  Continue clopidogrel and atorvastatin.  He is not on a beta-blocker due to bradycardia.  Chronic diastolic heart failure ([place]) Well compensated.  He does have minimal lower extremity edema that I attribute more to amlodipine and venous insufficiency than heart failure.  He has no heart failure symptoms.  Blood pressure is well-controlled.  Continue Lasix.  Essential hypertension Blood pressure is reasonably well controlled.  Given that he has low diastolic blood pressures and bradycardia in the 40s and 50s, we will not push his medications any harder.  Continue amlodipine, doxazosin, hydralazine, furosemide, spironolactone, valsartan/HCTZ.  Renal function is stable despite multiple diuretics.  Hyperlipidemia LDL was 44 on 04/2021.  Continue atorvastatin.  Bradycardia His heart rate has been in the 40s and 50s.  However he is asymptomatic.  Avoid nodal agents.  No indication for PPM at this time.     Current medicines are reviewed at length with the patient today.  The patient does not have concerns regarding medicines.  The following changes have been made: Increase hydralazine.  Labs/ tests ordered today include:   No orders of the defined types were placed in this encounter.    Disposition:   FU with Ossie Yebra C. Oval Linsey, MD, [facility] in 6 months.  I,Mathew Stumpf,acting as a Education administrator for Skeet Latch, MD.,have documented all relevant documentation on the behalf of Skeet Latch, MD,as directed by  Skeet Latch, MD while in the presence of Skeet Latch, MD.  I, [place] Oval Linsey, MD have reviewed all documentation for this visit.  The documentation of the exam, diagnosis, procedures, and orders on 10/14/2021 are all accurate and complete.   Signed, Oluwatosin Higginson C. Oval Linsey, MD, [facility]  10/14/2021 1:09 PM    [place]

## 2021-10-15 ENCOUNTER — Telehealth: Payer: Self-pay

## 2021-10-15 ENCOUNTER — Other Ambulatory Visit: Payer: Self-pay | Admitting: Physician Assistant

## 2021-10-15 MED ORDER — TRAMADOL HCL 50 MG PO TABS: ORAL_TABLET | ORAL | 0 refills | Status: DC

## 2021-10-15 NOTE — Telephone Encounter (Signed)
Refill on Tramadol [facility]

## 2021-11-08 ENCOUNTER — Telehealth: Payer: Self-pay | Admitting: Physical Medicine and Rehabilitation

## 2021-11-08 NOTE — Telephone Encounter (Signed)
Pt calling about getting bil hip injections?    [phone]

## 2021-11-13 DIAGNOSIS — M25552 Pain in left hip: Secondary | ICD-10-CM | POA: Diagnosis not present

## 2021-11-13 DIAGNOSIS — K219 Gastro-esophageal reflux disease without esophagitis: Secondary | ICD-10-CM | POA: Diagnosis not present

## 2021-11-13 DIAGNOSIS — J45991 Cough variant asthma: Secondary | ICD-10-CM | POA: Diagnosis not present

## 2021-11-13 DIAGNOSIS — E1122 Type 2 diabetes mellitus with diabetic chronic kidney disease: Secondary | ICD-10-CM | POA: Diagnosis not present

## 2021-11-13 DIAGNOSIS — M25551 Pain in right hip: Secondary | ICD-10-CM | POA: Diagnosis not present

## 2021-11-13 DIAGNOSIS — Z7984 Long term (current) use of oral hypoglycemic drugs: Secondary | ICD-10-CM | POA: Diagnosis not present

## 2021-11-13 DIAGNOSIS — I2510 Atherosclerotic heart disease of native coronary artery without angina pectoris: Secondary | ICD-10-CM | POA: Diagnosis not present

## 2021-11-13 DIAGNOSIS — I10 Essential (primary) hypertension: Secondary | ICD-10-CM | POA: Diagnosis not present

## 2021-11-26 ENCOUNTER — Other Ambulatory Visit: Payer: Self-pay

## 2021-11-26 ENCOUNTER — Encounter: Payer: Self-pay | Admitting: Physical Medicine and Rehabilitation

## 2021-11-26 ENCOUNTER — Ambulatory Visit: Payer: Medicare PPO | Admitting: Physical Medicine and Rehabilitation

## 2021-11-26 ENCOUNTER — Ambulatory Visit: Payer: Self-pay

## 2021-11-26 DIAGNOSIS — M25551 Pain in right hip: Secondary | ICD-10-CM | POA: Diagnosis not present

## 2021-11-26 DIAGNOSIS — M25552 Pain in left hip: Secondary | ICD-10-CM | POA: Diagnosis not present

## 2021-11-26 NOTE — Progress Notes (Signed)
° °  Jeff Watkins - 86 y.o. male MRN [id number]  Date of birth: Dec 21, 1935  Office Visit Note: Visit Date: 11/26/2021 PCP: Lorenda Ishihara, MD Referred by: Lorenda Ishihara,*  Subjective: Chief Complaint  Patient presents with   Right Hip - Pain   Left Hip - Pain   HPI:  Jeff Watkins is a 86 y.o. male who comes in today for planned repeat Bilateral anesthetic hip arthrogram with fluoroscopic guidance.  The patient has failed conservative care including home exercise, medications, time and activity modification. Prior injection gave more than 50% relief for several months. This injection will be diagnostic and hopefully therapeutic.  Please see requesting physician notes for further details and justification.  He is actually having more posterior hip pain and some back pain.  No prior lumbar spine MRI.  No history of lumbar surgery.  Last injection did seem to help him diagnostically.  If he does not get much relief would consider myself for Rexene Edison, P.A.-C obtaining lumbar spine MRI.  Referring: Rexene Edison, PA-C   ROS Otherwise per HPI.  Assessment & Plan: Visit Diagnoses:    ICD-10-CM   1. Pain in left hip  M25.552 Large Joint Inj: bilateral hip joint    XR C-ARM NO REPORT    2. Pain in right hip  M25.551 Large Joint Inj: bilateral hip joint      Plan: No additional findings.   Meds & Orders: No orders of the defined types were placed in this encounter.   Orders Placed This Encounter  Procedures   Large Joint Inj: bilateral hip joint   XR C-ARM NO REPORT    Follow-up: Return for visit to requesting provider as needed.   Procedures: Large Joint Inj: bilateral hip joint on 11/26/2021 1:15 PM Indications: diagnostic evaluation and pain Details: 22 G 3.5 in needle, fluoroscopy-guided anterior approach  Arthrogram: No  Outcome: tolerated well, no immediate complications  There was excellent flow of contrast producing a partial arthrogram of the hip. The patient  did have relief of symptoms during the anesthetic phase of the injection. Procedure, treatment alternatives, risks and benefits explained, specific risks discussed. Consent was given by the patient. Immediately prior to procedure a time out was called to verify the correct patient, procedure, equipment, support staff and site/side marked as required. Patient was prepped and draped in the usual sterile fashion.         Clinical History: No specialty comments available.     Objective:  VS:  HT:     WT:    BMI:      BP:    HR: bpm   TEMP: ( )   RESP:  Physical Exam   Imaging: No results found.

## 2021-11-26 NOTE — Progress Notes (Signed)
Pt state both hip pain. Pt state getting up in the morning and walking makes the pain worse. Pt state he takes pain meds to help ease his pain.  Numeric Pain Rating Scale and Functional Assessment Average Pain 5   In the last MONTH (on 0-10 scale) has pain interfered with the following?  1. General activity like being  able to carry out your everyday physical activities such as walking, climbing stairs, carrying groceries, or moving a chair?  Rating(9)  +BT, -Dye Allergies.

## 2021-11-27 ENCOUNTER — Other Ambulatory Visit: Payer: Self-pay | Admitting: Physician Assistant

## 2021-12-03 DIAGNOSIS — H43811 Vitreous degeneration, right eye: Secondary | ICD-10-CM | POA: Diagnosis not present

## 2021-12-03 DIAGNOSIS — H1045 Other chronic allergic conjunctivitis: Secondary | ICD-10-CM | POA: Diagnosis not present

## 2021-12-03 DIAGNOSIS — Z961 Presence of intraocular lens: Secondary | ICD-10-CM | POA: Diagnosis not present

## 2021-12-03 DIAGNOSIS — H02834 Dermatochalasis of left upper eyelid: Secondary | ICD-10-CM | POA: Diagnosis not present

## 2021-12-03 DIAGNOSIS — H02831 Dermatochalasis of right upper eyelid: Secondary | ICD-10-CM | POA: Diagnosis not present

## 2021-12-03 DIAGNOSIS — H04123 Dry eye syndrome of bilateral lacrimal glands: Secondary | ICD-10-CM | POA: Diagnosis not present

## 2021-12-03 DIAGNOSIS — E119 Type 2 diabetes mellitus without complications: Secondary | ICD-10-CM | POA: Diagnosis not present

## 2021-12-18 DIAGNOSIS — R052 Subacute cough: Secondary | ICD-10-CM | POA: Diagnosis not present

## 2022-02-04 DIAGNOSIS — J449 Chronic obstructive pulmonary disease, unspecified: Secondary | ICD-10-CM | POA: Diagnosis not present

## 2022-02-04 DIAGNOSIS — Z7722 Contact with and (suspected) exposure to environmental tobacco smoke (acute) (chronic): Secondary | ICD-10-CM | POA: Diagnosis not present

## 2022-02-04 DIAGNOSIS — Z823 Family history of stroke: Secondary | ICD-10-CM | POA: Diagnosis not present

## 2022-02-04 DIAGNOSIS — I110 Hypertensive heart disease with heart failure: Secondary | ICD-10-CM | POA: Diagnosis not present

## 2022-02-04 DIAGNOSIS — E785 Hyperlipidemia, unspecified: Secondary | ICD-10-CM | POA: Diagnosis not present

## 2022-02-04 DIAGNOSIS — I2510 Atherosclerotic heart disease of native coronary artery without angina pectoris: Secondary | ICD-10-CM | POA: Diagnosis not present

## 2022-02-04 DIAGNOSIS — Z7902 Long term (current) use of antithrombotics/antiplatelets: Secondary | ICD-10-CM | POA: Diagnosis not present

## 2022-02-04 DIAGNOSIS — E119 Type 2 diabetes mellitus without complications: Secondary | ICD-10-CM | POA: Diagnosis not present

## 2022-02-04 DIAGNOSIS — D6869 Other thrombophilia: Secondary | ICD-10-CM | POA: Diagnosis not present

## 2022-02-04 DIAGNOSIS — R32 Unspecified urinary incontinence: Secondary | ICD-10-CM | POA: Diagnosis not present

## 2022-02-04 DIAGNOSIS — Z6835 Body mass index (BMI) 35.0-35.9, adult: Secondary | ICD-10-CM | POA: Diagnosis not present

## 2022-02-04 DIAGNOSIS — Z800 Family history of malignant neoplasm of digestive organs: Secondary | ICD-10-CM | POA: Diagnosis not present

## 2022-02-04 DIAGNOSIS — M1990 Unspecified osteoarthritis, unspecified site: Secondary | ICD-10-CM | POA: Diagnosis not present

## 2022-02-04 DIAGNOSIS — I4891 Unspecified atrial fibrillation: Secondary | ICD-10-CM | POA: Diagnosis not present

## 2022-02-04 DIAGNOSIS — N529 Male erectile dysfunction, unspecified: Secondary | ICD-10-CM | POA: Diagnosis not present

## 2022-02-04 DIAGNOSIS — Z604 Social exclusion and rejection: Secondary | ICD-10-CM | POA: Diagnosis not present

## 2022-02-04 DIAGNOSIS — I509 Heart failure, unspecified: Secondary | ICD-10-CM | POA: Diagnosis not present

## 2022-02-19 DIAGNOSIS — S299XXA Unspecified injury of thorax, initial encounter: Secondary | ICD-10-CM | POA: Diagnosis not present

## 2022-02-19 DIAGNOSIS — S199XXA Unspecified injury of neck, initial encounter: Secondary | ICD-10-CM | POA: Diagnosis not present

## 2022-02-19 DIAGNOSIS — S0990XA Unspecified injury of head, initial encounter: Secondary | ICD-10-CM | POA: Diagnosis not present

## 2022-02-19 DIAGNOSIS — M48061 Spinal stenosis, lumbar region without neurogenic claudication: Secondary | ICD-10-CM | POA: Diagnosis not present

## 2022-02-19 DIAGNOSIS — Y998 Other external cause status: Secondary | ICD-10-CM | POA: Diagnosis not present

## 2022-02-19 DIAGNOSIS — S2231XA Fracture of one rib, right side, initial encounter for closed fracture: Secondary | ICD-10-CM | POA: Diagnosis not present

## 2022-02-19 DIAGNOSIS — I700 Atherosclerosis of aorta: Secondary | ICD-10-CM | POA: Diagnosis not present

## 2022-02-19 DIAGNOSIS — M549 Dorsalgia, unspecified: Secondary | ICD-10-CM | POA: Diagnosis not present

## 2022-02-19 DIAGNOSIS — J988 Other specified respiratory disorders: Secondary | ICD-10-CM | POA: Diagnosis not present

## 2022-02-19 DIAGNOSIS — I6529 Occlusion and stenosis of unspecified carotid artery: Secondary | ICD-10-CM | POA: Diagnosis not present

## 2022-02-19 DIAGNOSIS — K5730 Diverticulosis of large intestine without perforation or abscess without bleeding: Secondary | ICD-10-CM | POA: Diagnosis not present

## 2022-02-19 DIAGNOSIS — Z23 Encounter for immunization: Secondary | ICD-10-CM | POA: Diagnosis not present

## 2022-02-19 DIAGNOSIS — W010XXA Fall on same level from slipping, tripping and stumbling without subsequent striking against object, initial encounter: Secondary | ICD-10-CM | POA: Diagnosis not present

## 2022-02-19 DIAGNOSIS — S0101XA Laceration without foreign body of scalp, initial encounter: Secondary | ICD-10-CM | POA: Diagnosis not present

## 2022-02-19 DIAGNOSIS — R220 Localized swelling, mass and lump, head: Secondary | ICD-10-CM | POA: Diagnosis not present

## 2022-02-19 DIAGNOSIS — G319 Degenerative disease of nervous system, unspecified: Secondary | ICD-10-CM | POA: Diagnosis not present

## 2022-02-19 DIAGNOSIS — S3991XA Unspecified injury of abdomen, initial encounter: Secondary | ICD-10-CM | POA: Diagnosis not present

## 2022-02-19 DIAGNOSIS — S0993XA Unspecified injury of face, initial encounter: Secondary | ICD-10-CM | POA: Diagnosis not present

## 2022-02-19 DIAGNOSIS — M40202 Unspecified kyphosis, cervical region: Secondary | ICD-10-CM | POA: Diagnosis not present

## 2022-02-19 DIAGNOSIS — M419 Scoliosis, unspecified: Secondary | ICD-10-CM | POA: Diagnosis not present

## 2022-02-19 DIAGNOSIS — R140 Abdominal distension (gaseous): Secondary | ICD-10-CM | POA: Diagnosis not present

## 2022-02-19 DIAGNOSIS — M5136 Other intervertebral disc degeneration, lumbar region: Secondary | ICD-10-CM | POA: Diagnosis not present

## 2022-02-19 DIAGNOSIS — I6381 Other cerebral infarction due to occlusion or stenosis of small artery: Secondary | ICD-10-CM | POA: Diagnosis not present

## 2022-02-19 DIAGNOSIS — M7989 Other specified soft tissue disorders: Secondary | ICD-10-CM | POA: Diagnosis not present

## 2022-02-19 DIAGNOSIS — I2510 Atherosclerotic heart disease of native coronary artery without angina pectoris: Secondary | ICD-10-CM | POA: Diagnosis not present

## 2022-02-19 DIAGNOSIS — J8410 Pulmonary fibrosis, unspecified: Secondary | ICD-10-CM | POA: Diagnosis not present

## 2022-02-19 DIAGNOSIS — M47816 Spondylosis without myelopathy or radiculopathy, lumbar region: Secondary | ICD-10-CM | POA: Diagnosis not present

## 2022-02-19 DIAGNOSIS — G9389 Other specified disorders of brain: Secondary | ICD-10-CM | POA: Diagnosis not present

## 2022-02-19 DIAGNOSIS — S0181XA Laceration without foreign body of other part of head, initial encounter: Secondary | ICD-10-CM | POA: Diagnosis not present

## 2022-02-19 DIAGNOSIS — K5732 Diverticulitis of large intestine without perforation or abscess without bleeding: Secondary | ICD-10-CM | POA: Diagnosis not present

## 2022-02-19 DIAGNOSIS — M47812 Spondylosis without myelopathy or radiculopathy, cervical region: Secondary | ICD-10-CM | POA: Diagnosis not present

## 2022-02-28 ENCOUNTER — Telehealth: Payer: Self-pay | Admitting: Orthopaedic Surgery

## 2022-02-28 ENCOUNTER — Other Ambulatory Visit: Payer: Self-pay | Admitting: Physician Assistant

## 2022-02-28 NOTE — Telephone Encounter (Signed)
Please advise 

## 2022-02-28 NOTE — Telephone Encounter (Signed)
Patient called. He would like a refill on Tramadol. His call back number is [phone] ?

## 2022-03-20 ENCOUNTER — Other Ambulatory Visit: Payer: Self-pay | Admitting: Cardiovascular Disease

## 2022-03-20 NOTE — Telephone Encounter (Signed)
Rx(s) sent to pharmacy electronically.  

## 2022-03-21 ENCOUNTER — Telehealth: Payer: Self-pay | Admitting: Physical Medicine and Rehabilitation

## 2022-03-21 NOTE — Telephone Encounter (Signed)
Pt called toset an appt. Please call pt at [phone]

## 2022-04-01 ENCOUNTER — Other Ambulatory Visit (HOSPITAL_BASED_OUTPATIENT_CLINIC_OR_DEPARTMENT_OTHER): Payer: Self-pay | Admitting: Cardiovascular Disease

## 2022-04-01 NOTE — Telephone Encounter (Signed)
Rx request sent to pharmacy.  

## 2022-04-11 ENCOUNTER — Encounter (HOSPITAL_BASED_OUTPATIENT_CLINIC_OR_DEPARTMENT_OTHER): Payer: Self-pay | Admitting: Cardiovascular Disease

## 2022-04-11 ENCOUNTER — Telehealth: Payer: Self-pay

## 2022-04-11 ENCOUNTER — Ambulatory Visit (HOSPITAL_BASED_OUTPATIENT_CLINIC_OR_DEPARTMENT_OTHER): Payer: Medicare PPO | Admitting: Family

## 2022-04-11 VITALS — BP 128/60 | HR 51 | Ht 68.0 in | Wt 232.2 lb

## 2022-04-11 DIAGNOSIS — I10 Essential (primary) hypertension: Secondary | ICD-10-CM

## 2022-04-11 DIAGNOSIS — R001 Bradycardia, unspecified: Secondary | ICD-10-CM | POA: Diagnosis not present

## 2022-04-11 DIAGNOSIS — I5032 Chronic diastolic (congestive) heart failure: Secondary | ICD-10-CM | POA: Diagnosis not present

## 2022-04-11 DIAGNOSIS — E1122 Type 2 diabetes mellitus with diabetic chronic kidney disease: Secondary | ICD-10-CM | POA: Diagnosis not present

## 2022-04-11 DIAGNOSIS — E038 Other specified hypothyroidism: Secondary | ICD-10-CM | POA: Diagnosis not present

## 2022-04-11 DIAGNOSIS — R0609 Other forms of dyspnea: Secondary | ICD-10-CM

## 2022-04-11 DIAGNOSIS — R5383 Other fatigue: Secondary | ICD-10-CM | POA: Diagnosis not present

## 2022-04-11 DIAGNOSIS — J45991 Cough variant asthma: Secondary | ICD-10-CM | POA: Diagnosis not present

## 2022-04-11 NOTE — Patient Instructions (Signed)
Medication Instructions:  Your physician has recommended you make the following change in your medication:   INCREASE to Furosemide to 2 tablet daily for 3 days THEN return to 1 tablet daily    *If you need a refill on your cardiac medications before your next appointment, please call your pharmacy*   Lab Work: Your physician recommends that you return for lab work Monday: CBC, BMP, TSH, BNP  If you have labs (blood work) drawn today and your tests are completely normal, you will receive your results only by: MyChart Message (if you have MyChart) OR A paper copy in the mail If you have any lab test that is abnormal or we need to change your treatment, we will call you to review the results.   Testing/Procedures: None ordered today.   Follow-Up: At [facility], you and your health needs are our priority.  As part of our continuing mission to provide you with exceptional heart care, we have created designated Provider Care Teams.  These Care Teams include your primary Cardiologist (physician) and Advanced Practice Providers (APPs -  Physician Assistants and Nurse Practitioners) who all work together to provide you with the care you need, when you need it.  We recommend signing up for the patient portal called "MyChart".  Sign up information is provided on this After Visit Summary.  MyChart is used to connect with patients for Virtual Visits (Telemedicine).  Patients are able to view lab/test results, encounter notes, upcoming appointments, etc.  Non-urgent messages can be sent to your provider as well.   To learn more about what you can do with MyChart, go to [email or web address].    Your next appointment:   3 month(s)  The format for your next appointment:   In Person  Provider:   [name], MD or Alver Sorrow, NP     Other Instructions  Heart Healthy Diet Recommendations: A low-salt diet is recommended. Meats should be grilled, baked, or boiled. Avoid fried  foods. Focus on lean protein sources like fish or chicken with vegetables and fruits. The American Heart Association is a Chief Technology Officer!  American Heart Association Diet and Lifeystyle Recommendations   Exercise recommendations: The American Heart Association recommends 150 minutes of moderate intensity exercise weekly. Try 30 minutes of moderate intensity exercise 4-5 times per week. This could include walking, jogging, or swimming.  Important Information About Sugar

## 2022-04-11 NOTE — Telephone Encounter (Signed)
Call to pt reference PREP referral He is currently having hip pain and he is unsure if he can do the program He is scheduled to have injections on the 28th He is agreeable to me calling back in a month and see if the injections have made his pain better

## 2022-04-11 NOTE — Progress Notes (Signed)
Office Visit    Patient Name: Jeff Watkins Date of Encounter: 04/11/2022  PCP:  Lorenda Ishihara, MD   [facility]  Cardiologist:  [name], MD  Advanced Practice Provider:  No care team member to display Electrophysiologist:  None    Chief Complaint    Jeff Watkins is a 86 y.o. male with a hx of moderate CAD, HTN, HLD, OSA, CVA, GERD, DM2 presents today for follow-up of hypertension and bradycardia  Past Medical History    Past Medical History:  Diagnosis Date   Arthritis    Asthma    Bradycardia 10/14/2021   Bronchitis    CAD in native artery 04/18/2021   Carpal tunnel syndrome, bilateral    Chronic diastolic heart failure (HCC) 03/07/2020   Coronary artery disease    50-70% LAD by 2001 cath   Essential hypertension 02/25/2016   GERD (gastroesophageal reflux disease)    Hypercholesteremia    Hyperlipidemia 02/25/2016   Hypertension    Obesity (BMI 30-39.9) 02/25/2016   OSA (obstructive sleep apnea) 04/18/2021   Pre-diabetes    Shortness of breath 02/25/2016   Sleep apnea    does not use CPAP   Stroke (HCC) ~2010   mini stroke    Stroke (HCC) 04/18/2021   Past Surgical History:  Procedure Laterality Date   CARDIAC CATHETERIZATION  2001   CARPAL TUNNEL RELEASE Right 07/06/2018   Procedure: RIGHT CARPAL TUNNEL RELEASE, EXTENDED EXCISION;  Surgeon: Cindee Salt, MD;  Location: [facility];  Service: Orthopedics;  Laterality: Right;   CARPAL TUNNEL RELEASE Left 04/12/2019   Procedure: CARPAL TUNNEL RELEASE;  Surgeon: Cindee Salt, MD;  Location: [facility];  Service: Orthopedics;  Laterality: Left;   COLONOSCOPY WITH PROPOFOL N/A 05/21/2015   Procedure: COLONOSCOPY WITH PROPOFOL;  Surgeon: [name], MD;  Location: [facility];  Service: Endoscopy;  Laterality: N/A;   EYE SURGERY     bilateral cataracts   FRACTURE SURGERY  1966   compound arm   HERNIA REPAIR     LAPAROSCOPIC APPENDECTOMY N/A 10/18/2013    Procedure: APPENDECTOMY LAPAROSCOPIC;  Surgeon: [name], MD;  Location: [facility];  Service: General;  Laterality: N/A;   SUBMANDIBULAR GLAND EXCISION Left 09/02/2017    Left submandibular gland and associated soft tissue and lymph nodes   SUBMANDIBULAR GLAND EXCISION Left 09/02/2017   Procedure: EXCISION LEFT SUBMANDIBULAR GLAND;  Surgeon: Serena Colonel, MD;  Location: [facility];  Service: ENT;  Laterality: Left;   TESTICLE REMOVAL  1965    Allergies  Allergies  Allergen Reactions   Elemental Sulfur Hives    In genital area accompanied by blisters   Penicillins Rash    History of Present Illness    Jeff Watkins is a 86 y.o. male with a hx of moderate CAD, HTN, HLD, OSA, CVA, GERD, DM2  last seen 04/18/2021 by Dr. Duke Salvia.  Seen 04/18/21 with shortness of breath and easy fatigue. Previously intolerant of CPAP. Carvedilol was discontinued due to bradycardia.   He presents today for follow-up.  Notes he lost his wife 2 weeks ago somewhat unexpectedly.  She had had dementia for many years.  Offered my condolences.  He does have good community through his local church, [employer].  He does have a son who lives close by who is a paraplegic though is able to live independently and drives with a handicapped Zenaida Niece. Reports no shortness of breath nor dyspnea on exertion. Reports no  chest pain, pressure, or tightness. No edema, orthopnea, PND. Reports no palpitations.  No lightheadedness, dizziness, near-syncope, syncope  EKGs/Labs/Other Studies Reviewed:   The following studies were reviewed today: Echo 03/15/2020: 1. Left ventricular ejection fraction, by estimation, is 60 to 65%. The  left ventricle has normal function. The left ventricle has no regional  wall motion abnormalities. Left ventricular diastolic parameters are  consistent with Grade II diastolic  dysfunction (pseudonormalization).   2. Right ventricular systolic function is normal. The right ventricular  size is normal. There is  mildly elevated pulmonary artery systolic  pressure.   3. Left atrial size was mildly dilated.   4. The mitral valve is normal in structure. No evidence of mitral valve  regurgitation. No evidence of mitral stenosis.   5. The aortic valve is tricuspid. Aortic valve regurgitation is mild. No  aortic stenosis is present.   6. Aortic dilatation noted. There is mild dilatation of the ascending  aorta measuring 42 mm.   7. The inferior vena cava is normal in size with greater than 50%  respiratory variability, suggesting right atrial pressure of 3 mmHg.   Myocardial Amyloid Imaging Planar and Spect 03/14/2020: The study is normal.   The study is normal. The study is not suggestive of TTR amyloidosis (visual score of 0 or H/CL ratio<1).   Renal Artery Duplex 03/14/2020: Summary:  Renal:     Right: Normal size right kidney. Normal right Resisitive Index.         Normal cortical thickness of right kidney. No evidence of         right renal artery stenosis. RRV flow present.  Left:  Normal size of left kidney. Abnormal left Resisitve Index.         Normal cortical thickness of the left kidney. No evidence of         left renal artery stenosis. LRV flow present. Cyst(s) noted.         measuring 3.8 cm 3.8 cm.  Mesenteric:  Normal Celiac artery and Superior Mesenteric artery findings.    Echo 03/19/16: Study Conclusions   - Left ventricle: The cavity size was normal. There was   mild-moderate concentric hypertrophy. Systolic function was   normal. The estimated ejection fraction was in the range of 55%   to 60%. Wall motion was normal; there were no regional wall   motion abnormalities. Features are consistent with a pseudonormal   left ventricular filling pattern, with concomitant abnormal   relaxation and increased filling pressure (grade 2 diastolic   dysfunction). Doppler parameters are consistent with   indeterminate ventricular filling pressure. - Aortic valve: Transvalvular  velocity was within the normal range.   There was no stenosis. There was no regurgitation. Valve area   (VTI): 3.1 cm^2. Valve area (Vmax): 3.11 cm^2. Valve area   (Vmean): 3.04 cm^2. - Mitral valve: Transvalvular velocity was within the normal range.   There was no evidence for stenosis. There was no regurgitation. - Left atrium: The appendage was moderately dilated. - Right ventricle: The cavity size was mildly dilated. Wall   thickness was normal. Systolic function was normal. - Tricuspid valve: There was mild regurgitation. - Pulmonary arteries: Systolic pressure was within the normal   range. PA peak pressure: 35 mm Hg (S). - Inferior vena cava: The vessel was normal in size. The   respirophasic diameter changes were in the normal range (= 50%),   consistent with normal central venous pressure.   Exercise Myoview 03/19/16:  Nuclear stress EF: 59%. No wall motion abnormality The left ventricular ejection fraction is normal (55-65%). There was no ST segment deviation noted during stress. There were frequent PVCs during exercise, rare couplet This is a low risk perfusion study, no ischemia identified. Question if frequent PVCs are contributing to dyspnea on exertion. Fair exercise effort of 6 minutes and 13 seconds.   EKG:  EKG is ordered today.  The ekg performed today demonstrates SB 51 bpm with no acute ST/T wave changes.   Recent Labs: 04/25/2021: BUN 19; Creatinine, Ser 1.18; Potassium 5.0; Sodium 129  Recent Lipid Panel    Component Value Date/Time   CHOL  10/17/2007 0540    118        ATP III CLASSIFICATION:  <200     mg/dL   Desirable  329-518  mg/dL   Borderline High  >=841    mg/dL   High   TRIG 660 63/10/6008 0540   HDL 28 (L) 10/17/2007 0540   CHOLHDL 4.2 10/17/2007 0540   VLDL 20 10/17/2007 0540   LDLCALC  10/17/2007 0540    70        Total Cholesterol/HDL:CHD Risk Coronary Heart Disease Risk Table                     Men   Women  1/2 Average Risk   3.4    3.3    Home Medications   Current Meds  Medication Sig   albuterol (PROVENTIL HFA;VENTOLIN HFA) 108 (90 BASE) MCG/ACT inhaler Inhale 1-2 puffs into the lungs every 6 (six) hours as needed for wheezing or shortness of breath. Reported on 05/08/2016   amLODipine (NORVASC) 10 MG tablet Take 10 mg by mouth at bedtime.    atorvastatin (LIPITOR) 40 MG tablet Take 40 mg by mouth at bedtime.    clopidogrel (PLAVIX) 75 MG tablet Take 75 mg by mouth daily with breakfast.   COVID-19 mRNA Vac-TriS, Pfizer, (PFIZER-BIONT COVID-19 VAC-TRIS) SUSP injection Inject into the muscle.   doxazosin (CARDURA) 8 MG tablet TAKE 1 TABLET BY MOUTH DAILY   ferrous sulfate 325 (65 FE) MG tablet Take 325 mg by mouth daily with breakfast.   Fish Oil-Cholecalciferol (FISH OIL + D3 PO) Take 1 capsule by mouth every morning.    furosemide (LASIX) 20 MG tablet TAKE ONE (1) TABLET BY MOUTH EVERY DAY   hydrALAZINE (APRESOLINE) 100 MG tablet TAKE ONE (1) TABLET BY MOUTH TWO (2) TIMES DAILY   loratadine (CLARITIN) 10 MG tablet Take 10 mg by mouth every morning.    Multiple Vitamin (MULTI VITAMIN MENS PO) Take 1 tablet by mouth every evening.    pantoprazole (PROTONIX) 20 MG tablet TAKE ONE (1) TABLET EACH DAY   Polyethyl Glycol-Propyl Glycol (SYSTANE OP) Place 1 drop into both eyes at bedtime.    spironolactone (ALDACTONE) 25 MG tablet TAKE 1 TABLET BY MOUTH DAILY   Testosterone 20.25 MG/1.25GM (1.62%) GEL Place 2 Pump onto the skin every other day.   traMADol (ULTRAM) 50 MG tablet TAKE ONE (1) TABLET BY MOUTH EVERY 6 HOURS AS NEEDED   valsartan-hydrochlorothiazide (DIOVAN-HCT) 320-25 MG tablet Take 1 tablet by mouth daily.     Review of Systems      All other systems reviewed and are otherwise negative except as noted above.  Physical Exam    VS:  BP 128/60 (BP Location: Left Arm, Patient Position: Sitting, Cuff Size: Normal)   Pulse (!) 51   Ht 5\' 8"  (1.727 m)  Wt 232 lb 3.2 oz (105.3 kg)   SpO2 92%   BMI 35.31  kg/m  , BMI Body mass index is 35.31 kg/m.  Wt Readings from Last 3 Encounters:  04/11/22 232 lb 3.2 oz (105.3 kg)  10/14/21 230 lb 4.8 oz (104.5 kg)  07/24/21 228 lb (103.4 kg)     GEN: Well nourished, well developed, in no acute distress. HEENT: normal. Neck: Supple, no JVD, carotid bruits, or masses. Cardiac: Bradycardic, RRR, no murmurs, rubs, or gallops. No clubbing, cyanosis, edema.  Radials/PT 2+ and equal bilaterally.  Respiratory:  Respirations regular and unlabored, clear to auscultation bilaterally. GI: Soft, nontender, nondistended. MS: No deformity or atrophy. Skin: Warm and dry, no rash. Neuro:  Strength and sensation are intact. Psych: Normal affect.  Assessment & Plan    Bradycardia -persistent bradycardia heart rate today 53 bpm despite discontinuation of carvedilol.  No lightheadedness, dizziness, near-syncope, syncope.  To rule out high-grade AV block or significant pause we will plan for 7-day ZIO monitor which was placed in clinic today.  Chronic diastolic heart failure - Euvolemic and well compensated on exam.  GDMT includes spironolactone, hydralazine, Lasix.  Grief-lost his wife 2 weeks ago and offered my condolences.   Offered referral to psychology for grief counseling which he politely declines.  He does have good support through his local church.  OSA -intolerant of CPAP due to claustrophobia.  Hyperlipidemia-continue atorvastatin.  CAD -nonobstructive coronary artery disease. Stable with no anginal symptoms. No indication for ischemic evaluation.  Continue atorvastatin, clopidogrel.  No beta-blocker due to bradycardia. Heart healthy diet and regular cardiovascular exercise encouraged.    History of CVA - Occurred around 2009.  Continue atorvastatin, clopidogrel, blood pressure control.  Disposition: Follow up in 3 month(s) ***with Dr. Oval Linsey or APP.  Signed, Loel Dubonnet, NP 04/11/2022, 4:52 PM [facility]

## 2022-04-12 LAB — BASIC METABOLIC PANEL
BUN/Creatinine Ratio: 19 (ref 10–24)
BUN: 23 mg/dL (ref 8–27)
CO2: 22 mmol/L (ref 20–29)
Calcium: 9.2 mg/dL (ref 8.6–10.2)
Chloride: 99 mmol/L (ref 96–106)
Creatinine, Ser: 1.22 mg/dL (ref 0.76–1.27)
Glucose: 105 mg/dL — ABNORMAL HIGH (ref 70–99)
Potassium: 4.8 mmol/L (ref 3.5–5.2)
Sodium: 135 mmol/L (ref 134–144)
eGFR: 58 mL/min/{1.73_m2} — ABNORMAL LOW (ref >59)

## 2022-04-12 LAB — CBC
Hematocrit: 31.8 % — ABNORMAL LOW (ref 37.5–51.0)
Hemoglobin: 10.6 g/dL — ABNORMAL LOW (ref 13.0–17.7)
MCH: 32.2 pg (ref 26.6–33.0)
MCHC: 33.3 g/dL (ref 31.5–35.7)
MCV: 97 fL (ref 79–97)
Platelets: 191 10*3/uL (ref 150–450)
RBC: 3.29 x10E6/uL — ABNORMAL LOW (ref 4.14–5.80)
RDW: 12.3 % (ref 11.6–15.4)
WBC: 4.1 10*3/uL (ref 3.4–10.8)

## 2022-04-12 LAB — BRAIN NATRIURETIC PEPTIDE: BNP: 328.1 pg/mL — ABNORMAL HIGH (ref 0.0–100.0)

## 2022-04-12 LAB — TSH: TSH: 2.56 u[IU]/mL (ref 0.450–4.500)

## 2022-04-13 ENCOUNTER — Encounter (HOSPITAL_BASED_OUTPATIENT_CLINIC_OR_DEPARTMENT_OTHER): Payer: Self-pay | Admitting: Family

## 2022-04-16 ENCOUNTER — Telehealth (HOSPITAL_BASED_OUTPATIENT_CLINIC_OR_DEPARTMENT_OTHER): Payer: Self-pay | Admitting: Family

## 2022-04-16 DIAGNOSIS — D649 Anemia, unspecified: Secondary | ICD-10-CM

## 2022-04-16 NOTE — Telephone Encounter (Signed)
Follow Up:     Patient is returning a call, concerning his lab results. 

## 2022-04-16 NOTE — Telephone Encounter (Signed)
-----   Message from Alver Sorrow, NP sent at 04/13/2022  5:58 PM EDT ----- Stable kidney function. Normal electrolytes. CBC with no evidence of infection. CBC does show anemia. BNP shows volume overload. Normal thyroid function.   He did additional Lasix over the weekend. Return to Lasix 20mg  QD. Please weigh daily and report weight gain of 2 lbs overweight or 5 lbs in one week. If still with dyspnea, may increase to 40mg  QD x 2 days then return to 20mg  QD.  Concern anemia is contributory to fatigue and dyspnea. Recommend prompt follow up with PCP and referral to GI.

## 2022-04-16 NOTE — Telephone Encounter (Signed)
Advised patient of lab results and referral placed for GI Advised to follow up with PCP and sent labs

## 2022-04-16 NOTE — Telephone Encounter (Signed)
Follow Up:    Patient called back, he said please call him on his cell phone.

## 2022-04-23 ENCOUNTER — Ambulatory Visit: Payer: Medicare PPO | Admitting: Physical Medicine and Rehabilitation

## 2022-04-23 ENCOUNTER — Ambulatory Visit: Payer: Self-pay

## 2022-04-23 ENCOUNTER — Encounter: Payer: Self-pay | Admitting: Physical Medicine and Rehabilitation

## 2022-04-23 DIAGNOSIS — M25552 Pain in left hip: Secondary | ICD-10-CM

## 2022-04-23 DIAGNOSIS — M25551 Pain in right hip: Secondary | ICD-10-CM

## 2022-04-23 NOTE — Progress Notes (Signed)
Pt state both hip pain. Pt state any movement makes the pain worse. Pt state he takes over the counter pain meds to help ease his pain.  Numeric Pain Rating Scale and Functional Assessment Average Pain 7   In the last MONTH (on 0-10 scale) has pain interfered with the following?  1. General activity like being  able to carry out your everyday physical activities such as walking, climbing stairs, carrying groceries, or moving a chair?  Rating(10)   +BT, -Dye Allergies.

## 2022-05-14 DIAGNOSIS — I2510 Atherosclerotic heart disease of native coronary artery without angina pectoris: Secondary | ICD-10-CM | POA: Diagnosis not present

## 2022-05-14 DIAGNOSIS — J309 Allergic rhinitis, unspecified: Secondary | ICD-10-CM | POA: Diagnosis not present

## 2022-05-14 DIAGNOSIS — K219 Gastro-esophageal reflux disease without esophagitis: Secondary | ICD-10-CM | POA: Diagnosis not present

## 2022-05-14 DIAGNOSIS — E1122 Type 2 diabetes mellitus with diabetic chronic kidney disease: Secondary | ICD-10-CM | POA: Diagnosis not present

## 2022-05-14 DIAGNOSIS — N1831 Chronic kidney disease, stage 3a: Secondary | ICD-10-CM | POA: Diagnosis not present

## 2022-05-14 DIAGNOSIS — I10 Essential (primary) hypertension: Secondary | ICD-10-CM | POA: Diagnosis not present

## 2022-05-14 DIAGNOSIS — M4722 Other spondylosis with radiculopathy, cervical region: Secondary | ICD-10-CM | POA: Diagnosis not present

## 2022-05-14 DIAGNOSIS — Z0000 Encounter for general adult medical examination without abnormal findings: Secondary | ICD-10-CM | POA: Diagnosis not present

## 2022-05-14 DIAGNOSIS — G4733 Obstructive sleep apnea (adult) (pediatric): Secondary | ICD-10-CM | POA: Diagnosis not present

## 2022-05-20 MED ORDER — BUPIVACAINE HCL 0.25 % IJ SOLN
5.0000 mL | INTRAMUSCULAR | Status: AC | PRN
  Administered 2022-04-23: 5 mL via INTRA_ARTICULAR

## 2022-05-20 MED ORDER — TRIAMCINOLONE ACETONIDE 40 MG/ML IJ SUSP
40.0000 mg | INTRAMUSCULAR | Status: AC | PRN
  Administered 2022-04-23: 40 mg via INTRA_ARTICULAR

## 2022-05-20 NOTE — Progress Notes (Signed)
   Jeff Watkins - 86 y.o. male MRN [id number]  Date of birth: Mar 04, 1936  Office Visit Note: Visit Date: 04/23/2022 PCP: Lorenda Ishihara, MD Referred by: Lorenda Ishihara,*  Subjective: Chief Complaint  Patient presents with   Right Hip - Pain   Left Hip - Pain   HPI:  Jeff Watkins is a 86 y.o. male who comes in today for planned repeat Bilateral anesthetic hip arthrogram with fluoroscopic guidance.  The patient has failed conservative care including home exercise, medications, time and activity modification. Prior injection gave more than 50% relief for several months. This injection will be diagnostic and hopefully therapeutic.  Please see requesting physician notes for further details and justification.  Referring: Rexene Edison, PA-C   ROS Otherwise per HPI.  Assessment & Plan: Visit Diagnoses:    ICD-10-CM   1. Pain in left hip  M25.552 XR C-ARM NO REPORT    2. Pain in right hip  M25.551 XR C-ARM NO REPORT      Plan: No additional findings.   Meds & Orders: No orders of the defined types were placed in this encounter.   Orders Placed This Encounter  Procedures   Large Joint Inj   XR C-ARM NO REPORT    Follow-up: Return for visit to requesting provider as needed.   Procedures: Large Joint Inj: bilateral hip joint on 04/23/2022 3:15 PM Indications: diagnostic evaluation and pain Details: 22 G 3.5 in needle, fluoroscopy-guided anterior approach  Arthrogram: No  Medications (Right): 40 mg triamcinolone acetonide 40 MG/ML; 5 mL bupivacaine 0.25 % Medications (Left): 40 mg triamcinolone acetonide 40 MG/ML; 5 mL bupivacaine 0.25 % Outcome: tolerated well, no immediate complications  There was excellent flow of contrast producing a partial arthrogram of the hip. The patient did have relief of symptoms during the anesthetic phase of the injection. Procedure, treatment alternatives, risks and benefits explained, specific risks discussed. Consent was given by the  patient. Immediately prior to procedure a time out was called to verify the correct patient, procedure, equipment, support staff and site/side marked as required. Patient was prepped and draped in the usual sterile fashion.          Clinical History: No specialty comments available.     Objective:  VS:  HT:    WT:   BMI:     BP:   HR: bpm  TEMP: ( )  RESP:  Physical Exam   Imaging: No results found.

## 2022-05-30 ENCOUNTER — Telehealth: Payer: Self-pay

## 2022-05-30 DIAGNOSIS — Z0000 Encounter for general adult medical examination without abnormal findings: Secondary | ICD-10-CM

## 2022-05-30 NOTE — Telephone Encounter (Signed)
Patient returned call regarding the status of the [employer]ify cuff. Patient stated that he returned the scale and cuff to [employer] a long time ago. Will provide update to Dr. Duke Salvia.  Konnor Vondrasek Nedra Hai, [facility] Guide, Health Coach [address], [address] [place] [address] Telephone: [phone] Email: [email or web address]

## 2022-05-30 NOTE — Telephone Encounter (Signed)
Called patient to determine if he had returned the Vivify cuff after completing the program or if he still has access to it and need to return the device. Left patient a message requesting a call back to provide a status update on the cuff.    Mahalia Dykes Nedra Hai, [facility] Guide, Health Coach [address], [address] [place] [address] Telephone: [phone] Email: [email or web address]

## 2022-06-20 ENCOUNTER — Other Ambulatory Visit: Payer: Self-pay | Admitting: Internal Medicine

## 2022-06-20 ENCOUNTER — Ambulatory Visit
Admission: RE | Admit: 2022-06-20 | Discharge: 2022-06-20 | Disposition: A | Discharge status: Home / Self Care | Payer: Medicare PPO | Source: Ambulatory Visit | Attending: Internal Medicine | Admitting: Internal Medicine

## 2022-06-20 DIAGNOSIS — I2510 Atherosclerotic heart disease of native coronary artery without angina pectoris: Secondary | ICD-10-CM | POA: Diagnosis not present

## 2022-06-20 DIAGNOSIS — I10 Essential (primary) hypertension: Secondary | ICD-10-CM | POA: Diagnosis not present

## 2022-06-20 DIAGNOSIS — E041 Nontoxic single thyroid nodule: Secondary | ICD-10-CM | POA: Diagnosis not present

## 2022-06-20 DIAGNOSIS — R0602 Shortness of breath: Secondary | ICD-10-CM | POA: Diagnosis not present

## 2022-06-20 DIAGNOSIS — E038 Other specified hypothyroidism: Secondary | ICD-10-CM | POA: Diagnosis not present

## 2022-06-20 DIAGNOSIS — D649 Anemia, unspecified: Secondary | ICD-10-CM | POA: Diagnosis not present

## 2022-06-20 DIAGNOSIS — Z79899 Other long term (current) drug therapy: Secondary | ICD-10-CM | POA: Diagnosis not present

## 2022-06-20 DIAGNOSIS — G4733 Obstructive sleep apnea (adult) (pediatric): Secondary | ICD-10-CM | POA: Diagnosis not present

## 2022-07-01 ENCOUNTER — Ambulatory Visit: Payer: Medicare PPO | Admitting: Physical Medicine and Rehabilitation

## 2022-07-01 ENCOUNTER — Ambulatory Visit: Payer: Self-pay

## 2022-07-01 ENCOUNTER — Encounter: Payer: Self-pay | Admitting: Physical Medicine and Rehabilitation

## 2022-07-01 DIAGNOSIS — M25552 Pain in left hip: Secondary | ICD-10-CM

## 2022-07-01 DIAGNOSIS — M25551 Pain in right hip: Secondary | ICD-10-CM

## 2022-07-01 DIAGNOSIS — M1612 Unilateral primary osteoarthritis, left hip: Secondary | ICD-10-CM | POA: Diagnosis not present

## 2022-07-01 DIAGNOSIS — M48062 Spinal stenosis, lumbar region with neurogenic claudication: Secondary | ICD-10-CM

## 2022-07-01 DIAGNOSIS — M1611 Unilateral primary osteoarthritis, right hip: Secondary | ICD-10-CM | POA: Diagnosis not present

## 2022-07-01 NOTE — Progress Notes (Unsigned)
Jeff Watkins - 86 y.o. male MRN [id number]  Date of birth: 06-Apr-1936  Office Visit Note: Visit Date: 07/01/2022 PCP: Lorenda Ishihara, MD Referred by: Lorenda Ishihara,*  Subjective: Chief Complaint  Patient presents with   Right Hip - Pain   Left Hip - Pain   HPI: Jeff Watkins is a 86 y.o. male who comes in todayHPI  {Oswestry Disability Score:26558}  ROS Otherwise per HPI.  Assessment & Plan: Visit Diagnoses: No diagnosis found.   Plan: No additional findings.   Meds & Orders: No orders of the defined types were placed in this encounter.  No orders of the defined types were placed in this encounter.   Follow-up: No follow-ups on file.   Procedures: Large Joint Inj: bilateral hip joint on 07/01/2022 1:06 PM Indications: diagnostic evaluation and pain Details: 22 G 3.5 in needle, fluoroscopy-guided anterior approach  Arthrogram: No  Medications (Right): 40 mg triamcinolone acetonide 40 MG/ML; 5 mL bupivacaine 0.25 % Medications (Left): 40 mg triamcinolone acetonide 40 MG/ML; 5 mL bupivacaine 0.25 % Outcome: tolerated well, no immediate complications  There was excellent flow of contrast producing a partial arthrogram of the hip. The patient did have relief of symptoms during the anesthetic phase of the injection. Procedure, treatment alternatives, risks and benefits explained, specific risks discussed. Consent was given by the patient. Immediately prior to procedure a time out was called to verify the correct patient, procedure, equipment, support staff and site/side marked as required. Patient was prepped and draped in the usual sterile fashion.          Clinical History: No specialty comments available.   He reports that he quit smoking about 61 years ago. His smoking use included cigarettes. He has a 0.13 pack-year smoking history. He has never used smokeless tobacco. No results for input(s): "HGBA1C", "LABURIC" in the last 8760 hours.  Objective:   VS:  HT:    WT:   BMI:     BP:   HR: bpm  TEMP: ( )  RESP:  Physical Exam  Ortho Exam  Imaging: No results found.  Past Medical/Family/Surgical/Social History: Medications & Allergies reviewed per EMR, new medications updated. 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   Bilateral carpal tunnel syndrome 06/10/2017   Essential hypertension 02/25/2016   Hyperlipidemia 02/25/2016   Shortness of breath 02/25/2016   Appendicitis 10/17/2013   Past Medical History:  Diagnosis Date   Arthritis    Asthma    Bradycardia 10/14/2021   Bronchitis    CAD in native artery 04/18/2021   Carpal tunnel syndrome, bilateral    Chronic diastolic heart failure (HCC) 03/07/2020   Coronary artery disease    50-70% LAD by 2001 cath   Essential hypertension 02/25/2016   GERD (gastroesophageal reflux disease)    Hypercholesteremia    Hyperlipidemia 02/25/2016   Hypertension    Obesity (BMI 30-39.9) 02/25/2016   OSA (obstructive sleep apnea) 04/18/2021   Pre-diabetes    Shortness of breath 02/25/2016   Sleep apnea    does not use CPAP   Stroke [facility]) ~2010   mini stroke    Stroke [facility]) 04/18/2021   Family History  Problem Relation Age of Onset   Heart disease Father    Stroke Father  Cancer Brother        colon   Other Son        Paraplegic from motorcycle accident   HIV/AIDS Daughter 25   Past Surgical History:  Procedure Laterality Date   CARDIAC CATHETERIZATION  2001   CARPAL TUNNEL RELEASE Right 07/06/2018   Procedure: RIGHT CARPAL TUNNEL RELEASE, EXTENDED EXCISION;  Surgeon: Cindee Salt, MD;  Location: [facility];  Service: Orthopedics;  Laterality: Right;   CARPAL TUNNEL RELEASE Left 04/12/2019   Procedure: CARPAL TUNNEL RELEASE;  Surgeon: Cindee Salt, MD;  Location: [facility];  Service: Orthopedics;  Laterality: Left;   COLONOSCOPY WITH PROPOFOL N/A 05/21/2015   Procedure: COLONOSCOPY WITH PROPOFOL;  Surgeon: [name], MD;  Location: [facility];  Service: Endoscopy;  Laterality: N/A;   EYE SURGERY     bilateral cataracts   FRACTURE SURGERY  1966   compound arm   HERNIA REPAIR     LAPAROSCOPIC APPENDECTOMY N/A 10/18/2013   Procedure: APPENDECTOMY LAPAROSCOPIC;  Surgeon: [name], MD;  Location: [facility];  Service: General;  Laterality: N/A;   SUBMANDIBULAR GLAND EXCISION Left 09/02/2017    Left submandibular gland and associated soft tissue and lymph nodes   SUBMANDIBULAR GLAND EXCISION Left 09/02/2017   Procedure: EXCISION LEFT SUBMANDIBULAR GLAND;  Surgeon: Serena Colonel, MD;  Location[facility];  Service: ENT;  Laterality: Left;   TESTICLE REMOVAL  1965   Social History   Occupational History   Occupation: retired  Tobacco Use   Smoking status: Former    Packs/day: 0.25    Years: 0.50    Total pack years: 0.13    Types: Cigarettes    Quit date: 10/27/1960    Years since quitting: 61.7   Smokeless tobacco: Never  Vaping Use   Vaping Use: Never used  Substance and Sexual Activity   Alcohol use: No   Drug use: No   Sexual activity: Not on file

## 2022-07-01 NOTE — Progress Notes (Unsigned)
Pt state both hip pain. Pt state walking makes the pain worse. Pt state he takes over the counter pain meds to help ease his pain.  Numeric Pain Rating Scale and Functional Assessment Average Pain 5   In the last MONTH (on 0-10 scale) has pain interfered with the following?  1. General activity like being  able to carry out your everyday physical activities such as walking, climbing stairs, carrying groceries, or moving a chair?  Rating(8)   +BT, -Dye Allergies.

## 2022-07-02 MED ORDER — TRIAMCINOLONE ACETONIDE 40 MG/ML IJ SUSP
40.0000 mg | INTRAMUSCULAR | Status: AC | PRN
  Administered 2022-07-01: 40 mg via INTRA_ARTICULAR

## 2022-07-02 MED ORDER — BUPIVACAINE HCL 0.25 % IJ SOLN
5.0000 mL | INTRAMUSCULAR | Status: AC | PRN
  Administered 2022-07-01: 5 mL via INTRA_ARTICULAR

## 2022-08-04 DIAGNOSIS — N138 Other obstructive and reflux uropathy: Secondary | ICD-10-CM | POA: Diagnosis not present

## 2022-08-04 DIAGNOSIS — N401 Enlarged prostate with lower urinary tract symptoms: Secondary | ICD-10-CM | POA: Diagnosis not present

## 2022-08-06 ENCOUNTER — Telehealth (HOSPITAL_BASED_OUTPATIENT_CLINIC_OR_DEPARTMENT_OTHER): Payer: Self-pay

## 2022-08-06 ENCOUNTER — Ambulatory Visit (HOSPITAL_BASED_OUTPATIENT_CLINIC_OR_DEPARTMENT_OTHER): Payer: Medicare PPO | Admitting: Cardiovascular Disease

## 2022-08-06 ENCOUNTER — Encounter (HOSPITAL_BASED_OUTPATIENT_CLINIC_OR_DEPARTMENT_OTHER): Payer: Self-pay | Admitting: Cardiovascular Disease

## 2022-08-06 VITALS — BP 127/65 | Ht 68.0 in | Wt 235.2 lb

## 2022-08-06 DIAGNOSIS — I10 Essential (primary) hypertension: Secondary | ICD-10-CM | POA: Diagnosis not present

## 2022-08-06 DIAGNOSIS — G4733 Obstructive sleep apnea (adult) (pediatric): Secondary | ICD-10-CM

## 2022-08-06 DIAGNOSIS — I2510 Atherosclerotic heart disease of native coronary artery without angina pectoris: Secondary | ICD-10-CM | POA: Diagnosis not present

## 2022-08-06 DIAGNOSIS — Z0000 Encounter for general adult medical examination without abnormal findings: Secondary | ICD-10-CM

## 2022-08-06 DIAGNOSIS — I5032 Chronic diastolic (congestive) heart failure: Secondary | ICD-10-CM

## 2022-08-06 MED ORDER — EMPAGLIFLOZIN 10 MG PO TABS: 10.0000 mg | ORAL_TABLET | Freq: Every day | ORAL | 3 refills | Status: DC

## 2022-08-06 MED ORDER — ENTRESTO 97-103 MG PO TABS: 1.0000 | ORAL_TABLET | Freq: Two times a day (BID) | ORAL | 3 refills | Status: DC

## 2022-08-06 MED ORDER — HYDROCHLOROTHIAZIDE 25 MG PO TABS: 25.0000 mg | ORAL_TABLET | Freq: Every day | ORAL | 3 refills | Status: AC

## 2022-08-06 NOTE — Assessment & Plan Note (Signed)
BP well-controlled on amlodipine, doxazosin, hydralazine and spironolactone.  Starting [facility] as above.  Continue HCTZ.  Encourage to exercise as able.

## 2022-08-06 NOTE — Progress Notes (Signed)
Cardiology Office Note  Date:  02/24/20  ID:  Jeff Watkins, DOB [birth year], MRN [id number]  PCP:  Leeroy Cha, MD  Cardiologist:   Skeet Latch, MD   No chief complaint on file.  History of Present Illness: Jeff Watkins is a 86 y.o. male with moderate CAD, hypertension, hyperlipidemia, OSA, GERD and diabetes who presents for follow up.  Jeff Watkins was first seen on 5/1 with a complaint of dyspnea on exertion.  He was referred for an echo that showed LVEF 55-60% and grade 2 diastolic dysfunction.  He also had an exercise Myoview that was negative for ischemia but did reveal frequent PVCs.  He exercised for 6 minutes on the Bruce protocol (5.4 METS). At that appointment his losartan/HCTZ was increased due to poorly-controlled hypertension. Hydralazine was then increased to 50mg  bid.  Jeff Watkins had PVCs and metoprolol was increased.  Jeff Watkins had a home health visit through his insurance.  He was noted to have some mild edema and his BP was elevated.  He also had carpal tunnel surgery.  He has had carpal tunnel in both arms.  There was concern for cardiac amyloidosis.  He was referred for an ATT amyloid scan that was negative.  Hydralazine was also increased due to poorly controlled hypertension.  Due to his exertional dyspnea his echo was repeated.  His echo 02/2020 revealed LVEF 60 to 65% with grade 2 diastolic dysfunction.     Jeff Watkins BP was averaging 124/62 but labile.  He was switched from metoprolol to carvedilol. He reported shortness of breath and was asked to increase his exercise. His blood pressure was elevated in the office but he was unsure what it was running at home. He was given a blood pressure cuff and was encouraged to check it regularly. He called the office and noted that he was not taking the spironolactone that was prescribed, so this was resumed 10/2020.   He reported persistent shortness of breath and fatigue. He noted that he had previously been unable to  tolerate his CPAP. He noted that he was bradycardic so carvedilol was stopped. He followed up with Laurann Montana, NP 06/2021 and his heart rate remained 53 despite stopping his beta blocker. He wore a 3 day Zio, which didn't show any significant pauses. He did have some atrial runs up to 30 beats, and rare PACs and PVCs. His wife passed away 2 weeks prior to that appointment.  At the last visit he was feeling well. Heart rates were low but he was asymptomatic.   Today, he says things with him have been "fair." He has been experiencing bilateral hip pain and some shortness of breath. He states that his shortness of breath has been occurring for the past year, but he does not think it has been getting worse. He will most often become short of breath when performing simple activities such as walking to and from the mailbox to get his mail. Additionally, he reports that he has no energy lately, and becomes tired very easily. He has been gaining weight recently but cannot understand why. In regards to his diet, he eats a pretty mild breakfast of an english muffin sometimes. He does not usually eat lunch, but if he does, it will often be soup. He states that he feels pretty rested when he wakes up in the morning, despite having to use the bathroom several times during the night. He denies any palpitations, chest pain, or peripheral edema. No lightheadedness, headaches,  syncope, orthopnea, or PND.  Past Medical History:  Diagnosis Date   Arthritis    Asthma    Bradycardia 10/14/2021   Bronchitis    CAD in native artery 04/18/2021   Carpal tunnel syndrome, bilateral    Chronic diastolic heart failure ([place]) 03/07/2020   Coronary artery disease    50-70% LAD by 2001 cath   Essential hypertension 02/25/2016   GERD (gastroesophageal reflux disease)    Hypercholesteremia    Hyperlipidemia 02/25/2016   Hypertension    Obesity (BMI 30-39.9) 02/25/2016   OSA (obstructive sleep apnea) 04/18/2021   Pre-diabetes     Shortness of breath 02/25/2016   Sleep apnea    does not use CPAP   Stroke ([place]) ~2010   mini stroke    Stroke ([place]) 04/18/2021    Past Surgical History:  Procedure Laterality Date   CARDIAC CATHETERIZATION  2001   CARPAL TUNNEL RELEASE Right 07/06/2018   Procedure: RIGHT CARPAL TUNNEL RELEASE, EXTENDED EXCISION;  Surgeon: Daryll Brod, MD;  Location: [facility];  Service: Orthopedics;  Laterality: Right;   CARPAL TUNNEL RELEASE Left 04/12/2019   Procedure: CARPAL TUNNEL RELEASE;  Surgeon: Daryll Brod, MD;  Location: [facility];  Service: Orthopedics;  Laterality: Left;   COLONOSCOPY WITH PROPOFOL N/A 05/21/2015   Procedure: COLONOSCOPY WITH PROPOFOL;  Surgeon: Garlan Fair, MD;  Location: [facility];  Service: Endoscopy;  Laterality: N/A;   EYE SURGERY     bilateral cataracts   FRACTURE SURGERY  1966   compound arm   HERNIA REPAIR     LAPAROSCOPIC APPENDECTOMY N/A 10/18/2013   Procedure: APPENDECTOMY LAPAROSCOPIC;  Surgeon: Gwenyth Ober, MD;  Location: [facility];  Service: General;  Laterality: N/A;   SUBMANDIBULAR GLAND EXCISION Left 09/02/2017    Left submandibular gland and associated soft tissue and lymph nodes   SUBMANDIBULAR GLAND EXCISION Left 09/02/2017   Procedure: EXCISION LEFT SUBMANDIBULAR GLAND;  Surgeon: Izora Gala, MD;  Location: [facility];  Service: ENT;  Laterality: Left;   TESTICLE REMOVAL  1965    Current Outpatient Medications  Medication Sig Dispense Refill   albuterol (PROVENTIL HFA;VENTOLIN HFA) 108 (90 BASE) MCG/ACT inhaler Inhale 1-2 puffs into the lungs every 6 (six) hours as needed for wheezing or shortness of breath. Reported on 05/08/2016     amLODipine (NORVASC) 10 MG tablet Take 10 mg by mouth at bedtime.      atorvastatin (LIPITOR) 40 MG tablet Take 40 mg by mouth at bedtime.      clopidogrel (PLAVIX) 75 MG tablet Take 75 mg by mouth daily with breakfast.     COVID-19 mRNA Vac-TriS, Pfizer, (PFIZER-BIONT COVID-19 VAC-TRIS)  SUSP injection Inject into the muscle. 0.3 mL 0   doxazosin (CARDURA) 8 MG tablet TAKE 1 TABLET BY MOUTH DAILY 90 tablet 3   empagliflozin (JARDIANCE) 10 MG TABS tablet Take 1 tablet (10 mg total) by mouth daily before breakfast. 90 tablet 3   ferrous sulfate 325 (65 FE) MG tablet Take 325 mg by mouth daily with breakfast.     finasteride (PROSCAR) 5 MG tablet Take 5 mg by mouth daily.     Fish Oil-Cholecalciferol (FISH OIL + D3 PO) Take 1 capsule by mouth every morning.      furosemide (LASIX) 20 MG tablet TAKE ONE (1) TABLET BY MOUTH EVERY DAY 90 tablet 3   hydrALAZINE (APRESOLINE) 100 MG tablet TAKE ONE (1) TABLET BY MOUTH TWO (2) TIMES DAILY 180 tablet 1   hydrochlorothiazide (HYDRODIURIL)  25 MG tablet Take 1 tablet (25 mg total) by mouth daily with breakfast. 90 tablet 3   loratadine (CLARITIN) 10 MG tablet Take 10 mg by mouth every morning.      Multiple Vitamin (MULTI VITAMIN MENS PO) Take 1 tablet by mouth every evening.      Polyethyl Glycol-Propyl Glycol (SYSTANE OP) Place 1 drop into both eyes at bedtime.      sacubitril-valsartan (ENTRESTO) 97-103 MG Take 1 tablet by mouth 2 (two) times daily. 180 tablet 3   spironolactone (ALDACTONE) 25 MG tablet TAKE 1 TABLET BY MOUTH DAILY 90 tablet 1   Testosterone 20.25 MG/1.25GM (1.62%) GEL Place 2 Pump onto the skin every other day.     traMADol (ULTRAM) 50 MG tablet TAKE ONE (1) TABLET BY MOUTH EVERY 6 HOURS AS NEEDED 30 tablet 0   No current facility-administered medications for this visit.    Allergies:   Elemental sulfur and Penicillins    Social History:  The patient  reports that he quit smoking about 61 years ago. His smoking use included cigarettes. He has a 0.13 pack-year smoking history. He has never used smokeless tobacco. He reports that he does not drink alcohol and does not use drugs.   Family History:  The patient's family history includes Cancer in his brother; HIV/AIDS (age of onset: 26) in his daughter; Heart disease in  his father; Other in his son; Stroke in his father.    ROS:   Please see the history of present illness. (+) Shortness of breath (+) Fatigue  (+) Bilateral hip pain All other systems are reviewed and negative.    PHYSICAL EXAM: VS:  BP 127/65 (BP Location: Left Arm, Patient Position: Sitting, Cuff Size: Large)   Ht 5\' 8"  (1.727 m)   Wt 235 lb 3.2 oz (106.7 kg)   BMI 35.76 kg/m  , BMI Body mass index is 35.76 kg/m. GENERAL:  Well appearing HEENT: Pupils equal round and reactive, fundi not visualized, oral mucosa unremarkable NECK:  No jugular venous distention, waveform within normal limits, carotid upstroke brisk and symmetric, no bruits LUNGS:  Clear to auscultation bilaterally HEART:  RRR.  PMI not displaced or sustained,S1 and S2 within normal limits, no S3, no S4, no clicks, no rubs, no murmurs ABD:  Flat, positive bowel sounds normal in frequency in pitch, no bruits, no rebound, no guarding, no midline pulsatile mass, no hepatomegaly, no splenomegaly EXT:  1+ DP/PT bilaterally, trace bilateral LE edema, no cyanosis no clubbing SKIN:  No rashes no nodules NEURO:  Cranial nerves II through XII grossly intact, motor grossly intact throughout PSYCH:  Cognitively intact, oriented to person place and time  EKG: EKG is personally reviewed. 08/06/22: EKG was not ordered. 10/14/2021: EKG was not ordered. 04/18/2021: Sinus bradycardia. Rate 42 bpm. 09/26/2020: EKG was not ordered. 02/24/20: Sinus rhythm.  Rate 66 bpm.  06/24/18: Sinus bradycardia.  Rate 59 bpm.   02/25/16: sinus rhythm. Rate 63 bpm. Nonspecific T wave flattening.  Other studies reviewed:  Monitor 07/2021: 7 Day Zio Monitor   Quality: Fair.  Baseline artifact. Predominant rhythm: sinus bradycardia Average heart rate: 51 bpm Max heart rate: 81 bpm Min heart rate: 36 bpm Pauses >2.5 seconds: none   Several atrial runs up to 30 beats Rare PACs Rare PVCs  Echo 03/15/2020: 1. Left ventricular ejection fraction,  by estimation, is 60 to 65%. The  left ventricle has normal function. The left ventricle has no regional  wall motion abnormalities. Left ventricular diastolic  parameters are  consistent with Grade II diastolic  dysfunction (pseudonormalization).   2. Right ventricular systolic function is normal. The right ventricular  size is normal. There is mildly elevated pulmonary artery systolic  pressure.   3. Left atrial size was mildly dilated.   4. The mitral valve is normal in structure. No evidence of mitral valve  regurgitation. No evidence of mitral stenosis.   5. The aortic valve is tricuspid. Aortic valve regurgitation is mild. No  aortic stenosis is present.   6. Aortic dilatation noted. There is mild dilatation of the ascending  aorta measuring 42 mm.   7. The inferior vena cava is normal in size with greater than 50%  respiratory variability, suggesting right atrial pressure of 3 mmHg.  Myocardial Amyloid Imaging Planar and Spect 03/14/2020: The study is normal.   The study is normal. The study is not suggestive of TTR amyloidosis (visual score of 0 or H/CL ratio<1).  Renal Artery Duplex 03/14/2020: Summary:  Renal:     Right: Normal size right kidney. Normal right Resisitive Index.         Normal cortical thickness of right kidney. No evidence of         right renal artery stenosis. RRV flow present.  Left:  Normal size of left kidney. Abnormal left Resisitve Index.         Normal cortical thickness of the left kidney. No evidence of         left renal artery stenosis. LRV flow present. Cyst(s) noted.         measuring 3.8 cm 3.8 cm.  Mesenteric:  Normal Celiac artery and Superior Mesenteric artery findings.   Echo 03/19/16: Study Conclusions   - Left ventricle: The cavity size was normal. There was   mild-moderate concentric hypertrophy. Systolic function was   normal. The estimated ejection fraction was in the range of 55%   to 60%. Wall motion was normal; there were no  regional wall   motion abnormalities. Features are consistent with a pseudonormal   left ventricular filling pattern, with concomitant abnormal   relaxation and increased filling pressure (grade 2 diastolic   dysfunction). Doppler parameters are consistent with   indeterminate ventricular filling pressure. - Aortic valve: Transvalvular velocity was within the normal range.   There was no stenosis. There was no regurgitation. Valve area   (VTI): 3.1 cm^2. Valve area (Vmax): 3.11 cm^2. Valve area   (Vmean): 3.04 cm^2. - Mitral valve: Transvalvular velocity was within the normal range.   There was no evidence for stenosis. There was no regurgitation. - Left atrium: The appendage was moderately dilated. - Right ventricle: The cavity size was mildly dilated. Wall   thickness was normal. Systolic function was normal. - Tricuspid valve: There was mild regurgitation. - Pulmonary arteries: Systolic pressure was within the normal   range. PA peak pressure: 35 mm Hg (S). - Inferior vena cava: The vessel was normal in size. The   respirophasic diameter changes were in the normal range (= 50%),   consistent with normal central venous pressure.  Exercise Myoview 03/19/16:  Nuclear stress EF: 59%. No wall motion abnormality The left ventricular ejection fraction is normal (55-65%). There was no ST segment deviation noted during stress. There were frequent PVCs during exercise, rare couplet This is a low risk perfusion study, no ischemia identified. Question if frequent PVCs are contributing to dyspnea on exertion. Fair exercise effort of 6 minutes and 13 seconds.  Recent Labs: 04/11/2022: BNP 328.1;  BUN 23; Creatinine, Ser 1.22; Hemoglobin 10.6; Platelets 191; Potassium 4.8; Sodium 135; TSH 2.560   02/16/2018: Total cholesterol 135, triglycerides 185, HDL 42, LDL 55 Potassium 4.3, creatinine 1.03 ALT 9.0  Lipid Panel    Component Value Date/Time   CHOL  10/17/2007 0540    118        ATP III  CLASSIFICATION:  <200     mg/dL   Desirable  200-239  mg/dL   Borderline High  >=240    mg/dL   High   TRIG 101 10/17/2007 0540   HDL 28 (L) 10/17/2007 0540   CHOLHDL 4.2 10/17/2007 0540   VLDL 20 10/17/2007 0540   LDLCALC  10/17/2007 0540    70        Total Cholesterol/HDL:CHD Risk Coronary Heart Disease Risk Table                     Men   Women  1/2 Average Risk   3.4   3.3      Wt Readings from Last 3 Encounters:  08/06/22 235 lb 3.2 oz (106.7 kg)  04/11/22 232 lb 3.2 oz (105.3 kg)  10/14/21 230 lb 4.8 oz (104.5 kg)      ASSESSMENT AND PLAN:  Chronic diastolic heart failure (HCC) He appears euvolemic but is gaining weight and has exertional dyspnea.  Will optimize GDMT.  Stop valsartan/HCTZ and start Entresto 97/103mg  bid and HCTZ 25mg  daily.  Start Jardiance 10mg  daily.  BNP has been elevated in the past.  Will get BMP and BNP in one week.  F/u in one month to assess for symptomatic improvement.  Essential hypertension BP well-controlled on amlodipine, doxazosin, hydralazine and spironolactone.  Starting [facility] as above.  Continue HCTZ.  Encourage to exercise as able.  OSA (obstructive sleep apnea) Unable to tolerate CPAP.  This may be contributing to his fatigue and dyspnea.  Hyperlipidemia Continue atorvastatin.  CAD in native artery Moderate disease on remote cath.  If dyspnea doesn't improve with HF management, will need to consider repeat ischemia evaluation.    Current medicines are reviewed at length with the patient today.  The patient does not have concerns regarding medicines.  The following changes have been made: Increase hydralazine.  Labs/ tests ordered today include:   Orders Placed This Encounter  Procedures   Basic metabolic panel    Disposition:  FU with APP in 1 month. FU with Jonnell Hentges C. Oval Linsey, MD, [facility] in 6 months.   I,Breanna Adamick,acting as a scribe for Skeet Latch, MD.,have documented all relevant documentation on the  behalf of Skeet Latch, MD,as directed by  Skeet Latch, MD while in the presence of Skeet Latch, MD.   I, Craig Oval Linsey, MD have reviewed all documentation for this visit.  The documentation of the exam, diagnosis, procedures, and orders on 08/06/2022 are all accurate and complete.   Signed, Yizel Canby C. Oval Linsey, MD, [facility]  08/06/2022 10:47 AM    [place]

## 2022-08-06 NOTE — Assessment & Plan Note (Signed)
Continue atorvastatin

## 2022-08-06 NOTE — Patient Instructions (Signed)
Medication Instructions:  Your physician has recommended you make the following change in your medication:   STOP: VALSARTAN- HYDROCHLOROTHIAZIDE  START: Entresto 97-103 twice daily   START: Hydrochlorothiazide 25mg  daily in the morning   START: Jardiance 10mg  daily in the morning    *If you need a refill on your cardiac medications before your next appointment, please call your pharmacy*   Lab Work: Please return for Lab work in one week for BMP. You may come to the...   [facility] (3rd floor) [address], [place], [place] [address]  Open: 8am-Noon and 1pm-4:30pm  Please ring the doorbell on the small table when you exit the elevator and the Lab Tech will come get you  [facility] [address] [place], [place], [place] [address] Open: 8am-1pm, then 2pm-4:30pm   [place]- Please see attached locations sheet stapled to your lab work with address and hours.   If you have labs (blood work) drawn today and your tests are completely normal, you will receive your results only by: [place] (if you have MyChart) OR A paper copy in the mail If you have any lab test that is abnormal or we need to change your treatment, we will call you to review the results.  Follow-Up: At [facility], you and your health needs are our priority.  As part of our continuing mission to provide you with exceptional heart care, we have created designated Provider Care Teams.  These Care Teams include your primary Cardiologist (physician) and Advanced Practice Providers (APPs -  Physician Assistants and Nurse Practitioners) who all work together to provide you with the care you need, when you need it.  We recommend signing up for the patient portal called "MyChart".  Sign up information is provided on this After Visit Summary.  MyChart is used to connect with patients for Virtual Visits (Telemedicine).  Patients are able to view lab/test  results, encounter notes, upcoming appointments, etc.  Non-urgent messages can be sent to your provider as well.   To learn more about what you can do with MyChart, go to [email or web address].    Your next appointment:   Follow up in 1 month with Laurann Montana, NP     &   Follow up in 6 months with Dr. Oval Linsey

## 2022-08-06 NOTE — Assessment & Plan Note (Signed)
Unable to tolerate CPAP.  This may be contributing to his fatigue and dyspnea.

## 2022-08-06 NOTE — Telephone Encounter (Signed)
Per cover my meds Entresto prior auth not required.    Per cover my meds Jardiance Prior auth not required

## 2022-08-06 NOTE — Assessment & Plan Note (Signed)
Moderate disease on remote cath.  If dyspnea doesn't improve with HF management, will need to consider repeat ischemia evaluation.

## 2022-08-06 NOTE — Assessment & Plan Note (Signed)
He appears euvolemic but is gaining weight and has exertional dyspnea.  Will optimize GDMT.  Stop valsartan/HCTZ and start Entresto 97/103mg  bid and HCTZ 25mg  daily.  Start Jardiance 10mg  daily.  BNP has been elevated in the past.  Will get BMP and BNP in one week.  F/u in one month to assess for symptomatic improvement.

## 2022-08-15 DIAGNOSIS — I10 Essential (primary) hypertension: Secondary | ICD-10-CM | POA: Diagnosis not present

## 2022-08-15 DIAGNOSIS — Z0000 Encounter for general adult medical examination without abnormal findings: Secondary | ICD-10-CM | POA: Diagnosis not present

## 2022-08-16 LAB — BASIC METABOLIC PANEL
BUN/Creatinine Ratio: 20 (ref 10–24)
BUN: 28 mg/dL — ABNORMAL HIGH (ref 8–27)
CO2: 22 mmol/L (ref 20–29)
Calcium: 9.2 mg/dL (ref 8.6–10.2)
Chloride: 96 mmol/L (ref 96–106)
Creatinine, Ser: 1.38 mg/dL — ABNORMAL HIGH (ref 0.76–1.27)
Glucose: 176 mg/dL — ABNORMAL HIGH (ref 70–99)
Potassium: 5.4 mmol/L — ABNORMAL HIGH (ref 3.5–5.2)
Sodium: 133 mmol/L — ABNORMAL LOW (ref 134–144)
eGFR: 50 mL/min/{1.73_m2} — ABNORMAL LOW (ref >59)

## 2022-08-19 ENCOUNTER — Telehealth (HOSPITAL_BASED_OUTPATIENT_CLINIC_OR_DEPARTMENT_OTHER): Payer: Self-pay | Admitting: *Deleted

## 2022-08-19 DIAGNOSIS — E875 Hyperkalemia: Secondary | ICD-10-CM

## 2022-08-19 DIAGNOSIS — I10 Essential (primary) hypertension: Secondary | ICD-10-CM

## 2022-08-19 DIAGNOSIS — Z5181 Encounter for therapeutic drug level monitoring: Secondary | ICD-10-CM

## 2022-08-19 NOTE — Telephone Encounter (Signed)
Advised patient of lab results and medication changes  Confirmed not taking Valsartan HCT

## 2022-08-19 NOTE — Telephone Encounter (Signed)
-----   Message from Loel Dubonnet, NP sent at 08/19/2022  4:41 PM EDT ----- Kidney function slightly decreased from previous. Potassium mildly elevated.  Reduce Spironolactone to half tablet daily. Ensure not taking Valsartan-HCTZ. Ensure staying hydrated (but not drinking more than 64 oz fluid per day)  Recommend repeat BMP Friday or Monday for monitoring.

## 2022-08-20 DIAGNOSIS — K5900 Constipation, unspecified: Secondary | ICD-10-CM | POA: Diagnosis not present

## 2022-08-20 DIAGNOSIS — I10 Essential (primary) hypertension: Secondary | ICD-10-CM | POA: Diagnosis not present

## 2022-08-20 DIAGNOSIS — E291 Testicular hypofunction: Secondary | ICD-10-CM | POA: Diagnosis not present

## 2022-08-20 DIAGNOSIS — I5032 Chronic diastolic (congestive) heart failure: Secondary | ICD-10-CM | POA: Diagnosis not present

## 2022-08-20 DIAGNOSIS — I2510 Atherosclerotic heart disease of native coronary artery without angina pectoris: Secondary | ICD-10-CM | POA: Diagnosis not present

## 2022-08-25 DIAGNOSIS — I10 Essential (primary) hypertension: Secondary | ICD-10-CM | POA: Diagnosis not present

## 2022-08-25 DIAGNOSIS — E875 Hyperkalemia: Secondary | ICD-10-CM | POA: Diagnosis not present

## 2022-08-25 DIAGNOSIS — Z5181 Encounter for therapeutic drug level monitoring: Secondary | ICD-10-CM | POA: Diagnosis not present

## 2022-08-26 ENCOUNTER — Telehealth (HOSPITAL_BASED_OUTPATIENT_CLINIC_OR_DEPARTMENT_OTHER): Payer: Self-pay

## 2022-08-26 LAB — BASIC METABOLIC PANEL
BUN/Creatinine Ratio: 17 (ref 10–24)
BUN: 21 mg/dL (ref 8–27)
CO2: 20 mmol/L (ref 20–29)
Calcium: 8.8 mg/dL (ref 8.6–10.2)
Chloride: 97 mmol/L (ref 96–106)
Creatinine, Ser: 1.21 mg/dL (ref 0.76–1.27)
Glucose: 156 mg/dL — ABNORMAL HIGH (ref 70–99)
Potassium: 4.9 mmol/L (ref 3.5–5.2)
Sodium: 133 mmol/L — ABNORMAL LOW (ref 134–144)
eGFR: 59 mL/min/{1.73_m2} — ABNORMAL LOW (ref >59)

## 2022-08-26 NOTE — Telephone Encounter (Addendum)
Results called to patient who verbalizes understanding!    ----- Message from Loel Dubonnet, NP sent at 08/26/2022  8:06 AM EDT ----- Normal kidney function and potassium. Stable mild hyponatremia. Potassium has normalized. Good result! Continue current medications.

## 2022-09-04 ENCOUNTER — Telehealth: Payer: Self-pay | Admitting: Physical Medicine and Rehabilitation

## 2022-09-04 NOTE — Telephone Encounter (Signed)
Pt called requesting a call back for an bilateral hip injection. Please call pt at [phone].

## 2022-09-04 NOTE — Telephone Encounter (Signed)
Patient is having same kind of pain he was before. His last injection was 07/01/22. He would like to know if he can get another injection. Please advise.

## 2022-09-04 NOTE — Telephone Encounter (Signed)
IC patient to schedule. He needed sooner appt so decided to have scheduled with Dr Shon Baton.

## 2022-09-08 ENCOUNTER — Ambulatory Visit (HOSPITAL_BASED_OUTPATIENT_CLINIC_OR_DEPARTMENT_OTHER): Payer: Medicare PPO | Admitting: Family

## 2022-09-08 ENCOUNTER — Encounter (HOSPITAL_BASED_OUTPATIENT_CLINIC_OR_DEPARTMENT_OTHER): Payer: Self-pay | Admitting: Family

## 2022-09-08 VITALS — BP 125/66 | HR 56 | Ht 68.0 in | Wt 234.0 lb

## 2022-09-08 DIAGNOSIS — I5032 Chronic diastolic (congestive) heart failure: Secondary | ICD-10-CM | POA: Diagnosis not present

## 2022-09-08 DIAGNOSIS — Z8673 Personal history of transient ischemic attack (TIA), and cerebral infarction without residual deficits: Secondary | ICD-10-CM | POA: Diagnosis not present

## 2022-09-08 DIAGNOSIS — I10 Essential (primary) hypertension: Secondary | ICD-10-CM

## 2022-09-08 DIAGNOSIS — G4733 Obstructive sleep apnea (adult) (pediatric): Secondary | ICD-10-CM

## 2022-09-08 DIAGNOSIS — I25118 Atherosclerotic heart disease of native coronary artery with other forms of angina pectoris: Secondary | ICD-10-CM

## 2022-09-08 DIAGNOSIS — E782 Mixed hyperlipidemia: Secondary | ICD-10-CM

## 2022-09-08 DIAGNOSIS — R001 Bradycardia, unspecified: Secondary | ICD-10-CM | POA: Diagnosis not present

## 2022-09-08 NOTE — Patient Instructions (Addendum)
Medication Instructions:  Continue your current medications.   Be sure to take Jardiance and Furosemide (Lasix) in the morning to prevent going to the bathroom at night.   *If you need a refill on your cardiac medications before your next appointment, please call your pharmacy*   Lab Work/Testing/Procedures: None ordered today.   Follow-Up: At [facility], you and your health needs are our priority.  As part of our continuing mission to provide you with exceptional heart care, we have created designated Provider Care Teams.  These Care Teams include your primary Cardiologist (physician) and Advanced Practice Providers (APPs -  Physician Assistants and Nurse Practitioners) who all work together to provide you with the care you need, when you need it.  We recommend signing up for the patient portal called "MyChart".  Sign up information is provided on this After Visit Summary.  MyChart is used to connect with patients for Virtual Visits (Telemedicine).  Patients are able to view lab/test results, encounter notes, upcoming appointments, etc.  Non-urgent messages can be sent to your provider as well.   To learn more about what you can do with MyChart, go to [email or web address].    Your next appointment:   3 month(s)  The format for your next appointment:   In Person  Provider:   [name], MD or Gillian Shields, NP    Other Instructions  Heart Healthy Diet Recommendations: A low-salt diet is recommended. Meats should be grilled, baked, or boiled. Avoid fried foods. Focus on lean protein sources like fish or chicken with vegetables and fruits. The American Heart Association is a Chief Technology Officer!  American Heart Association Diet and Lifeystyle Recommendations   Exercise recommendations: The American Heart Association recommends 150 minutes of moderate intensity exercise weekly. Try 30 minutes of moderate intensity exercise 4-5 times per week. This could include walking,  jogging, or swimming.  Important Information About Sugar

## 2022-09-08 NOTE — Progress Notes (Signed)
Office Visit    Patient Name: Jeff Watkins Date of Encounter: 09/08/2022  PCP:  Lorenda Ishihara, MD   [facility]  Cardiologist:  [name], MD  Advanced Practice Provider:  No care team member to display Electrophysiologist:  None    Chief Complaint    Jeff Watkins is a 86 y.o. male presents today for follow up after medication changes.   Past Medical History    Past Medical History:  Diagnosis Date   Arthritis    Asthma    Bradycardia 10/14/2021   Bronchitis    CAD in native artery 04/18/2021   Carpal tunnel syndrome, bilateral    Chronic diastolic heart failure (HCC) 03/07/2020   Coronary artery disease    50-70% LAD by 2001 cath   Essential hypertension 02/25/2016   GERD (gastroesophageal reflux disease)    Hypercholesteremia    Hyperlipidemia 02/25/2016   Hypertension    Obesity (BMI 30-39.9) 02/25/2016   OSA (obstructive sleep apnea) 04/18/2021   Pre-diabetes    Shortness of breath 02/25/2016   Sleep apnea    does not use CPAP   Stroke (HCC) ~2010   mini stroke    Stroke (HCC) 04/18/2021   Past Surgical History:  Procedure Laterality Date   CARDIAC CATHETERIZATION  2001   CARPAL TUNNEL RELEASE Right 07/06/2018   Procedure: RIGHT CARPAL TUNNEL RELEASE, EXTENDED EXCISION;  Surgeon: Cindee Salt, MD;  Location: [facility];  Service: Orthopedics;  Laterality: Right;   CARPAL TUNNEL RELEASE Left 04/12/2019   Procedure: CARPAL TUNNEL RELEASE;  Surgeon: Cindee Salt, MD;  Location: [facility];  Service: Orthopedics;  Laterality: Left;   COLONOSCOPY WITH PROPOFOL N/A 05/21/2015   Procedure: COLONOSCOPY WITH PROPOFOL;  Surgeon: [name], MD;  Location: [facility];  Service: Endoscopy;  Laterality: N/A;   EYE SURGERY     bilateral cataracts   FRACTURE SURGERY  1966   compound arm   HERNIA REPAIR     LAPAROSCOPIC APPENDECTOMY N/A 10/18/2013   Procedure: APPENDECTOMY LAPAROSCOPIC;  Surgeon: [name], MD;  Location: [facility];  Service: General;  Laterality: N/A;   SUBMANDIBULAR GLAND EXCISION Left 09/02/2017    Left submandibular gland and associated soft tissue and lymph nodes   SUBMANDIBULAR GLAND EXCISION Left 09/02/2017   Procedure: EXCISION LEFT SUBMANDIBULAR GLAND;  Surgeon: Serena Colonel, MD;  Location: [facility];  Service: ENT;  Laterality: Left;   TESTICLE REMOVAL  1965    Allergies  Allergies  Allergen Reactions   Elemental Sulfur Hives    In genital area accompanied by blisters   Penicillins Rash    History of Present Illness    Jeff Watkins is a 86 y.o. male with a hx of moderate CAD, HTN, HLD, OSA, CVA, GERD, DM2  last seen 08/06/2022 by Dr. Duke Salvia  Seen 04/18/21 with shortness of breath and easy fatigue. Previously intolerant of CPAP. Carvedilol was discontinued due to bradycardia. At clinic visit 06/2021 noted he had lost his wife unexpectedly. ZIO monitor ordered due to bradycardia with fatigue revealing SB average rate 51 bpm with rare PAc/PVC, no significant pause, and several atrial runs up to 30 beats which were asymptomatic. In clinic 09/2021 with BP reasonably well controlled and asymptomatic in regards to his bradycardia.   In clinic 04/11/2022 due to fatigue and volume overload Lasix increased for 3 days and BMP, BNP, CBC, TSH ordered.  CBC showed anemia and he was referred to gastroenterology.  Last seen 08/06/2022 by Dr. Duke Salvia.  For optimization of GDMT for chronic diastolic heart failure valsartan-HCTZ was stopped and he was started on Entresto 97-103 mg twice daily and hydrochlorothiazide 25mg  QD as well as Jardiance 10mg  daily. Spironolactone later reduced due to hyperkalemia.   Presents today for follow up independently. Continues to enjoy spending time with his son who lives locally and is a paraplegic though active and able to drive handicapped . Tells me one of his hips is bothering him and has an injection tomorrow. Also notes his eyes have been  watering a lot and sees ophthalmology. Notes energy level is about the same. Breathing might be a little bit better with exertion. He attributes his lack of energy to testosterone level and is on supplements by primary care provider.   EKGs/Labs/Other Studies Reviewed:   The following studies were reviewed today:  ZIO 06/2021 7 Day Zio Monitor   Quality: Fair.  Baseline artifact. Predominant rhythm: sinus bradycardia Average heart rate: 51 bpm Max heart rate: 81 bpm Min heart rate: 36 bpm Pauses >2.5 seconds: none   Several atrial runs up to 30 beats Rare PACs Rare PVCs    Echo 03/15/2020: 1. Left ventricular ejection fraction, by estimation, is 60 to 65%. The  left ventricle has normal function. The left ventricle has no regional  wall motion abnormalities. Left ventricular diastolic parameters are  consistent with Grade II diastolic  dysfunction (pseudonormalization).   2. Right ventricular systolic function is normal. The right ventricular  size is normal. There is mildly elevated pulmonary artery systolic  pressure.   3. Left atrial size was mildly dilated.   4. The mitral valve is normal in structure. No evidence of mitral valve  regurgitation. No evidence of mitral stenosis.   5. The aortic valve is tricuspid. Aortic valve regurgitation is mild. No  aortic stenosis is present.   6. Aortic dilatation noted. There is mild dilatation of the ascending  aorta measuring 42 mm.   7. The inferior vena cava is normal in size with greater than 50%  respiratory variability, suggesting right atrial pressure of 3 mmHg.   Myocardial Amyloid Imaging Planar and Spect 03/14/2020: The study is normal.   The study is normal. The study is not suggestive of TTR amyloidosis (visual score of 0 or H/CL ratio<1).   Renal Artery Duplex 03/14/2020: Summary:  Renal:     Right: Normal size right kidney. Normal right Resisitive Index.         Normal cortical thickness of right kidney. No  evidence of         right renal artery stenosis. RRV flow present.  Left:  Normal size of left kidney. Abnormal left Resisitve Index.         Normal cortical thickness of the left kidney. No evidence of         left renal artery stenosis. LRV flow present. Cyst(s) noted.         measuring 3.8 cm 3.8 cm.  Mesenteric:  Normal Celiac artery and Superior Mesenteric artery findings.    Echo 03/19/16: Study Conclusions   - Left ventricle: The cavity size was normal. There was   mild-moderate concentric hypertrophy. Systolic function was   normal. The estimated ejection fraction was in the range of 55%   to 60%. Wall motion was normal; there were no regional wall   motion abnormalities. Features are consistent with a pseudonormal   left ventricular filling pattern, with concomitant abnormal   relaxation and  increased filling pressure (grade 2 diastolic   dysfunction). Doppler parameters are consistent with   indeterminate ventricular filling pressure. - Aortic valve: Transvalvular velocity was within the normal range.   There was no stenosis. There was no regurgitation. Valve area   (VTI): 3.1 cm^2. Valve area (Vmax): 3.11 cm^2. Valve area   (Vmean): 3.04 cm^2. - Mitral valve: Transvalvular velocity was within the normal range.   There was no evidence for stenosis. There was no regurgitation. - Left atrium: The appendage was moderately dilated. - Right ventricle: The cavity size was mildly dilated. Wall   thickness was normal. Systolic function was normal. - Tricuspid valve: There was mild regurgitation. - Pulmonary arteries: Systolic pressure was within the normal   range. PA peak pressure: 35 mm Hg (S). - Inferior vena cava: The vessel was normal in size. The   respirophasic diameter changes were in the normal range (= 50%),   consistent with normal central venous pressure.   Exercise Myoview 03/19/16:   Nuclear stress EF: 59%. No wall motion abnormality The left ventricular ejection  fraction is normal (55-65%). There was no ST segment deviation noted during stress. There were frequent PVCs during exercise, rare couplet This is a low risk perfusion study, no ischemia identified. Question if frequent PVCs are contributing to dyspnea on exertion. Fair exercise effort of 6 minutes and 13 seconds.   EKG:  EKG is not ordered today.   Recent Labs: 04/11/2022: BNP 328.1; Hemoglobin 10.6; Platelets 191; TSH 2.560 08/25/2022: BUN 21; Creatinine, Ser 1.21; Potassium 4.9; Sodium 133  Recent Lipid Panel    Component Value Date/Time   CHOL  10/17/2007 0540    118        ATP III CLASSIFICATION:  <200     mg/dL   Desirable  [id number]  mg/dL   Borderline High  >=562>=240    mg/dL   High   TRIG 130101 86/57/846912/21/2008 0540   HDL 28 (L) 10/17/2007 0540   CHOLHDL 4.2 10/17/2007 0540   VLDL 20 10/17/2007 0540   LDLCALC  10/17/2007 0540    70        Total Cholesterol/HDL:CHD Risk Coronary Heart Disease Risk Table                     Men   Women  1/2 Average Risk   3.4   3.3    Home Medications   Current Meds  Medication Sig   albuterol (PROVENTIL HFA;VENTOLIN HFA) 108 (90 BASE) MCG/ACT inhaler Inhale 1-2 puffs into the lungs every 6 (six) hours as needed for wheezing or shortness of breath. Reported on 05/08/2016   amLODipine (NORVASC) 10 MG tablet Take 10 mg by mouth at bedtime.    atorvastatin (LIPITOR) 40 MG tablet Take 40 mg by mouth at bedtime.    clopidogrel (PLAVIX) 75 MG tablet Take 75 mg by mouth daily with breakfast.   COVID-19 mRNA Vac-TriS, Pfizer, (PFIZER-BIONT COVID-19 VAC-TRIS) SUSP injection Inject into the muscle.   doxazosin (CARDURA) 8 MG tablet TAKE 1 TABLET BY MOUTH DAILY   empagliflozin (JARDIANCE) 10 MG TABS tablet Take 1 tablet (10 mg total) by mouth daily before breakfast.   ferrous sulfate 325 (65 FE) MG tablet Take 325 mg by mouth daily with breakfast.   finasteride (PROSCAR) 5 MG tablet Take 5 mg by mouth daily.   Fish Oil-Cholecalciferol (FISH OIL + D3 PO) Take 1  capsule by mouth every morning.    furosemide (LASIX) 20 MG tablet TAKE  ONE (1) TABLET BY MOUTH EVERY DAY   hydrALAZINE (APRESOLINE) 100 MG tablet TAKE ONE (1) TABLET BY MOUTH TWO (2) TIMES DAILY   hydrochlorothiazide (HYDRODIURIL) 25 MG tablet Take 1 tablet (25 mg total) by mouth daily with breakfast.   loratadine (CLARITIN) 10 MG tablet Take 10 mg by mouth every morning.    Multiple Vitamin (MULTI VITAMIN MENS PO) Take 1 tablet by mouth every evening.    Polyethyl Glycol-Propyl Glycol (SYSTANE OP) Place 1 drop into both eyes at bedtime.    sacubitril-valsartan (ENTRESTO) 97-103 MG Take 1 tablet by mouth 2 (two) times daily.   spironolactone (ALDACTONE) 25 MG tablet Take 12.5 mg by mouth daily.   Testosterone 20.25 MG/1.25GM (1.62%) GEL Place 2 Pump onto the skin every other day.   traMADol (ULTRAM) 50 MG tablet TAKE ONE (1) TABLET BY MOUTH EVERY 6 HOURS AS NEEDED     Review of Systems      All other systems reviewed and are otherwise negative except as noted above.  Physical Exam    VS:  BP 125/66   Pulse (!) 56   Ht 5\' 8"  (1.727 m)   Wt 234 lb (106.1 kg)   BMI 35.58 kg/m  , BMI Body mass index is 35.58 kg/m.  Wt Readings from Last 3 Encounters:  09/08/22 234 lb (106.1 kg)  08/06/22 235 lb 3.2 oz (106.7 kg)  04/11/22 232 lb 3.2 oz (105.3 kg)    GEN: Well nourished, well developed, in no acute distress. HEENT: normal. Neck: Supple, no JVD, carotid bruits, or masses. Cardiac: Bradycardic, RRR, no murmurs, rubs, or gallops. No clubbing, cyanosis, edema.  Radials/PT 2+ and equal bilaterally.  Respiratory:  Respirations regular and unlabored, clear to auscultation bilaterally. GI: Soft, nontender, nondistended. MS: No deformity or atrophy. Skin: Warm and dry, no rash. Neuro:  Strength and sensation are intact. Psych: Normal affect.  Assessment & Plan    Bradycardia -persistent bradycardia today. ZIO 06/2021 with no significant pause nor heart block. Avoid AV nodal blocking.    Chronic diastolic heart failure - Grossly euvolemic on exam. Continue Entresto 97-103mg  BID, Lasix 20mg  QD, Jardiance 10mg  daily, Spironolactone 12.5mg  daily. Will not escalate dose of Spironolactone due to hyperkalemia. Low sodium diet, fluid restriction <2L, and daily weights encouraged. Educated to contact our office for weight gain of 2 lbs overnight or 5 lbs in one week.  Patient assistance paperwork for 07/2021 provided in clinic today.  OSA - Intolerant of CPAP due to claustrophobia.  Hyperlipidemia - 04/2022 LDL 56. Continue atorvastatin.  HTN - BP well controlled. Continue current antihypertensive regimen.    CAD - Nonobstructive coronary artery disease. Denies chest pain. No indication for ischemic evaluation.  Continue atorvastatin, clopidogrel.  No beta-blocker due to bradycardia. Heart healthy diet and regular cardiovascular exercise encouraged.    History of CVA - Occurred around 2009.  Continue atorvastatin, clopidogrel, blood pressure control.  Disposition: Follow up in 3 month(s)  with Dr. Valla Leaver or APP.  Signed, 05/2022, NP 09/08/2022, 2:53 PM [facility]

## 2022-09-09 ENCOUNTER — Ambulatory Visit: Payer: Self-pay

## 2022-09-09 ENCOUNTER — Ambulatory Visit: Payer: Medicare PPO | Admitting: Sports Medicine

## 2022-09-09 DIAGNOSIS — M25551 Pain in right hip: Secondary | ICD-10-CM | POA: Diagnosis not present

## 2022-09-09 DIAGNOSIS — H04123 Dry eye syndrome of bilateral lacrimal glands: Secondary | ICD-10-CM | POA: Diagnosis not present

## 2022-09-09 DIAGNOSIS — M25552 Pain in left hip: Secondary | ICD-10-CM | POA: Diagnosis not present

## 2022-09-09 DIAGNOSIS — M1611 Unilateral primary osteoarthritis, right hip: Secondary | ICD-10-CM

## 2022-09-09 DIAGNOSIS — M160 Bilateral primary osteoarthritis of hip: Secondary | ICD-10-CM

## 2022-09-09 DIAGNOSIS — M48062 Spinal stenosis, lumbar region with neurogenic claudication: Secondary | ICD-10-CM

## 2022-09-09 DIAGNOSIS — H02831 Dermatochalasis of right upper eyelid: Secondary | ICD-10-CM | POA: Diagnosis not present

## 2022-09-09 DIAGNOSIS — H02834 Dermatochalasis of left upper eyelid: Secondary | ICD-10-CM | POA: Diagnosis not present

## 2022-09-09 DIAGNOSIS — H04413 Chronic dacryocystitis of bilateral lacrimal passages: Secondary | ICD-10-CM | POA: Diagnosis not present

## 2022-09-09 DIAGNOSIS — M1612 Unilateral primary osteoarthritis, left hip: Secondary | ICD-10-CM | POA: Diagnosis not present

## 2022-09-09 DIAGNOSIS — H1045 Other chronic allergic conjunctivitis: Secondary | ICD-10-CM | POA: Diagnosis not present

## 2022-09-09 MED ORDER — METHYLPREDNISOLONE ACETATE 40 MG/ML IJ SUSP
40.0000 mg | INTRAMUSCULAR | Status: AC | PRN
  Administered 2022-09-09: 40 mg via INTRA_ARTICULAR

## 2022-09-09 MED ORDER — LIDOCAINE HCL 1 % IJ SOLN
4.0000 mL | INTRAMUSCULAR | Status: AC | PRN
  Administered 2022-09-09: 4 mL

## 2022-09-09 NOTE — Progress Notes (Signed)
   Procedure Note  Patient: Jeff Watkins             Date of Birth: 1936-10-18           MRN: [id number]             Visit Date: 09/09/2022  Procedures: Visit Diagnoses:  1. Pain in left hip   2. Pain in right hip   3. Unilateral primary osteoarthritis, right hip   4. Unilateral primary osteoarthritis, left hip   5. Spinal stenosis of lumbar region with neurogenic claudication    Large Joint Inj: bilateral hip joint on 09/09/2022 1:23 PM Indications: pain Details: 22 G 3.5 in needle, ultrasound-guided anterior approach Medications (Right): 4 mL lidocaine 1 %; 40 mg methylPREDNISolone acetate 40 MG/ML Medications (Left): 4 mL lidocaine 1 %; 40 mg methylPREDNISolone acetate 40 MG/ML Outcome: tolerated well, no immediate complications  Procedure: US-guided intra-articular hip injection, right After discussion on risks/benefits/indications and informed verbal consent was obtained, a timeout was performed. Patient was lying supine on exam table. The hip was cleaned with betadine and alcohol swabs. Then utilizing ultrasound guidance, the patient's right femoral head and neck junction was identified and subsequently injected with 4:1 lidocaine:depomedrol via an in-plane approach with ultrasound visualization of the injectate administered into the hip joint. Patient tolerated procedure well without immediate complications.  Procedure: US-guided intra-articular hip injection, left After discussion on risks/benefits/indications and informed verbal consent was obtained, a timeout was performed. Patient was lying supine on exam table. The hip was cleaned with betadine and alcohol swabs. Then utilizing ultrasound guidance, the patient's left femoral head and neck junction was identified and subsequently injected with 4:1 lidocaine:depomedrol via an in-plane approach with ultrasound visualization of the injectate administered into the hip joint. Patient tolerated procedure well without immediate  complications.  Procedure, treatment alternatives, risks and benefits explained, specific risks discussed. Consent was given by the patient. Immediately prior to procedure a time out was called to verify the correct patient, procedure, equipment, support staff and site/side marked as required. Patient was prepped and draped in the usual sterile fashion.     - I evaluated the patient about 10 minutes post-injection and they had improvement in pain and range of motion - follow-up with myself or Dr. Alvester Morin as indicated - he does have a trip to see his brother upcoming in February --> he may represent about a week or so before this if his hips are bothering him for repeat injection if needed  Madelyn Brunner, DO Primary Care Sports Medicine Physician  [facility]  This note was dictated using Dragon naturally speaking software and may contain errors in syntax, spelling, or content which have not been identified prior to signing this note.

## 2022-09-16 DIAGNOSIS — E291 Testicular hypofunction: Secondary | ICD-10-CM | POA: Diagnosis not present

## 2022-09-16 DIAGNOSIS — N138 Other obstructive and reflux uropathy: Secondary | ICD-10-CM | POA: Diagnosis not present

## 2022-09-16 DIAGNOSIS — N401 Enlarged prostate with lower urinary tract symptoms: Secondary | ICD-10-CM | POA: Diagnosis not present

## 2022-09-25 ENCOUNTER — Other Ambulatory Visit (HOSPITAL_BASED_OUTPATIENT_CLINIC_OR_DEPARTMENT_OTHER): Payer: Self-pay | Admitting: Cardiovascular Disease

## 2022-09-29 ENCOUNTER — Other Ambulatory Visit (HOSPITAL_BASED_OUTPATIENT_CLINIC_OR_DEPARTMENT_OTHER): Payer: Self-pay | Admitting: Cardiovascular Disease

## 2022-09-29 NOTE — Telephone Encounter (Signed)
Rx request sent to pharmacy.  

## 2022-10-01 DIAGNOSIS — I959 Hypotension, unspecified: Secondary | ICD-10-CM | POA: Diagnosis not present

## 2022-10-01 DIAGNOSIS — R059 Cough, unspecified: Secondary | ICD-10-CM | POA: Diagnosis not present

## 2022-10-01 DIAGNOSIS — J209 Acute bronchitis, unspecified: Secondary | ICD-10-CM | POA: Diagnosis not present

## 2022-10-01 DIAGNOSIS — U071 COVID-19: Secondary | ICD-10-CM | POA: Diagnosis not present

## 2022-10-14 ENCOUNTER — Other Ambulatory Visit: Payer: Self-pay | Admitting: Internal Medicine

## 2022-10-14 ENCOUNTER — Ambulatory Visit
Admission: RE | Admit: 2022-10-14 | Discharge: 2022-10-14 | Disposition: A | Discharge status: Home / Self Care | Payer: Medicare PPO | Source: Ambulatory Visit | Attending: Internal Medicine | Admitting: Internal Medicine

## 2022-10-14 DIAGNOSIS — J45909 Unspecified asthma, uncomplicated: Secondary | ICD-10-CM

## 2022-10-14 DIAGNOSIS — S39012A Strain of muscle, fascia and tendon of lower back, initial encounter: Secondary | ICD-10-CM

## 2022-10-14 DIAGNOSIS — J45901 Unspecified asthma with (acute) exacerbation: Secondary | ICD-10-CM | POA: Diagnosis not present

## 2022-10-14 DIAGNOSIS — W19XXXA Unspecified fall, initial encounter: Secondary | ICD-10-CM | POA: Diagnosis not present

## 2022-10-14 DIAGNOSIS — K5901 Slow transit constipation: Secondary | ICD-10-CM | POA: Diagnosis not present

## 2022-10-14 DIAGNOSIS — I700 Atherosclerosis of aorta: Secondary | ICD-10-CM | POA: Diagnosis not present

## 2022-10-14 DIAGNOSIS — M47816 Spondylosis without myelopathy or radiculopathy, lumbar region: Secondary | ICD-10-CM | POA: Diagnosis not present

## 2022-10-14 DIAGNOSIS — M545 Low back pain, unspecified: Secondary | ICD-10-CM | POA: Diagnosis not present

## 2022-10-14 DIAGNOSIS — J984 Other disorders of lung: Secondary | ICD-10-CM | POA: Diagnosis not present

## 2022-10-16 ENCOUNTER — Encounter (HOSPITAL_BASED_OUTPATIENT_CLINIC_OR_DEPARTMENT_OTHER): Payer: Self-pay | Admitting: Emergency Medicine

## 2022-10-16 ENCOUNTER — Other Ambulatory Visit: Payer: Self-pay

## 2022-10-16 ENCOUNTER — Emergency Department (HOSPITAL_BASED_OUTPATIENT_CLINIC_OR_DEPARTMENT_OTHER): Payer: Medicare PPO

## 2022-10-16 ENCOUNTER — Emergency Department (HOSPITAL_BASED_OUTPATIENT_CLINIC_OR_DEPARTMENT_OTHER)
Admission: EM | Admit: 2022-10-16 | Discharge: 2022-10-16 | Disposition: A | Discharge status: Home / Self Care | Payer: Medicare PPO | Attending: Emergency Medicine | Admitting: Emergency Medicine

## 2022-10-16 DIAGNOSIS — Z79899 Other long term (current) drug therapy: Secondary | ICD-10-CM | POA: Insufficient documentation

## 2022-10-16 DIAGNOSIS — K5900 Constipation, unspecified: Secondary | ICD-10-CM | POA: Insufficient documentation

## 2022-10-16 DIAGNOSIS — K6389 Other specified diseases of intestine: Secondary | ICD-10-CM | POA: Diagnosis not present

## 2022-10-16 DIAGNOSIS — K5939 Other megacolon: Secondary | ICD-10-CM | POA: Diagnosis not present

## 2022-10-16 DIAGNOSIS — S2241XA Multiple fractures of ribs, right side, initial encounter for closed fracture: Secondary | ICD-10-CM | POA: Diagnosis not present

## 2022-10-16 MED ORDER — FLEET ENEMA 7-19 GM/118ML RE ENEM
2.0000 | ENEMA | Freq: Once | RECTAL | Status: AC
  Administered 2022-10-16: 2 via RECTAL
  Filled 2022-10-16: qty 2

## 2022-10-16 NOTE — ED Triage Notes (Signed)
Patient has tried OTC measures with no relief

## 2022-10-16 NOTE — ED Triage Notes (Signed)
Patient presents C/O of constipation and abdominal pain/distention. Reports he has had no BM in 8-9 days. Denies N/V

## 2022-10-16 NOTE — ED Provider Notes (Signed)
[facility] EMERGENCY DEPARTMENT Provider Note   CSN: [id number] Arrival date & time: 10/16/22  1059     History  Chief Complaint  Patient presents with   Constipation    Jeff Watkins is a 86 y.o. male.  Pt complains of constipation.  Pt reports he has tried otc medications and is on Linzess.   The history is provided by the patient. A language interpreter was used.  Constipation Severity:  Severe Time since last bowel movement:  8 days Timing:  Constant Progression:  Worsening Chronicity:  New Context: not narcotics   Stool description:  None produced Relieved by:  Nothing Worsened by:  Nothing Ineffective treatments:  Laxatives and stool softeners Associated symptoms: no abdominal pain   Risk factors: no change in medication        Home Medications Prior to Admission medications   Medication Sig Start Date End Date Taking? Authorizing Provider  albuterol (PROVENTIL HFA;VENTOLIN HFA) 108 (90 BASE) MCG/ACT inhaler Inhale 1-2 puffs into the lungs every 6 (six) hours as needed for wheezing or shortness of breath. Reported on 05/08/2016    [provider]  amLODipine (NORVASC) 10 MG tablet Take 10 mg by mouth at bedtime.     [provider]  atorvastatin (LIPITOR) 40 MG tablet Take 40 mg by mouth at bedtime.     [provider]  clopidogrel (PLAVIX) 75 MG tablet Take 75 mg by mouth daily with breakfast.    [provider]  COVID-19 mRNA Vac-TriS, Pfizer, (PFIZER-BIONT COVID-19 VAC-TRIS) SUSP injection Inject into the muscle. 04/02/21   Judyann Munson, MD  doxazosin (CARDURA) 8 MG tablet TAKE 1 TABLET BY MOUTH DAILY 04/01/22   [name], MD  empagliflozin (JARDIANCE) 10 MG TABS tablet Take 1 tablet (10 mg total) by mouth daily before breakfast. 08/06/22   [name], MD  ferrous sulfate 325 (65 FE) MG tablet Take 325 mg by mouth daily with breakfast.    [provider]  finasteride (PROSCAR) 5 MG tablet  Take 5 mg by mouth daily. 08/04/22   [provider]  Fish Oil-Cholecalciferol (FISH OIL + D3 PO) Take 1 capsule by mouth every morning.     [provider]  furosemide (LASIX) 20 MG tablet TAKE ONE (1) TABLET BY MOUTH EVERY DAY 09/29/22   [name], MD  hydrALAZINE (APRESOLINE) 100 MG tablet TAKE ONE (1) TABLET BY MOUTH TWO (2) TIMES DAILY 01/09/21   [name], MD  hydrochlorothiazide (HYDRODIURIL) 25 MG tablet Take 1 tablet (25 mg total) by mouth daily with breakfast. 08/06/22 08/01/23  [name], MD  loratadine (CLARITIN) 10 MG tablet Take 10 mg by mouth every morning.     [provider]  Multiple Vitamin (MULTI VITAMIN MENS PO) Take 1 tablet by mouth every evening.     [provider]  Polyethyl Glycol-Propyl Glycol (SYSTANE OP) Place 1 drop into both eyes at bedtime.     [provider]  sacubitril-valsartan (ENTRESTO) 97-103 MG Take 1 tablet by mouth 2 (two) times daily. 08/06/22   [name], MD  spironolactone (ALDACTONE) 25 MG tablet Take 12.5 mg by mouth daily.    [provider]  Testosterone 20.25 MG/1.25GM (1.62%) GEL Place 2 Pump onto the skin every other day. 12/06/20   [provider]  traMADol (ULTRAM) 50 MG tablet TAKE ONE (1) TABLET BY MOUTH EVERY 6 HOURS AS NEEDED 02/28/22   Kathryne Hitch, MD      Allergies  Elemental sulfur and Penicillins    Review of Systems   Review of Systems  Gastrointestinal:  Positive for constipation. Negative for abdominal pain.  All other systems reviewed and are negative.   Physical Exam Updated Vital Signs BP (!) 112/57   Pulse (!) 51   Temp 98.1 F (36.7 C)   Resp 20   Ht 5\' 8"  (1.727 m)   Wt 104.3 kg   SpO2 95%   BMI 34.97 kg/m  Physical Exam Vitals and nursing note reviewed.  Constitutional:      Appearance: He is well-developed.  HENT:     Head: Normocephalic.     Mouth/Throat:     Mouth: Mucous membranes are moist.   Cardiovascular:     Rate and Rhythm: Normal rate and regular rhythm.  Pulmonary:     Effort: Pulmonary effort is normal.  Abdominal:     General: Abdomen is flat. There is distension.  Genitourinary:    Comments: No stool in vault,  no impaction Musculoskeletal:        General: Normal range of motion.     Cervical back: Normal range of motion.  Skin:    General: Skin is warm.  Neurological:     General: No focal deficit present.     Mental Status: He is alert and oriented to person, place, and time.     ED Results / Procedures / Treatments   Labs (all labs ordered are listed, but only abnormal results are displayed) Labs Reviewed - No data to display  EKG None  Radiology DG Abdomen Acute W/Chest  Result Date: 10/16/2022 CLINICAL DATA:  Constipation and abdominal pain/distention. Several day history of no bowel movements EXAM: DG ABDOMEN ACUTE WITH 1 VIEW CHEST COMPARISON:  Chest radiograph dated 10/14/2022, lumbar spine radiograph dated 10/14/2022 FINDINGS: There is no free intraperitoneal air. There is moderate dilation of large bowel loops, for example the cecum measures 10.7 cm. No radiopaque calculi or other significant radiographic abnormality is seen. Large volume stool throughout the colon. Heart size and mediastinal contours are within normal limits. Right upper lung calcified granuloma. Otherwise no focal consolidations. Chronic right rib fractures. Callus formation at the left lateral approximately eighth rib in keeping with healing fracture. IMPRESSION: 1. Moderate dilation of large bowel loops with the cecum measures 10.7 cm. Large volume stool throughout the colon. 2. No acute cardiopulmonary process. Electronically Signed   By: 10/16/2022 M.D.   On: 10/16/2022 12:45    Procedures Procedures    Medications Ordered in ED Medications  sodium phosphate (FLEET) 7-19 GM/118ML enema 2 enema (2 enemas Rectal Given by Other 10/16/22 1345)    ED Course/ Medical Decision  Making/ A&P                           Medical Decision Making Pt complains of constipation.  Pt has tried otc medications with no relief Pt taking linzess witout relief.   Amount and/or Complexity of Data Reviewed Radiology: ordered and independent interpretation performed. Decision-making details documented in ED Course.    Details: AAS shows constipation.    Risk OTC drugs. Risk Details: No stool or impaction to remove.  Pt given a fleets enema and had a large bowel movement.  Pt given a second enema and had another large stool.  Pt advised to continue linzess.             Final Clinical Impression(s) / ED Diagnoses Final diagnoses:  Constipation, unspecified constipation type    Rx / DC Orders ED Discharge Orders     None      An After Visit Summary was printed and given to the patient.    Elson Areas, [place] 10/16/22 1524    [name], MD 10/22/22 [phone]

## 2022-10-16 NOTE — Discharge Instructions (Signed)
Continue Linzess.  Return if any problems.

## 2022-10-16 NOTE — ED Notes (Signed)
Second enema admin given (2/2 complete).

## 2022-10-19 DIAGNOSIS — K5730 Diverticulosis of large intestine without perforation or abscess without bleeding: Secondary | ICD-10-CM | POA: Diagnosis not present

## 2022-10-19 DIAGNOSIS — K5900 Constipation, unspecified: Secondary | ICD-10-CM | POA: Diagnosis not present

## 2022-10-19 DIAGNOSIS — Y998 Other external cause status: Secondary | ICD-10-CM | POA: Diagnosis not present

## 2022-10-19 DIAGNOSIS — Z8673 Personal history of transient ischemic attack (TIA), and cerebral infarction without residual deficits: Secondary | ICD-10-CM | POA: Diagnosis not present

## 2022-10-19 DIAGNOSIS — M549 Dorsalgia, unspecified: Secondary | ICD-10-CM | POA: Diagnosis not present

## 2022-10-19 DIAGNOSIS — I25118 Atherosclerotic heart disease of native coronary artery with other forms of angina pectoris: Secondary | ICD-10-CM | POA: Diagnosis not present

## 2022-10-19 DIAGNOSIS — M4854XA Collapsed vertebra, not elsewhere classified, thoracic region, initial encounter for fracture: Secondary | ICD-10-CM | POA: Diagnosis not present

## 2022-10-19 DIAGNOSIS — E119 Type 2 diabetes mellitus without complications: Secondary | ICD-10-CM | POA: Diagnosis not present

## 2022-10-19 DIAGNOSIS — I110 Hypertensive heart disease with heart failure: Secondary | ICD-10-CM | POA: Diagnosis not present

## 2022-10-19 DIAGNOSIS — S22070A Wedge compression fracture of T9-T10 vertebra, initial encounter for closed fracture: Secondary | ICD-10-CM | POA: Diagnosis not present

## 2022-10-19 DIAGNOSIS — R296 Repeated falls: Secondary | ICD-10-CM | POA: Diagnosis not present

## 2022-10-19 DIAGNOSIS — M47816 Spondylosis without myelopathy or radiculopathy, lumbar region: Secondary | ICD-10-CM | POA: Diagnosis not present

## 2022-10-19 DIAGNOSIS — R531 Weakness: Secondary | ICD-10-CM | POA: Diagnosis not present

## 2022-10-19 DIAGNOSIS — I4891 Unspecified atrial fibrillation: Secondary | ICD-10-CM | POA: Diagnosis not present

## 2022-10-19 DIAGNOSIS — I2510 Atherosclerotic heart disease of native coronary artery without angina pectoris: Secondary | ICD-10-CM | POA: Diagnosis not present

## 2022-10-19 DIAGNOSIS — Z7401 Bed confinement status: Secondary | ICD-10-CM | POA: Diagnosis not present

## 2022-10-19 DIAGNOSIS — S22078K Other fracture of T9-T10 vertebra, subsequent encounter for fracture with nonunion: Secondary | ICD-10-CM | POA: Diagnosis not present

## 2022-10-19 DIAGNOSIS — E878 Other disorders of electrolyte and fluid balance, not elsewhere classified: Secondary | ICD-10-CM | POA: Diagnosis not present

## 2022-10-19 DIAGNOSIS — E785 Hyperlipidemia, unspecified: Secondary | ICD-10-CM | POA: Diagnosis not present

## 2022-10-19 DIAGNOSIS — E871 Hypo-osmolality and hyponatremia: Secondary | ICD-10-CM | POA: Diagnosis not present

## 2022-10-19 DIAGNOSIS — Z4789 Encounter for other orthopedic aftercare: Secondary | ICD-10-CM | POA: Diagnosis not present

## 2022-10-19 DIAGNOSIS — I509 Heart failure, unspecified: Secondary | ICD-10-CM | POA: Diagnosis not present

## 2022-10-19 DIAGNOSIS — H9042 Sensorineural hearing loss, unilateral, left ear, with unrestricted hearing on the contralateral side: Secondary | ICD-10-CM | POA: Diagnosis not present

## 2022-10-19 DIAGNOSIS — E876 Hypokalemia: Secondary | ICD-10-CM | POA: Diagnosis not present

## 2022-10-19 DIAGNOSIS — S22070D Wedge compression fracture of T9-T10 vertebra, subsequent encounter for fracture with routine healing: Secondary | ICD-10-CM | POA: Diagnosis not present

## 2022-10-19 DIAGNOSIS — I252 Old myocardial infarction: Secondary | ICD-10-CM | POA: Diagnosis not present

## 2022-10-19 DIAGNOSIS — Z7901 Long term (current) use of anticoagulants: Secondary | ICD-10-CM | POA: Diagnosis not present

## 2022-10-19 DIAGNOSIS — S22078A Other fracture of T9-T10 vertebra, initial encounter for closed fracture: Secondary | ICD-10-CM | POA: Diagnosis not present

## 2022-10-19 DIAGNOSIS — N179 Acute kidney failure, unspecified: Secondary | ICD-10-CM | POA: Diagnosis not present

## 2022-10-19 DIAGNOSIS — I10 Essential (primary) hypertension: Secondary | ICD-10-CM | POA: Diagnosis not present

## 2022-10-19 DIAGNOSIS — E875 Hyperkalemia: Secondary | ICD-10-CM | POA: Diagnosis not present

## 2022-10-19 DIAGNOSIS — N281 Cyst of kidney, acquired: Secondary | ICD-10-CM | POA: Diagnosis not present

## 2022-10-19 DIAGNOSIS — E669 Obesity, unspecified: Secondary | ICD-10-CM | POA: Diagnosis not present

## 2022-10-19 DIAGNOSIS — N401 Enlarged prostate with lower urinary tract symptoms: Secondary | ICD-10-CM | POA: Diagnosis not present

## 2022-10-19 DIAGNOSIS — R5381 Other malaise: Secondary | ICD-10-CM | POA: Diagnosis not present

## 2022-10-19 DIAGNOSIS — K5903 Drug induced constipation: Secondary | ICD-10-CM | POA: Diagnosis not present

## 2022-10-19 DIAGNOSIS — X58XXXA Exposure to other specified factors, initial encounter: Secondary | ICD-10-CM | POA: Diagnosis not present

## 2022-10-19 DIAGNOSIS — E860 Dehydration: Secondary | ICD-10-CM | POA: Diagnosis not present

## 2022-10-19 DIAGNOSIS — I492 Junctional premature depolarization: Secondary | ICD-10-CM | POA: Diagnosis not present

## 2022-10-19 DIAGNOSIS — S22079A Unspecified fracture of T9-T10 vertebra, initial encounter for closed fracture: Secondary | ICD-10-CM | POA: Diagnosis not present

## 2022-10-20 DIAGNOSIS — E875 Hyperkalemia: Secondary | ICD-10-CM | POA: Diagnosis not present

## 2022-10-20 DIAGNOSIS — S22070A Wedge compression fracture of T9-T10 vertebra, initial encounter for closed fracture: Secondary | ICD-10-CM | POA: Diagnosis not present

## 2022-10-20 DIAGNOSIS — I492 Junctional premature depolarization: Secondary | ICD-10-CM | POA: Diagnosis not present

## 2022-10-20 DIAGNOSIS — R296 Repeated falls: Secondary | ICD-10-CM | POA: Diagnosis not present

## 2022-10-20 DIAGNOSIS — E860 Dehydration: Secondary | ICD-10-CM | POA: Diagnosis not present

## 2022-10-20 DIAGNOSIS — Z8673 Personal history of transient ischemic attack (TIA), and cerebral infarction without residual deficits: Secondary | ICD-10-CM | POA: Diagnosis not present

## 2022-10-20 DIAGNOSIS — E871 Hypo-osmolality and hyponatremia: Secondary | ICD-10-CM | POA: Diagnosis not present

## 2022-10-21 DIAGNOSIS — E875 Hyperkalemia: Secondary | ICD-10-CM | POA: Diagnosis not present

## 2022-10-21 DIAGNOSIS — Z8673 Personal history of transient ischemic attack (TIA), and cerebral infarction without residual deficits: Secondary | ICD-10-CM | POA: Diagnosis not present

## 2022-10-21 DIAGNOSIS — N401 Enlarged prostate with lower urinary tract symptoms: Secondary | ICD-10-CM | POA: Diagnosis not present

## 2022-10-21 DIAGNOSIS — S22070A Wedge compression fracture of T9-T10 vertebra, initial encounter for closed fracture: Secondary | ICD-10-CM | POA: Diagnosis not present

## 2022-10-21 DIAGNOSIS — E871 Hypo-osmolality and hyponatremia: Secondary | ICD-10-CM | POA: Diagnosis not present

## 2022-10-21 DIAGNOSIS — E860 Dehydration: Secondary | ICD-10-CM | POA: Diagnosis not present

## 2022-10-22 DIAGNOSIS — E875 Hyperkalemia: Secondary | ICD-10-CM | POA: Diagnosis not present

## 2022-10-22 DIAGNOSIS — S22070A Wedge compression fracture of T9-T10 vertebra, initial encounter for closed fracture: Secondary | ICD-10-CM | POA: Diagnosis not present

## 2022-10-22 DIAGNOSIS — Z8673 Personal history of transient ischemic attack (TIA), and cerebral infarction without residual deficits: Secondary | ICD-10-CM | POA: Diagnosis not present

## 2022-10-22 DIAGNOSIS — E871 Hypo-osmolality and hyponatremia: Secondary | ICD-10-CM | POA: Diagnosis not present

## 2022-10-22 DIAGNOSIS — N401 Enlarged prostate with lower urinary tract symptoms: Secondary | ICD-10-CM | POA: Diagnosis not present

## 2022-10-22 DIAGNOSIS — E860 Dehydration: Secondary | ICD-10-CM | POA: Diagnosis not present

## 2022-10-23 DIAGNOSIS — I110 Hypertensive heart disease with heart failure: Secondary | ICD-10-CM | POA: Diagnosis not present

## 2022-10-23 DIAGNOSIS — E871 Hypo-osmolality and hyponatremia: Secondary | ICD-10-CM | POA: Diagnosis not present

## 2022-10-23 DIAGNOSIS — I509 Heart failure, unspecified: Secondary | ICD-10-CM | POA: Diagnosis not present

## 2022-10-23 DIAGNOSIS — E875 Hyperkalemia: Secondary | ICD-10-CM | POA: Diagnosis not present

## 2022-10-23 DIAGNOSIS — R296 Repeated falls: Secondary | ICD-10-CM | POA: Diagnosis not present

## 2022-10-23 DIAGNOSIS — I2510 Atherosclerotic heart disease of native coronary artery without angina pectoris: Secondary | ICD-10-CM | POA: Diagnosis not present

## 2022-10-23 DIAGNOSIS — Z8673 Personal history of transient ischemic attack (TIA), and cerebral infarction without residual deficits: Secondary | ICD-10-CM | POA: Diagnosis not present

## 2022-10-23 DIAGNOSIS — S22070A Wedge compression fracture of T9-T10 vertebra, initial encounter for closed fracture: Secondary | ICD-10-CM | POA: Diagnosis not present

## 2022-10-23 DIAGNOSIS — R5381 Other malaise: Secondary | ICD-10-CM | POA: Diagnosis not present

## 2022-10-24 DIAGNOSIS — E871 Hypo-osmolality and hyponatremia: Secondary | ICD-10-CM | POA: Diagnosis not present

## 2022-10-24 DIAGNOSIS — I110 Hypertensive heart disease with heart failure: Secondary | ICD-10-CM | POA: Diagnosis not present

## 2022-10-24 DIAGNOSIS — I2510 Atherosclerotic heart disease of native coronary artery without angina pectoris: Secondary | ICD-10-CM | POA: Diagnosis not present

## 2022-10-24 DIAGNOSIS — S22070A Wedge compression fracture of T9-T10 vertebra, initial encounter for closed fracture: Secondary | ICD-10-CM | POA: Diagnosis not present

## 2022-10-24 DIAGNOSIS — Z8673 Personal history of transient ischemic attack (TIA), and cerebral infarction without residual deficits: Secondary | ICD-10-CM | POA: Diagnosis not present

## 2022-10-24 DIAGNOSIS — E875 Hyperkalemia: Secondary | ICD-10-CM | POA: Diagnosis not present

## 2022-10-24 DIAGNOSIS — R296 Repeated falls: Secondary | ICD-10-CM | POA: Diagnosis not present

## 2022-10-24 DIAGNOSIS — I509 Heart failure, unspecified: Secondary | ICD-10-CM | POA: Diagnosis not present

## 2022-10-24 DIAGNOSIS — R5381 Other malaise: Secondary | ICD-10-CM | POA: Diagnosis not present

## 2022-10-25 DIAGNOSIS — I2510 Atherosclerotic heart disease of native coronary artery without angina pectoris: Secondary | ICD-10-CM | POA: Diagnosis not present

## 2022-10-25 DIAGNOSIS — Z7401 Bed confinement status: Secondary | ICD-10-CM | POA: Diagnosis not present

## 2022-10-25 DIAGNOSIS — I509 Heart failure, unspecified: Secondary | ICD-10-CM | POA: Diagnosis not present

## 2022-10-25 DIAGNOSIS — E871 Hypo-osmolality and hyponatremia: Secondary | ICD-10-CM | POA: Diagnosis not present

## 2022-10-25 DIAGNOSIS — S22070D Wedge compression fracture of T9-T10 vertebra, subsequent encounter for fracture with routine healing: Secondary | ICD-10-CM | POA: Diagnosis not present

## 2022-10-25 DIAGNOSIS — S22070A Wedge compression fracture of T9-T10 vertebra, initial encounter for closed fracture: Secondary | ICD-10-CM | POA: Diagnosis not present

## 2022-10-25 DIAGNOSIS — I4891 Unspecified atrial fibrillation: Secondary | ICD-10-CM | POA: Diagnosis not present

## 2022-10-25 DIAGNOSIS — S22079A Unspecified fracture of T9-T10 vertebra, initial encounter for closed fracture: Secondary | ICD-10-CM | POA: Diagnosis not present

## 2022-10-25 DIAGNOSIS — K5900 Constipation, unspecified: Secondary | ICD-10-CM | POA: Diagnosis not present

## 2022-10-25 DIAGNOSIS — I10 Essential (primary) hypertension: Secondary | ICD-10-CM | POA: Diagnosis not present

## 2022-10-25 DIAGNOSIS — R531 Weakness: Secondary | ICD-10-CM | POA: Diagnosis not present

## 2022-10-25 DIAGNOSIS — H9042 Sensorineural hearing loss, unilateral, left ear, with unrestricted hearing on the contralateral side: Secondary | ICD-10-CM | POA: Diagnosis not present

## 2022-10-25 DIAGNOSIS — I639 Cerebral infarction, unspecified: Secondary | ICD-10-CM | POA: Diagnosis not present

## 2022-10-25 DIAGNOSIS — I252 Old myocardial infarction: Secondary | ICD-10-CM | POA: Diagnosis not present

## 2022-10-25 DIAGNOSIS — Z4789 Encounter for other orthopedic aftercare: Secondary | ICD-10-CM | POA: Diagnosis not present

## 2022-10-25 DIAGNOSIS — N179 Acute kidney failure, unspecified: Secondary | ICD-10-CM | POA: Diagnosis not present

## 2022-10-25 DIAGNOSIS — E119 Type 2 diabetes mellitus without complications: Secondary | ICD-10-CM | POA: Diagnosis not present

## 2022-10-25 DIAGNOSIS — I25118 Atherosclerotic heart disease of native coronary artery with other forms of angina pectoris: Secondary | ICD-10-CM | POA: Diagnosis not present

## 2022-10-29 DIAGNOSIS — I639 Cerebral infarction, unspecified: Secondary | ICD-10-CM | POA: Diagnosis not present

## 2022-10-29 DIAGNOSIS — K5900 Constipation, unspecified: Secondary | ICD-10-CM | POA: Diagnosis not present

## 2022-10-29 DIAGNOSIS — N179 Acute kidney failure, unspecified: Secondary | ICD-10-CM | POA: Diagnosis not present

## 2022-10-29 DIAGNOSIS — I10 Essential (primary) hypertension: Secondary | ICD-10-CM | POA: Diagnosis not present

## 2022-10-29 DIAGNOSIS — I2510 Atherosclerotic heart disease of native coronary artery without angina pectoris: Secondary | ICD-10-CM | POA: Diagnosis not present

## 2022-10-30 DIAGNOSIS — K5900 Constipation, unspecified: Secondary | ICD-10-CM | POA: Diagnosis not present

## 2022-10-30 DIAGNOSIS — E871 Hypo-osmolality and hyponatremia: Secondary | ICD-10-CM | POA: Diagnosis not present

## 2022-10-30 DIAGNOSIS — I639 Cerebral infarction, unspecified: Secondary | ICD-10-CM | POA: Diagnosis not present

## 2022-10-30 DIAGNOSIS — S22070A Wedge compression fracture of T9-T10 vertebra, initial encounter for closed fracture: Secondary | ICD-10-CM | POA: Diagnosis not present

## 2022-11-05 DIAGNOSIS — I2510 Atherosclerotic heart disease of native coronary artery without angina pectoris: Secondary | ICD-10-CM | POA: Diagnosis not present

## 2022-11-05 DIAGNOSIS — I639 Cerebral infarction, unspecified: Secondary | ICD-10-CM | POA: Diagnosis not present

## 2022-11-05 DIAGNOSIS — K5900 Constipation, unspecified: Secondary | ICD-10-CM | POA: Diagnosis not present

## 2022-11-05 DIAGNOSIS — I509 Heart failure, unspecified: Secondary | ICD-10-CM | POA: Diagnosis not present

## 2022-11-05 DIAGNOSIS — I10 Essential (primary) hypertension: Secondary | ICD-10-CM | POA: Diagnosis not present

## 2022-11-07 ENCOUNTER — Telehealth: Payer: Self-pay | Admitting: Physical Medicine and Rehabilitation

## 2022-11-07 NOTE — Telephone Encounter (Signed)
Received call from [place] with [facility] needing to schedule an appointment with Dr. Ernestina Patches for the patient. The number to contact [place] is [phone]

## 2022-11-10 NOTE — Telephone Encounter (Signed)
Tried calling to discuss, no answer.

## 2022-11-13 DIAGNOSIS — R269 Unspecified abnormalities of gait and mobility: Secondary | ICD-10-CM | POA: Diagnosis not present

## 2022-11-13 DIAGNOSIS — R54 Age-related physical debility: Secondary | ICD-10-CM | POA: Diagnosis not present

## 2022-11-13 DIAGNOSIS — Z4789 Encounter for other orthopedic aftercare: Secondary | ICD-10-CM | POA: Diagnosis not present

## 2022-11-13 DIAGNOSIS — S22070D Wedge compression fracture of T9-T10 vertebra, subsequent encounter for fracture with routine healing: Secondary | ICD-10-CM | POA: Diagnosis not present

## 2022-11-13 DIAGNOSIS — I130 Hypertensive heart and chronic kidney disease with heart failure and stage 1 through stage 4 chronic kidney disease, or unspecified chronic kidney disease: Secondary | ICD-10-CM | POA: Diagnosis not present

## 2022-11-13 DIAGNOSIS — K5900 Constipation, unspecified: Secondary | ICD-10-CM | POA: Diagnosis not present

## 2022-11-13 DIAGNOSIS — Z7901 Long term (current) use of anticoagulants: Secondary | ICD-10-CM | POA: Diagnosis not present

## 2022-11-13 DIAGNOSIS — Z9181 History of falling: Secondary | ICD-10-CM | POA: Diagnosis not present

## 2022-11-14 DIAGNOSIS — R54 Age-related physical debility: Secondary | ICD-10-CM | POA: Diagnosis not present

## 2022-11-14 DIAGNOSIS — S22070D Wedge compression fracture of T9-T10 vertebra, subsequent encounter for fracture with routine healing: Secondary | ICD-10-CM | POA: Diagnosis not present

## 2022-11-14 DIAGNOSIS — Z4789 Encounter for other orthopedic aftercare: Secondary | ICD-10-CM | POA: Diagnosis not present

## 2022-11-14 DIAGNOSIS — K5900 Constipation, unspecified: Secondary | ICD-10-CM | POA: Diagnosis not present

## 2022-11-14 DIAGNOSIS — I130 Hypertensive heart and chronic kidney disease with heart failure and stage 1 through stage 4 chronic kidney disease, or unspecified chronic kidney disease: Secondary | ICD-10-CM | POA: Diagnosis not present

## 2022-11-14 DIAGNOSIS — Z7901 Long term (current) use of anticoagulants: Secondary | ICD-10-CM | POA: Diagnosis not present

## 2022-11-14 DIAGNOSIS — Z9181 History of falling: Secondary | ICD-10-CM | POA: Diagnosis not present

## 2022-11-14 DIAGNOSIS — R269 Unspecified abnormalities of gait and mobility: Secondary | ICD-10-CM | POA: Diagnosis not present

## 2022-11-18 DIAGNOSIS — S22078D Other fracture of T9-T10 vertebra, subsequent encounter for fracture with routine healing: Secondary | ICD-10-CM | POA: Diagnosis not present

## 2022-11-18 DIAGNOSIS — S22070D Wedge compression fracture of T9-T10 vertebra, subsequent encounter for fracture with routine healing: Secondary | ICD-10-CM | POA: Diagnosis not present

## 2022-11-19 DIAGNOSIS — S22070D Wedge compression fracture of T9-T10 vertebra, subsequent encounter for fracture with routine healing: Secondary | ICD-10-CM | POA: Diagnosis not present

## 2022-11-19 DIAGNOSIS — R269 Unspecified abnormalities of gait and mobility: Secondary | ICD-10-CM | POA: Diagnosis not present

## 2022-11-19 DIAGNOSIS — R54 Age-related physical debility: Secondary | ICD-10-CM | POA: Diagnosis not present

## 2022-11-19 DIAGNOSIS — R058 Other specified cough: Secondary | ICD-10-CM | POA: Diagnosis not present

## 2022-11-19 DIAGNOSIS — Z4789 Encounter for other orthopedic aftercare: Secondary | ICD-10-CM | POA: Diagnosis not present

## 2022-11-19 DIAGNOSIS — R062 Wheezing: Secondary | ICD-10-CM | POA: Diagnosis not present

## 2022-11-19 DIAGNOSIS — Z9181 History of falling: Secondary | ICD-10-CM | POA: Diagnosis not present

## 2022-11-19 DIAGNOSIS — Z7901 Long term (current) use of anticoagulants: Secondary | ICD-10-CM | POA: Diagnosis not present

## 2022-11-19 DIAGNOSIS — I130 Hypertensive heart and chronic kidney disease with heart failure and stage 1 through stage 4 chronic kidney disease, or unspecified chronic kidney disease: Secondary | ICD-10-CM | POA: Diagnosis not present

## 2022-11-19 DIAGNOSIS — K5900 Constipation, unspecified: Secondary | ICD-10-CM | POA: Diagnosis not present

## 2022-11-20 DIAGNOSIS — S22070D Wedge compression fracture of T9-T10 vertebra, subsequent encounter for fracture with routine healing: Secondary | ICD-10-CM | POA: Diagnosis not present

## 2022-11-20 DIAGNOSIS — R2689 Other abnormalities of gait and mobility: Secondary | ICD-10-CM | POA: Diagnosis not present

## 2022-11-20 DIAGNOSIS — M6250 Muscle wasting and atrophy, not elsewhere classified, unspecified site: Secondary | ICD-10-CM | POA: Diagnosis not present

## 2022-11-20 DIAGNOSIS — M6281 Muscle weakness (generalized): Secondary | ICD-10-CM | POA: Diagnosis not present

## 2022-11-21 DIAGNOSIS — I509 Heart failure, unspecified: Secondary | ICD-10-CM | POA: Diagnosis not present

## 2022-11-21 DIAGNOSIS — S22070D Wedge compression fracture of T9-T10 vertebra, subsequent encounter for fracture with routine healing: Secondary | ICD-10-CM | POA: Diagnosis not present

## 2022-11-21 DIAGNOSIS — R338 Other retention of urine: Secondary | ICD-10-CM | POA: Diagnosis not present

## 2022-11-21 DIAGNOSIS — I110 Hypertensive heart disease with heart failure: Secondary | ICD-10-CM | POA: Diagnosis not present

## 2022-11-21 DIAGNOSIS — E785 Hyperlipidemia, unspecified: Secondary | ICD-10-CM | POA: Diagnosis not present

## 2022-11-21 DIAGNOSIS — W19XXXD Unspecified fall, subsequent encounter: Secondary | ICD-10-CM | POA: Diagnosis not present

## 2022-11-21 DIAGNOSIS — N401 Enlarged prostate with lower urinary tract symptoms: Secondary | ICD-10-CM | POA: Diagnosis not present

## 2022-11-21 DIAGNOSIS — I2510 Atherosclerotic heart disease of native coronary artery without angina pectoris: Secondary | ICD-10-CM | POA: Diagnosis not present

## 2022-11-21 DIAGNOSIS — E669 Obesity, unspecified: Secondary | ICD-10-CM | POA: Diagnosis not present

## 2022-11-24 DIAGNOSIS — I509 Heart failure, unspecified: Secondary | ICD-10-CM | POA: Diagnosis not present

## 2022-11-24 DIAGNOSIS — I2510 Atherosclerotic heart disease of native coronary artery without angina pectoris: Secondary | ICD-10-CM | POA: Diagnosis not present

## 2022-11-24 DIAGNOSIS — E785 Hyperlipidemia, unspecified: Secondary | ICD-10-CM | POA: Diagnosis not present

## 2022-11-24 DIAGNOSIS — S22070D Wedge compression fracture of T9-T10 vertebra, subsequent encounter for fracture with routine healing: Secondary | ICD-10-CM | POA: Diagnosis not present

## 2022-11-24 DIAGNOSIS — E669 Obesity, unspecified: Secondary | ICD-10-CM | POA: Diagnosis not present

## 2022-11-24 DIAGNOSIS — W19XXXD Unspecified fall, subsequent encounter: Secondary | ICD-10-CM | POA: Diagnosis not present

## 2022-11-24 DIAGNOSIS — N401 Enlarged prostate with lower urinary tract symptoms: Secondary | ICD-10-CM | POA: Diagnosis not present

## 2022-11-24 DIAGNOSIS — I110 Hypertensive heart disease with heart failure: Secondary | ICD-10-CM | POA: Diagnosis not present

## 2022-11-24 DIAGNOSIS — R338 Other retention of urine: Secondary | ICD-10-CM | POA: Diagnosis not present

## 2022-11-26 DIAGNOSIS — N401 Enlarged prostate with lower urinary tract symptoms: Secondary | ICD-10-CM | POA: Diagnosis not present

## 2022-11-26 DIAGNOSIS — W19XXXD Unspecified fall, subsequent encounter: Secondary | ICD-10-CM | POA: Diagnosis not present

## 2022-11-26 DIAGNOSIS — I2510 Atherosclerotic heart disease of native coronary artery without angina pectoris: Secondary | ICD-10-CM | POA: Diagnosis not present

## 2022-11-26 DIAGNOSIS — E669 Obesity, unspecified: Secondary | ICD-10-CM | POA: Diagnosis not present

## 2022-11-26 DIAGNOSIS — E785 Hyperlipidemia, unspecified: Secondary | ICD-10-CM | POA: Diagnosis not present

## 2022-11-26 DIAGNOSIS — R338 Other retention of urine: Secondary | ICD-10-CM | POA: Diagnosis not present

## 2022-11-26 DIAGNOSIS — S22070D Wedge compression fracture of T9-T10 vertebra, subsequent encounter for fracture with routine healing: Secondary | ICD-10-CM | POA: Diagnosis not present

## 2022-11-26 DIAGNOSIS — I110 Hypertensive heart disease with heart failure: Secondary | ICD-10-CM | POA: Diagnosis not present

## 2022-11-26 DIAGNOSIS — I509 Heart failure, unspecified: Secondary | ICD-10-CM | POA: Diagnosis not present

## 2022-11-28 DIAGNOSIS — I2510 Atherosclerotic heart disease of native coronary artery without angina pectoris: Secondary | ICD-10-CM | POA: Diagnosis not present

## 2022-11-28 DIAGNOSIS — I110 Hypertensive heart disease with heart failure: Secondary | ICD-10-CM | POA: Diagnosis not present

## 2022-11-28 DIAGNOSIS — R338 Other retention of urine: Secondary | ICD-10-CM | POA: Diagnosis not present

## 2022-11-28 DIAGNOSIS — N401 Enlarged prostate with lower urinary tract symptoms: Secondary | ICD-10-CM | POA: Diagnosis not present

## 2022-11-28 DIAGNOSIS — W19XXXD Unspecified fall, subsequent encounter: Secondary | ICD-10-CM | POA: Diagnosis not present

## 2022-11-28 DIAGNOSIS — S22070D Wedge compression fracture of T9-T10 vertebra, subsequent encounter for fracture with routine healing: Secondary | ICD-10-CM | POA: Diagnosis not present

## 2022-11-28 DIAGNOSIS — E785 Hyperlipidemia, unspecified: Secondary | ICD-10-CM | POA: Diagnosis not present

## 2022-11-28 DIAGNOSIS — I509 Heart failure, unspecified: Secondary | ICD-10-CM | POA: Diagnosis not present

## 2022-11-28 DIAGNOSIS — E669 Obesity, unspecified: Secondary | ICD-10-CM | POA: Diagnosis not present

## 2022-12-01 DIAGNOSIS — W19XXXD Unspecified fall, subsequent encounter: Secondary | ICD-10-CM | POA: Diagnosis not present

## 2022-12-01 DIAGNOSIS — R338 Other retention of urine: Secondary | ICD-10-CM | POA: Diagnosis not present

## 2022-12-01 DIAGNOSIS — N401 Enlarged prostate with lower urinary tract symptoms: Secondary | ICD-10-CM | POA: Diagnosis not present

## 2022-12-01 DIAGNOSIS — I2510 Atherosclerotic heart disease of native coronary artery without angina pectoris: Secondary | ICD-10-CM | POA: Diagnosis not present

## 2022-12-01 DIAGNOSIS — I509 Heart failure, unspecified: Secondary | ICD-10-CM | POA: Diagnosis not present

## 2022-12-01 DIAGNOSIS — I110 Hypertensive heart disease with heart failure: Secondary | ICD-10-CM | POA: Diagnosis not present

## 2022-12-01 DIAGNOSIS — S22070D Wedge compression fracture of T9-T10 vertebra, subsequent encounter for fracture with routine healing: Secondary | ICD-10-CM | POA: Diagnosis not present

## 2022-12-01 DIAGNOSIS — E669 Obesity, unspecified: Secondary | ICD-10-CM | POA: Diagnosis not present

## 2022-12-01 DIAGNOSIS — E785 Hyperlipidemia, unspecified: Secondary | ICD-10-CM | POA: Diagnosis not present

## 2022-12-02 ENCOUNTER — Other Ambulatory Visit (HOSPITAL_BASED_OUTPATIENT_CLINIC_OR_DEPARTMENT_OTHER): Payer: Self-pay | Admitting: Cardiovascular Disease

## 2022-12-02 NOTE — Telephone Encounter (Signed)
Rx request sent to pharmacy.  

## 2022-12-03 DIAGNOSIS — S22070D Wedge compression fracture of T9-T10 vertebra, subsequent encounter for fracture with routine healing: Secondary | ICD-10-CM | POA: Diagnosis not present

## 2022-12-03 DIAGNOSIS — I2510 Atherosclerotic heart disease of native coronary artery without angina pectoris: Secondary | ICD-10-CM | POA: Diagnosis not present

## 2022-12-03 DIAGNOSIS — R338 Other retention of urine: Secondary | ICD-10-CM | POA: Diagnosis not present

## 2022-12-03 DIAGNOSIS — I110 Hypertensive heart disease with heart failure: Secondary | ICD-10-CM | POA: Diagnosis not present

## 2022-12-03 DIAGNOSIS — W19XXXD Unspecified fall, subsequent encounter: Secondary | ICD-10-CM | POA: Diagnosis not present

## 2022-12-03 DIAGNOSIS — N401 Enlarged prostate with lower urinary tract symptoms: Secondary | ICD-10-CM | POA: Diagnosis not present

## 2022-12-03 DIAGNOSIS — I509 Heart failure, unspecified: Secondary | ICD-10-CM | POA: Diagnosis not present

## 2022-12-03 DIAGNOSIS — E669 Obesity, unspecified: Secondary | ICD-10-CM | POA: Diagnosis not present

## 2022-12-03 DIAGNOSIS — E785 Hyperlipidemia, unspecified: Secondary | ICD-10-CM | POA: Diagnosis not present

## 2022-12-04 DIAGNOSIS — H02834 Dermatochalasis of left upper eyelid: Secondary | ICD-10-CM | POA: Diagnosis not present

## 2022-12-04 DIAGNOSIS — H1045 Other chronic allergic conjunctivitis: Secondary | ICD-10-CM | POA: Diagnosis not present

## 2022-12-04 DIAGNOSIS — E119 Type 2 diabetes mellitus without complications: Secondary | ICD-10-CM | POA: Diagnosis not present

## 2022-12-04 DIAGNOSIS — H02831 Dermatochalasis of right upper eyelid: Secondary | ICD-10-CM | POA: Diagnosis not present

## 2022-12-04 DIAGNOSIS — Z961 Presence of intraocular lens: Secondary | ICD-10-CM | POA: Diagnosis not present

## 2022-12-04 DIAGNOSIS — H04123 Dry eye syndrome of bilateral lacrimal glands: Secondary | ICD-10-CM | POA: Diagnosis not present

## 2022-12-04 DIAGNOSIS — H04413 Chronic dacryocystitis of bilateral lacrimal passages: Secondary | ICD-10-CM | POA: Diagnosis not present

## 2022-12-04 DIAGNOSIS — H43811 Vitreous degeneration, right eye: Secondary | ICD-10-CM | POA: Diagnosis not present

## 2022-12-05 DIAGNOSIS — I509 Heart failure, unspecified: Secondary | ICD-10-CM | POA: Diagnosis not present

## 2022-12-05 DIAGNOSIS — E785 Hyperlipidemia, unspecified: Secondary | ICD-10-CM | POA: Diagnosis not present

## 2022-12-05 DIAGNOSIS — R338 Other retention of urine: Secondary | ICD-10-CM | POA: Diagnosis not present

## 2022-12-05 DIAGNOSIS — I110 Hypertensive heart disease with heart failure: Secondary | ICD-10-CM | POA: Diagnosis not present

## 2022-12-05 DIAGNOSIS — I2510 Atherosclerotic heart disease of native coronary artery without angina pectoris: Secondary | ICD-10-CM | POA: Diagnosis not present

## 2022-12-05 DIAGNOSIS — N401 Enlarged prostate with lower urinary tract symptoms: Secondary | ICD-10-CM | POA: Diagnosis not present

## 2022-12-05 DIAGNOSIS — W19XXXD Unspecified fall, subsequent encounter: Secondary | ICD-10-CM | POA: Diagnosis not present

## 2022-12-05 DIAGNOSIS — S22070D Wedge compression fracture of T9-T10 vertebra, subsequent encounter for fracture with routine healing: Secondary | ICD-10-CM | POA: Diagnosis not present

## 2022-12-05 DIAGNOSIS — E669 Obesity, unspecified: Secondary | ICD-10-CM | POA: Diagnosis not present

## 2022-12-08 DIAGNOSIS — R338 Other retention of urine: Secondary | ICD-10-CM | POA: Diagnosis not present

## 2022-12-08 DIAGNOSIS — I700 Atherosclerosis of aorta: Secondary | ICD-10-CM | POA: Diagnosis not present

## 2022-12-08 DIAGNOSIS — S22070D Wedge compression fracture of T9-T10 vertebra, subsequent encounter for fracture with routine healing: Secondary | ICD-10-CM | POA: Diagnosis not present

## 2022-12-08 DIAGNOSIS — N1831 Chronic kidney disease, stage 3a: Secondary | ICD-10-CM | POA: Diagnosis not present

## 2022-12-08 DIAGNOSIS — E669 Obesity, unspecified: Secondary | ICD-10-CM | POA: Diagnosis not present

## 2022-12-08 DIAGNOSIS — I110 Hypertensive heart disease with heart failure: Secondary | ICD-10-CM | POA: Diagnosis not present

## 2022-12-08 DIAGNOSIS — I5032 Chronic diastolic (congestive) heart failure: Secondary | ICD-10-CM | POA: Diagnosis not present

## 2022-12-08 DIAGNOSIS — I509 Heart failure, unspecified: Secondary | ICD-10-CM | POA: Diagnosis not present

## 2022-12-08 DIAGNOSIS — M4696 Unspecified inflammatory spondylopathy, lumbar region: Secondary | ICD-10-CM | POA: Diagnosis not present

## 2022-12-08 DIAGNOSIS — W19XXXD Unspecified fall, subsequent encounter: Secondary | ICD-10-CM | POA: Diagnosis not present

## 2022-12-08 DIAGNOSIS — E785 Hyperlipidemia, unspecified: Secondary | ICD-10-CM | POA: Diagnosis not present

## 2022-12-08 DIAGNOSIS — I129 Hypertensive chronic kidney disease with stage 1 through stage 4 chronic kidney disease, or unspecified chronic kidney disease: Secondary | ICD-10-CM | POA: Diagnosis not present

## 2022-12-08 DIAGNOSIS — J8410 Pulmonary fibrosis, unspecified: Secondary | ICD-10-CM | POA: Diagnosis not present

## 2022-12-08 DIAGNOSIS — N401 Enlarged prostate with lower urinary tract symptoms: Secondary | ICD-10-CM | POA: Diagnosis not present

## 2022-12-08 DIAGNOSIS — G9529 Other cord compression: Secondary | ICD-10-CM | POA: Diagnosis not present

## 2022-12-08 DIAGNOSIS — I2510 Atherosclerotic heart disease of native coronary artery without angina pectoris: Secondary | ICD-10-CM | POA: Diagnosis not present

## 2022-12-08 DIAGNOSIS — E261 Secondary hyperaldosteronism: Secondary | ICD-10-CM | POA: Diagnosis not present

## 2022-12-08 DIAGNOSIS — I10 Essential (primary) hypertension: Secondary | ICD-10-CM | POA: Diagnosis not present

## 2022-12-09 DIAGNOSIS — W19XXXD Unspecified fall, subsequent encounter: Secondary | ICD-10-CM | POA: Diagnosis not present

## 2022-12-09 DIAGNOSIS — N401 Enlarged prostate with lower urinary tract symptoms: Secondary | ICD-10-CM | POA: Diagnosis not present

## 2022-12-09 DIAGNOSIS — S22070D Wedge compression fracture of T9-T10 vertebra, subsequent encounter for fracture with routine healing: Secondary | ICD-10-CM | POA: Diagnosis not present

## 2022-12-09 DIAGNOSIS — I2510 Atherosclerotic heart disease of native coronary artery without angina pectoris: Secondary | ICD-10-CM | POA: Diagnosis not present

## 2022-12-09 DIAGNOSIS — R338 Other retention of urine: Secondary | ICD-10-CM | POA: Diagnosis not present

## 2022-12-09 DIAGNOSIS — I509 Heart failure, unspecified: Secondary | ICD-10-CM | POA: Diagnosis not present

## 2022-12-09 DIAGNOSIS — E785 Hyperlipidemia, unspecified: Secondary | ICD-10-CM | POA: Diagnosis not present

## 2022-12-09 DIAGNOSIS — I110 Hypertensive heart disease with heart failure: Secondary | ICD-10-CM | POA: Diagnosis not present

## 2022-12-09 DIAGNOSIS — E669 Obesity, unspecified: Secondary | ICD-10-CM | POA: Diagnosis not present

## 2022-12-11 DIAGNOSIS — E785 Hyperlipidemia, unspecified: Secondary | ICD-10-CM | POA: Diagnosis not present

## 2022-12-11 DIAGNOSIS — I2510 Atherosclerotic heart disease of native coronary artery without angina pectoris: Secondary | ICD-10-CM | POA: Diagnosis not present

## 2022-12-11 DIAGNOSIS — I509 Heart failure, unspecified: Secondary | ICD-10-CM | POA: Diagnosis not present

## 2022-12-11 DIAGNOSIS — E669 Obesity, unspecified: Secondary | ICD-10-CM | POA: Diagnosis not present

## 2022-12-11 DIAGNOSIS — R338 Other retention of urine: Secondary | ICD-10-CM | POA: Diagnosis not present

## 2022-12-11 DIAGNOSIS — N401 Enlarged prostate with lower urinary tract symptoms: Secondary | ICD-10-CM | POA: Diagnosis not present

## 2022-12-11 DIAGNOSIS — S22070D Wedge compression fracture of T9-T10 vertebra, subsequent encounter for fracture with routine healing: Secondary | ICD-10-CM | POA: Diagnosis not present

## 2022-12-11 DIAGNOSIS — W19XXXD Unspecified fall, subsequent encounter: Secondary | ICD-10-CM | POA: Diagnosis not present

## 2022-12-11 DIAGNOSIS — I110 Hypertensive heart disease with heart failure: Secondary | ICD-10-CM | POA: Diagnosis not present

## 2022-12-15 DIAGNOSIS — I509 Heart failure, unspecified: Secondary | ICD-10-CM | POA: Diagnosis not present

## 2022-12-15 DIAGNOSIS — E785 Hyperlipidemia, unspecified: Secondary | ICD-10-CM | POA: Diagnosis not present

## 2022-12-15 DIAGNOSIS — S22070D Wedge compression fracture of T9-T10 vertebra, subsequent encounter for fracture with routine healing: Secondary | ICD-10-CM | POA: Diagnosis not present

## 2022-12-15 DIAGNOSIS — W19XXXD Unspecified fall, subsequent encounter: Secondary | ICD-10-CM | POA: Diagnosis not present

## 2022-12-15 DIAGNOSIS — I2510 Atherosclerotic heart disease of native coronary artery without angina pectoris: Secondary | ICD-10-CM | POA: Diagnosis not present

## 2022-12-15 DIAGNOSIS — N401 Enlarged prostate with lower urinary tract symptoms: Secondary | ICD-10-CM | POA: Diagnosis not present

## 2022-12-15 DIAGNOSIS — R338 Other retention of urine: Secondary | ICD-10-CM | POA: Diagnosis not present

## 2022-12-15 DIAGNOSIS — E669 Obesity, unspecified: Secondary | ICD-10-CM | POA: Diagnosis not present

## 2022-12-15 DIAGNOSIS — I110 Hypertensive heart disease with heart failure: Secondary | ICD-10-CM | POA: Diagnosis not present

## 2022-12-16 DIAGNOSIS — I110 Hypertensive heart disease with heart failure: Secondary | ICD-10-CM | POA: Diagnosis not present

## 2022-12-16 DIAGNOSIS — E785 Hyperlipidemia, unspecified: Secondary | ICD-10-CM | POA: Diagnosis not present

## 2022-12-16 DIAGNOSIS — I2510 Atherosclerotic heart disease of native coronary artery without angina pectoris: Secondary | ICD-10-CM | POA: Diagnosis not present

## 2022-12-16 DIAGNOSIS — I509 Heart failure, unspecified: Secondary | ICD-10-CM | POA: Diagnosis not present

## 2022-12-16 DIAGNOSIS — S22070D Wedge compression fracture of T9-T10 vertebra, subsequent encounter for fracture with routine healing: Secondary | ICD-10-CM | POA: Diagnosis not present

## 2022-12-16 DIAGNOSIS — E669 Obesity, unspecified: Secondary | ICD-10-CM | POA: Diagnosis not present

## 2022-12-16 DIAGNOSIS — N401 Enlarged prostate with lower urinary tract symptoms: Secondary | ICD-10-CM | POA: Diagnosis not present

## 2022-12-16 DIAGNOSIS — R338 Other retention of urine: Secondary | ICD-10-CM | POA: Diagnosis not present

## 2022-12-16 DIAGNOSIS — W19XXXD Unspecified fall, subsequent encounter: Secondary | ICD-10-CM | POA: Diagnosis not present

## 2022-12-18 DIAGNOSIS — N401 Enlarged prostate with lower urinary tract symptoms: Secondary | ICD-10-CM | POA: Diagnosis not present

## 2022-12-18 DIAGNOSIS — W19XXXD Unspecified fall, subsequent encounter: Secondary | ICD-10-CM | POA: Diagnosis not present

## 2022-12-18 DIAGNOSIS — E785 Hyperlipidemia, unspecified: Secondary | ICD-10-CM | POA: Diagnosis not present

## 2022-12-18 DIAGNOSIS — I509 Heart failure, unspecified: Secondary | ICD-10-CM | POA: Diagnosis not present

## 2022-12-18 DIAGNOSIS — R338 Other retention of urine: Secondary | ICD-10-CM | POA: Diagnosis not present

## 2022-12-18 DIAGNOSIS — E669 Obesity, unspecified: Secondary | ICD-10-CM | POA: Diagnosis not present

## 2022-12-18 DIAGNOSIS — S22070D Wedge compression fracture of T9-T10 vertebra, subsequent encounter for fracture with routine healing: Secondary | ICD-10-CM | POA: Diagnosis not present

## 2022-12-18 DIAGNOSIS — I2510 Atherosclerotic heart disease of native coronary artery without angina pectoris: Secondary | ICD-10-CM | POA: Diagnosis not present

## 2022-12-18 DIAGNOSIS — I110 Hypertensive heart disease with heart failure: Secondary | ICD-10-CM | POA: Diagnosis not present

## 2022-12-21 DIAGNOSIS — M6281 Muscle weakness (generalized): Secondary | ICD-10-CM | POA: Diagnosis not present

## 2022-12-21 DIAGNOSIS — R2689 Other abnormalities of gait and mobility: Secondary | ICD-10-CM | POA: Diagnosis not present

## 2022-12-21 DIAGNOSIS — M6250 Muscle wasting and atrophy, not elsewhere classified, unspecified site: Secondary | ICD-10-CM | POA: Diagnosis not present

## 2022-12-21 DIAGNOSIS — S22070D Wedge compression fracture of T9-T10 vertebra, subsequent encounter for fracture with routine healing: Secondary | ICD-10-CM | POA: Diagnosis not present

## 2022-12-22 DIAGNOSIS — N401 Enlarged prostate with lower urinary tract symptoms: Secondary | ICD-10-CM | POA: Diagnosis not present

## 2022-12-22 DIAGNOSIS — E669 Obesity, unspecified: Secondary | ICD-10-CM | POA: Diagnosis not present

## 2022-12-22 DIAGNOSIS — E785 Hyperlipidemia, unspecified: Secondary | ICD-10-CM | POA: Diagnosis not present

## 2022-12-22 DIAGNOSIS — I509 Heart failure, unspecified: Secondary | ICD-10-CM | POA: Diagnosis not present

## 2022-12-22 DIAGNOSIS — W19XXXD Unspecified fall, subsequent encounter: Secondary | ICD-10-CM | POA: Diagnosis not present

## 2022-12-22 DIAGNOSIS — R338 Other retention of urine: Secondary | ICD-10-CM | POA: Diagnosis not present

## 2022-12-22 DIAGNOSIS — I2510 Atherosclerotic heart disease of native coronary artery without angina pectoris: Secondary | ICD-10-CM | POA: Diagnosis not present

## 2022-12-22 DIAGNOSIS — S22070D Wedge compression fracture of T9-T10 vertebra, subsequent encounter for fracture with routine healing: Secondary | ICD-10-CM | POA: Diagnosis not present

## 2022-12-22 DIAGNOSIS — I110 Hypertensive heart disease with heart failure: Secondary | ICD-10-CM | POA: Diagnosis not present

## 2022-12-23 DIAGNOSIS — J4489 Other specified chronic obstructive pulmonary disease: Secondary | ICD-10-CM | POA: Diagnosis not present

## 2022-12-23 DIAGNOSIS — J309 Allergic rhinitis, unspecified: Secondary | ICD-10-CM | POA: Diagnosis not present

## 2022-12-23 DIAGNOSIS — Z833 Family history of diabetes mellitus: Secondary | ICD-10-CM | POA: Diagnosis not present

## 2022-12-23 DIAGNOSIS — Z6832 Body mass index (BMI) 32.0-32.9, adult: Secondary | ICD-10-CM | POA: Diagnosis not present

## 2022-12-23 DIAGNOSIS — K219 Gastro-esophageal reflux disease without esophagitis: Secondary | ICD-10-CM | POA: Diagnosis not present

## 2022-12-23 DIAGNOSIS — K5900 Constipation, unspecified: Secondary | ICD-10-CM | POA: Diagnosis not present

## 2022-12-23 DIAGNOSIS — E669 Obesity, unspecified: Secondary | ICD-10-CM | POA: Diagnosis not present

## 2022-12-23 DIAGNOSIS — E785 Hyperlipidemia, unspecified: Secondary | ICD-10-CM | POA: Diagnosis not present

## 2022-12-23 DIAGNOSIS — Z7902 Long term (current) use of antithrombotics/antiplatelets: Secondary | ICD-10-CM | POA: Diagnosis not present

## 2022-12-23 DIAGNOSIS — R32 Unspecified urinary incontinence: Secondary | ICD-10-CM | POA: Diagnosis not present

## 2022-12-23 DIAGNOSIS — N400 Enlarged prostate without lower urinary tract symptoms: Secondary | ICD-10-CM | POA: Diagnosis not present

## 2022-12-23 DIAGNOSIS — G4733 Obstructive sleep apnea (adult) (pediatric): Secondary | ICD-10-CM | POA: Diagnosis not present

## 2022-12-23 DIAGNOSIS — M1990 Unspecified osteoarthritis, unspecified site: Secondary | ICD-10-CM | POA: Diagnosis not present

## 2022-12-25 DIAGNOSIS — I2510 Atherosclerotic heart disease of native coronary artery without angina pectoris: Secondary | ICD-10-CM | POA: Diagnosis not present

## 2022-12-25 DIAGNOSIS — N401 Enlarged prostate with lower urinary tract symptoms: Secondary | ICD-10-CM | POA: Diagnosis not present

## 2022-12-25 DIAGNOSIS — I509 Heart failure, unspecified: Secondary | ICD-10-CM | POA: Diagnosis not present

## 2022-12-25 DIAGNOSIS — I110 Hypertensive heart disease with heart failure: Secondary | ICD-10-CM | POA: Diagnosis not present

## 2022-12-25 DIAGNOSIS — E669 Obesity, unspecified: Secondary | ICD-10-CM | POA: Diagnosis not present

## 2022-12-25 DIAGNOSIS — R338 Other retention of urine: Secondary | ICD-10-CM | POA: Diagnosis not present

## 2022-12-25 DIAGNOSIS — S22070D Wedge compression fracture of T9-T10 vertebra, subsequent encounter for fracture with routine healing: Secondary | ICD-10-CM | POA: Diagnosis not present

## 2022-12-25 DIAGNOSIS — W19XXXD Unspecified fall, subsequent encounter: Secondary | ICD-10-CM | POA: Diagnosis not present

## 2022-12-25 DIAGNOSIS — E785 Hyperlipidemia, unspecified: Secondary | ICD-10-CM | POA: Diagnosis not present

## 2022-12-30 DIAGNOSIS — S22070D Wedge compression fracture of T9-T10 vertebra, subsequent encounter for fracture with routine healing: Secondary | ICD-10-CM | POA: Diagnosis not present

## 2023-01-12 ENCOUNTER — Ambulatory Visit (HOSPITAL_BASED_OUTPATIENT_CLINIC_OR_DEPARTMENT_OTHER): Payer: Medicare PPO | Admitting: Cardiovascular Disease

## 2023-01-12 ENCOUNTER — Encounter (HOSPITAL_BASED_OUTPATIENT_CLINIC_OR_DEPARTMENT_OTHER): Payer: Self-pay | Admitting: Cardiovascular Disease

## 2023-01-12 VITALS — BP 130/52 | HR 57 | Ht 68.0 in | Wt 214.0 lb

## 2023-01-12 DIAGNOSIS — I639 Cerebral infarction, unspecified: Secondary | ICD-10-CM

## 2023-01-12 DIAGNOSIS — I2510 Atherosclerotic heart disease of native coronary artery without angina pectoris: Secondary | ICD-10-CM | POA: Diagnosis not present

## 2023-01-12 DIAGNOSIS — I10 Essential (primary) hypertension: Secondary | ICD-10-CM

## 2023-01-12 DIAGNOSIS — I5032 Chronic diastolic (congestive) heart failure: Secondary | ICD-10-CM

## 2023-01-12 NOTE — Patient Instructions (Signed)
Medication Instructions:  Your physician recommends that you continue on your current medications as directed. Please refer to the Current Medication list given to you today.   *If you need a refill on your cardiac medications before your next appointment, please call your pharmacy*  Lab Work: NONE   Testing/Procedures: NONE  Follow-Up: At [facility], you and your health needs are our priority.  As part of our continuing mission to provide you with exceptional heart care, we have created designated Provider Care Teams.  These Care Teams include your primary Cardiologist (physician) and Advanced Practice Providers (APPs -  Physician Assistants and Nurse Practitioners) who all work together to provide you with the care you need, when you need it.  We recommend signing up for the patient portal called "MyChart".  Sign up information is provided on this After Visit Summary.  MyChart is used to connect with patients for Virtual Visits (Telemedicine).  Patients are able to view lab/test results, encounter notes, upcoming appointments, etc.  Non-urgent messages can be sent to your provider as well.   To learn more about what you can do with MyChart, go to [email or web address].    Your next appointment:   6 month(s)  Provider:   Laurann Montana, NP   [place]

## 2023-01-12 NOTE — Progress Notes (Signed)
Cardiology Office Note  Date:  01/12/2023  ID:  Jeff Watkins, DOB 1936-09-27, MRN [id number]  PCP:  Leeroy Cha, MD  Cardiologist:   Skeet Latch, MD   No chief complaint on file.  History of Present Illness: Jeff Watkins is a 87 y.o. male with moderate CAD, hypertension, hyperlipidemia, OSA, GERD and diabetes who presents for follow up.  Jeff Watkins was first seen on 5/1 with a complaint of dyspnea on exertion.  He was referred for an echo that showed LVEF 55-60% and grade 2 diastolic dysfunction.  He also had an exercise Myoview that was negative for ischemia but did reveal frequent PVCs.  He exercised for 6 minutes on the Bruce protocol (5.4 METS). At that appointment his losartan/HCTZ was increased due to poorly-controlled hypertension. Hydralazine was then increased to 50mg  bid.  Jeff Watkins had PVCs and metoprolol was increased.  Jeff Watkins had a home health visit through his insurance.  He was noted to have some mild edema and his BP was elevated.  He also had carpal tunnel surgery.  He has had carpal tunnel in both arms.  There was concern for cardiac amyloidosis.  He was referred for an ATT amyloid scan that was negative.  Hydralazine was also increased due to poorly controlled hypertension.  Due to his exertional dyspnea his echo was repeated.  His echo 02/2020 revealed LVEF 60 to 65% with grade 2 diastolic dysfunction.     Jeff Watkins BP was averaging 124/62 but labile.  He was switched from metoprolol to carvedilol. He reported shortness of breath and was asked to increase his exercise. His blood pressure was elevated in the office but he was unsure what it was running at home. He was given a blood pressure cuff and was encouraged to check it regularly. He called the office and noted that he was not taking the spironolactone that was prescribed, so this was resumed 10/2020.   He reported persistent shortness of breath and fatigue. He noted that he had previously been unable to  tolerate his CPAP. He noted that he was bradycardic so carvedilol was stopped. He followed up with Laurann Montana, NP 06/2021 and his heart rate remained 53 despite stopping his beta blocker. He wore a 3 day Zio, which didn't show any significant pauses. He did have some atrial runs up to 30 beats, and rare PACs and PVCs. His wife passed away 2 weeks prior to that appointment.  At his last appointment he complained of stable shortness of breath with low exertion and easily becoming fatigued. He was unable to tolerate CPAP which was felt to be contributing to his fatigue and dyspnea. Blood pressures were well controlled. He appeared euvolemic on exam but was gaining weight. We stopped his valsartan/HCTZ and started Entresto 97/103 mg BID. Also started HCTZ 25 mg daily and Jardiance 10 mg daily. Spironolactone was later reduced due to hyperkalemia. He followed up with Laurann Montana, NP 08/2022. He noted unchanged energy levels but believed his breathing was slightly improved.  Today, he states that he is feeling fair. He had a fall in 09/2022 and suffered a fracture. He has completed rehab and physical therapy, and is staying with his son. At the time he had bronchitis and tested positive for Covid-19. He woke up at night and went to the restroom. Then he began coughing significantly and passed out, falling. He continues to complete his PT exercises about 3-4 times a week. He feels well with exercise aside from his  shortness of breath; no anginal symptoms. Generally he uses his rollator to assist with his walking. While in PT he was not given Lasix and noticed LE edema. This has resolved at this time. Since starting [place] he has not been feeling differently. Additionally he complains of more frequent vertigo episodes that usually occur when getting up from bed first thing in the morning. His episodes may last 4-5 minutes at most, or seconds. He denies any palpitations, chest pain, headaches,  syncope, orthopnea, or PND.  Past Medical History:  Diagnosis Date   Arthritis    Asthma    Bradycardia 10/14/2021   Bronchitis    CAD in native artery 04/18/2021   Carpal tunnel syndrome, bilateral    Chronic diastolic heart failure ([place]) 03/07/2020   Coronary artery disease    50-70% LAD by 2001 cath   Essential hypertension 02/25/2016   GERD (gastroesophageal reflux disease)    Hypercholesteremia    Hyperlipidemia 02/25/2016   Hypertension    Obesity (BMI 30-39.9) 02/25/2016   OSA (obstructive sleep apnea) 04/18/2021   Pre-diabetes    Shortness of breath 02/25/2016   Sleep apnea    does not use CPAP   Stroke ([place]) ~2010   mini stroke    Stroke ([place]) 04/18/2021    Past Surgical History:  Procedure Laterality Date   CARDIAC CATHETERIZATION  2001   CARPAL TUNNEL RELEASE Right 07/06/2018   Procedure: RIGHT CARPAL TUNNEL RELEASE, EXTENDED EXCISION;  Surgeon: Daryll Brod, MD;  Location: [place];  Service: Orthopedics;  Laterality: Right;   CARPAL TUNNEL RELEASE Left 04/12/2019   Procedure: CARPAL TUNNEL RELEASE;  Surgeon: Daryll Brod, MD;  Location: [place];  Service: Orthopedics;  Laterality: Left;   COLONOSCOPY WITH PROPOFOL N/A 05/21/2015   Procedure: COLONOSCOPY WITH PROPOFOL;  Surgeon: Garlan Fair, MD;  Location: [facility];  Service: Endoscopy;  Laterality: N/A;   EYE SURGERY     bilateral cataracts   FRACTURE SURGERY  1966   compound arm   HERNIA REPAIR     LAPAROSCOPIC APPENDECTOMY N/A 10/18/2013   Procedure: APPENDECTOMY LAPAROSCOPIC;  Surgeon: Gwenyth Ober, MD;  Location: [place];  Service: General;  Laterality: N/A;   SUBMANDIBULAR GLAND EXCISION Left 09/02/2017    Left submandibular gland and associated soft tissue and lymph nodes   SUBMANDIBULAR GLAND EXCISION Left 09/02/2017   Procedure: EXCISION LEFT SUBMANDIBULAR GLAND;  Surgeon: Izora Gala, MD;  Location: [place];  Service: ENT;  Laterality: Left;   TESTICLE REMOVAL  1965     Current Outpatient Medications  Medication Sig Dispense Refill   albuterol (PROVENTIL HFA;VENTOLIN HFA) 108 (90 BASE) MCG/ACT inhaler Inhale 1-2 puffs into the lungs every 6 (six) hours as needed for wheezing or shortness of breath. Reported on 05/08/2016     amLODipine (NORVASC) 10 MG tablet Take 10 mg by mouth at bedtime.      atorvastatin (LIPITOR) 40 MG tablet Take 40 mg by mouth at bedtime.      clopidogrel (PLAVIX) 75 MG tablet Take 75 mg by mouth daily with breakfast.     COVID-19 mRNA Vac-TriS, Pfizer, (PFIZER-BIONT COVID-19 VAC-TRIS) SUSP injection Inject into the muscle. 0.3 mL 0   doxazosin (CARDURA) 8 MG tablet TAKE 1 TABLET BY MOUTH DAILY 90 tablet 3   empagliflozin (JARDIANCE) 10 MG TABS tablet Take 1 tablet (10 mg total) by mouth daily before breakfast. 90 tablet 3   ferrous sulfate 325 (65 FE) MG tablet Take 325 mg  by mouth daily with breakfast.     finasteride (PROSCAR) 5 MG tablet Take 5 mg by mouth daily.     Fish Oil-Cholecalciferol (FISH OIL + D3 PO) Take 1 capsule by mouth every morning.      furosemide (LASIX) 20 MG tablet TAKE ONE (1) TABLET BY MOUTH EVERY DAY 90 tablet 3   hydrALAZINE (APRESOLINE) 100 MG tablet TAKE ONE (1) TABLET BY MOUTH TWO (2) TIMES DAILY 180 tablet 1   hydrochlorothiazide (HYDRODIURIL) 25 MG tablet Take 1 tablet (25 mg total) by mouth daily with breakfast. 90 tablet 3   loratadine (CLARITIN) 10 MG tablet Take 10 mg by mouth every morning.      meclizine (ANTIVERT) 12.5 MG tablet Take 12.5 mg by mouth 2 (two) times daily.     Multiple Vitamin (MULTI VITAMIN MENS PO) Take 1 tablet by mouth every evening.      Polyethyl Glycol-Propyl Glycol (SYSTANE OP) Place 1 drop into both eyes at bedtime.      sacubitril-valsartan (ENTRESTO) 97-103 MG Take 1 tablet by mouth 2 (two) times daily. 180 tablet 3   spironolactone (ALDACTONE) 25 MG tablet TAKE 1 TABLET BY MOUTH DAILY 90 tablet 1   Testosterone 20.25 MG/1.25GM (1.62%) GEL Place 2 Pump onto the skin  every other day.     traMADol (ULTRAM) 50 MG tablet TAKE ONE (1) TABLET BY MOUTH EVERY 6 HOURS AS NEEDED 30 tablet 0   No current facility-administered medications for this visit.    Allergies:   Elemental sulfur and Penicillins    Social History:  The patient  reports that he quit smoking about 62 years ago. His smoking use included cigarettes. He has a 0.13 pack-year smoking history. He has never used smokeless tobacco. He reports that he does not drink alcohol and does not use drugs.   Family History:  The patient's family history includes Cancer in his brother; HIV/AIDS (age of onset: 70) in his daughter; Heart disease in his father; Other in his son; Stroke in his father.    ROS:   Please see the history of present illness. (+) Shortness of breath (+) Dizziness All other systems are reviewed and negative.    PHYSICAL EXAM: VS:  BP (!) 130/52 (BP Location: Left Arm, Patient Position: Sitting, Cuff Size: Normal)   Pulse (!) 57   Ht 5\' 8"  (1.727 m)   Wt 214 lb (97.1 kg)   SpO2 93%   BMI 32.54 kg/m  , BMI Body mass index is 32.54 kg/m. GENERAL:  Well appearing HEENT: Pupils equal round and reactive, fundi not visualized, oral mucosa unremarkable NECK:  No jugular venous distention, waveform within normal limits, carotid upstroke brisk and symmetric, no bruits LUNGS:  Clear to auscultation bilaterally HEART:  RRR.  PMI not displaced or sustained,S1 and S2 within normal limits, no S3, no S4, no clicks, no rubs, no murmurs ABD:  Flat, positive bowel sounds normal in frequency in pitch, no bruits, no rebound, no guarding, no midline pulsatile mass, no hepatomegaly, no splenomegaly EXT:  1+ DP/PT bilaterally, no LE edema, no cyanosis no clubbing SKIN:  No rashes no nodules NEURO:  Cranial nerves II through XII grossly intact, motor grossly intact throughout PSYCH:  Cognitively intact, oriented to person place and time  EKG: EKG is personally reviewed. 08/06/22: EKG was not  ordered. 10/14/2021: EKG was not ordered. 04/18/2021: Sinus bradycardia. Rate 42 bpm. 09/26/2020: EKG was not ordered. 02/24/20: Sinus rhythm.  Rate 66 bpm.  06/24/18: Sinus bradycardia.  Rate 59 bpm.   02/25/16: sinus rhythm. Rate 63 bpm. Nonspecific T wave flattening.  Other studies reviewed:  Monitor 07/2021: 7 Day Zio Monitor   Quality: Fair.  Baseline artifact. Predominant rhythm: sinus bradycardia Average heart rate: 51 bpm Max heart rate: 81 bpm Min heart rate: 36 bpm Pauses >2.5 seconds: none   Several atrial runs up to 30 beats Rare PACs Rare PVCs  Echo 03/15/2020: 1. Left ventricular ejection fraction, by estimation, is 60 to 65%. The  left ventricle has normal function. The left ventricle has no regional  wall motion abnormalities. Left ventricular diastolic parameters are  consistent with Grade II diastolic  dysfunction (pseudonormalization).   2. Right ventricular systolic function is normal. The right ventricular  size is normal. There is mildly elevated pulmonary artery systolic  pressure.   3. Left atrial size was mildly dilated.   4. The mitral valve is normal in structure. No evidence of mitral valve  regurgitation. No evidence of mitral stenosis.   5. The aortic valve is tricuspid. Aortic valve regurgitation is mild. No  aortic stenosis is present.   6. Aortic dilatation noted. There is mild dilatation of the ascending  aorta measuring 42 mm.   7. The inferior vena cava is normal in size with greater than 50%  respiratory variability, suggesting right atrial pressure of 3 mmHg.  Myocardial Amyloid Imaging Planar and Spect 03/14/2020: The study is normal.   The study is normal. The study is not suggestive of TTR amyloidosis (visual score of 0 or H/CL ratio<1).  Renal Artery Duplex 03/14/2020: Summary:  Renal:     Right: Normal size right kidney. Normal right Resisitive Index.         Normal cortical thickness of right kidney. No evidence of         right  renal artery stenosis. RRV flow present.  Left:  Normal size of left kidney. Abnormal left Resisitve Index.         Normal cortical thickness of the left kidney. No evidence of         left renal artery stenosis. LRV flow present. Cyst(s) noted.         measuring 3.8 cm 3.8 cm.  Mesenteric:  Normal Celiac artery and Superior Mesenteric artery findings.   Echo 03/19/16: Study Conclusions   - Left ventricle: The cavity size was normal. There was   mild-moderate concentric hypertrophy. Systolic function was   normal. The estimated ejection fraction was in the range of 55%   to 60%. Wall motion was normal; there were no regional wall   motion abnormalities. Features are consistent with a pseudonormal   left ventricular filling pattern, with concomitant abnormal   relaxation and increased filling pressure (grade 2 diastolic   dysfunction). Doppler parameters are consistent with   indeterminate ventricular filling pressure. - Aortic valve: Transvalvular velocity was within the normal range.   There was no stenosis. There was no regurgitation. Valve area   (VTI): 3.1 cm^2. Valve area (Vmax): 3.11 cm^2. Valve area   (Vmean): 3.04 cm^2. - Mitral valve: Transvalvular velocity was within the normal range.   There was no evidence for stenosis. There was no regurgitation. - Left atrium: The appendage was moderately dilated. - Right ventricle: The cavity size was mildly dilated. Wall   thickness was normal. Systolic function was normal. - Tricuspid valve: There was mild regurgitation. - Pulmonary arteries: Systolic pressure was within the normal   range. PA peak pressure: 35 mm Hg (S). -  Inferior vena cava: The vessel was normal in size. The   respirophasic diameter changes were in the normal range (= 50%),   consistent with normal central venous pressure.  Exercise Myoview 03/19/16:  Nuclear stress EF: 59%. No wall motion abnormality The left ventricular ejection fraction is normal  (55-65%). There was no ST segment deviation noted during stress. There were frequent PVCs during exercise, rare couplet This is a low risk perfusion study, no ischemia identified. Question if frequent PVCs are contributing to dyspnea on exertion. Fair exercise effort of 6 minutes and 13 seconds.  Recent Labs: 04/11/2022: BNP 328.1; Hemoglobin 10.6; Platelets 191; TSH 2.560 08/25/2022: BUN 21; Creatinine, Ser 1.21; Potassium 4.9; Sodium 133   02/16/2018: Total cholesterol 135, triglycerides 185, HDL 42, LDL 55 Potassium 4.3, creatinine 1.03 ALT 9.0  Lipid Panel    Component Value Date/Time   CHOL  10/17/2007 0540    118        ATP III CLASSIFICATION:  <200     mg/dL   Desirable  200-239  mg/dL   Borderline High  >=240    mg/dL   High   TRIG 101 10/17/2007 0540   HDL 28 (L) 10/17/2007 0540   CHOLHDL 4.2 10/17/2007 0540   VLDL 20 10/17/2007 0540   LDLCALC  10/17/2007 0540    70        Total Cholesterol/HDL:CHD Risk Coronary Heart Disease Risk Table                     Men   Women  1/2 Average Risk   3.4   3.3      Wt Readings from Last 3 Encounters:  01/12/23 214 lb (97.1 kg)  10/16/22 230 lb (104.3 kg)  09/08/22 234 lb (106.1 kg)      ASSESSMENT AND PLAN:  No problem-specific Assessment & Plan notes found for this encounter.  # Hypertension:  Mr. Perl blood pressure remains well-controlled under his current medication regimen, which includes amlodipine, doxazosin, hydralazine, hydrochlorothiazide, and spironolactone.  #HFpEF: Mr. Shillito experienced swelling due to missed doses of Lasix while in rehabilitation but currently reports no issues with swelling.  Despite being on Entresto for heart failure, Mr. Baptist has not observed a significant improvement in symptoms. However, his fluid status remains stable, and his blood pressure is well-controlled.  Continue hydralazine, Entresto, Jardiance, furosemide, and spironolactone.  # CAD, native: # Hyperlipidemia:  He is  doing well and has no angina.  Continue amlodipine, atorvastatin, clopidogrel.  Continue with regular exercise.   Medication Adjustments/Labs and Tests Ordered: Current medicines are reviewed at length with the patient today.  Concerns regarding medicines are outlined above.   No orders of the defined types were placed in this encounter.  No orders of the defined types were placed in this encounter.  Patient Instructions  Medication Instructions:  Your physician recommends that you continue on your current medications as directed. Please refer to the Current Medication list given to you today.   *If you need a refill on your cardiac medications before your next appointment, please call your pharmacy*  Lab Work: NONE   Testing/Procedures: NONE  Follow-Up: At [facility], you and your health needs are our priority.  As part of our continuing mission to provide you with exceptional heart care, we have created designated Provider Care Teams.  These Care Teams include your primary Cardiologist (physician) and Advanced Practice Providers (APPs -  Physician Assistants and Nurse Practitioners) who all work  together to provide you with the care you need, when you need it.  We recommend signing up for the patient portal called "MyChart".  Sign up information is provided on this After Visit Summary.  MyChart is used to connect with patients for Virtual Visits (Telemedicine).  Patients are able to view lab/test results, encounter notes, upcoming appointments, etc.  Non-urgent messages can be sent to your provider as well.   To learn more about what you can do with MyChart, go to [email or web address].    Your next appointment:   6 month(s)  Provider:   Laurann Montana, NP   [place]     Disposition:  FU with APP in 6 months. FU with Alishia Lebo C. Oval Linsey, MD, [facility] in 1 year.  I,Mathew Stumpf,acting as a Education administrator for Skeet Latch, MD.,have documented all relevant  documentation on the behalf of Skeet Latch, MD,as directed by  Skeet Latch, MD while in the presence of Skeet Latch, MD.   I, Barry Oval Linsey, MD have reviewed all documentation for this visit.  The documentation of the exam, diagnosis, procedures, and orders on 01/12/2023 are all accurate and complete.  Signed, Lexx Monte C. Oval Linsey, [place], [facility]  01/12/2023 11:13 AM    [place]

## 2023-01-19 DIAGNOSIS — M6250 Muscle wasting and atrophy, not elsewhere classified, unspecified site: Secondary | ICD-10-CM | POA: Diagnosis not present

## 2023-01-19 DIAGNOSIS — M6281 Muscle weakness (generalized): Secondary | ICD-10-CM | POA: Diagnosis not present

## 2023-01-19 DIAGNOSIS — R2689 Other abnormalities of gait and mobility: Secondary | ICD-10-CM | POA: Diagnosis not present

## 2023-01-19 DIAGNOSIS — S22070D Wedge compression fracture of T9-T10 vertebra, subsequent encounter for fracture with routine healing: Secondary | ICD-10-CM | POA: Diagnosis not present

## 2023-01-26 DIAGNOSIS — N401 Enlarged prostate with lower urinary tract symptoms: Secondary | ICD-10-CM | POA: Diagnosis not present

## 2023-01-26 DIAGNOSIS — N138 Other obstructive and reflux uropathy: Secondary | ICD-10-CM | POA: Diagnosis not present

## 2023-01-26 DIAGNOSIS — E291 Testicular hypofunction: Secondary | ICD-10-CM | POA: Diagnosis not present

## 2023-01-26 DIAGNOSIS — N3281 Overactive bladder: Secondary | ICD-10-CM | POA: Diagnosis not present

## 2023-01-27 DIAGNOSIS — E291 Testicular hypofunction: Secondary | ICD-10-CM | POA: Diagnosis not present

## 2023-02-11 DIAGNOSIS — N1831 Chronic kidney disease, stage 3a: Secondary | ICD-10-CM | POA: Diagnosis not present

## 2023-02-11 DIAGNOSIS — I129 Hypertensive chronic kidney disease with stage 1 through stage 4 chronic kidney disease, or unspecified chronic kidney disease: Secondary | ICD-10-CM | POA: Diagnosis not present

## 2023-02-11 DIAGNOSIS — I5032 Chronic diastolic (congestive) heart failure: Secondary | ICD-10-CM | POA: Diagnosis not present

## 2023-02-11 DIAGNOSIS — J45991 Cough variant asthma: Secondary | ICD-10-CM | POA: Diagnosis not present

## 2023-02-11 DIAGNOSIS — K219 Gastro-esophageal reflux disease without esophagitis: Secondary | ICD-10-CM | POA: Diagnosis not present

## 2023-02-11 DIAGNOSIS — I10 Essential (primary) hypertension: Secondary | ICD-10-CM | POA: Diagnosis not present

## 2023-02-19 DIAGNOSIS — M6250 Muscle wasting and atrophy, not elsewhere classified, unspecified site: Secondary | ICD-10-CM | POA: Diagnosis not present

## 2023-02-19 DIAGNOSIS — M6281 Muscle weakness (generalized): Secondary | ICD-10-CM | POA: Diagnosis not present

## 2023-02-19 DIAGNOSIS — S22070D Wedge compression fracture of T9-T10 vertebra, subsequent encounter for fracture with routine healing: Secondary | ICD-10-CM | POA: Diagnosis not present

## 2023-02-19 DIAGNOSIS — R2689 Other abnormalities of gait and mobility: Secondary | ICD-10-CM | POA: Diagnosis not present

## 2023-03-05 ENCOUNTER — Other Ambulatory Visit (HOSPITAL_BASED_OUTPATIENT_CLINIC_OR_DEPARTMENT_OTHER): Payer: Self-pay | Admitting: Cardiovascular Disease

## 2023-03-09 DIAGNOSIS — R351 Nocturia: Secondary | ICD-10-CM | POA: Diagnosis not present

## 2023-03-09 DIAGNOSIS — R8290 Unspecified abnormal findings in urine: Secondary | ICD-10-CM | POA: Diagnosis not present

## 2023-03-09 DIAGNOSIS — N3281 Overactive bladder: Secondary | ICD-10-CM | POA: Diagnosis not present

## 2023-03-09 DIAGNOSIS — N401 Enlarged prostate with lower urinary tract symptoms: Secondary | ICD-10-CM | POA: Diagnosis not present

## 2023-03-09 DIAGNOSIS — E291 Testicular hypofunction: Secondary | ICD-10-CM | POA: Diagnosis not present

## 2023-03-09 DIAGNOSIS — N138 Other obstructive and reflux uropathy: Secondary | ICD-10-CM | POA: Diagnosis not present

## 2023-03-09 DIAGNOSIS — R3912 Poor urinary stream: Secondary | ICD-10-CM | POA: Diagnosis not present

## 2023-03-09 DIAGNOSIS — B9620 Unspecified Escherichia coli [E. coli] as the cause of diseases classified elsewhere: Secondary | ICD-10-CM | POA: Diagnosis not present

## 2023-03-09 DIAGNOSIS — R3914 Feeling of incomplete bladder emptying: Secondary | ICD-10-CM | POA: Diagnosis not present

## 2023-03-21 DIAGNOSIS — M6281 Muscle weakness (generalized): Secondary | ICD-10-CM | POA: Diagnosis not present

## 2023-03-21 DIAGNOSIS — M6250 Muscle wasting and atrophy, not elsewhere classified, unspecified site: Secondary | ICD-10-CM | POA: Diagnosis not present

## 2023-03-21 DIAGNOSIS — S22070D Wedge compression fracture of T9-T10 vertebra, subsequent encounter for fracture with routine healing: Secondary | ICD-10-CM | POA: Diagnosis not present

## 2023-03-21 DIAGNOSIS — R2689 Other abnormalities of gait and mobility: Secondary | ICD-10-CM | POA: Diagnosis not present

## 2023-04-21 DIAGNOSIS — M6250 Muscle wasting and atrophy, not elsewhere classified, unspecified site: Secondary | ICD-10-CM | POA: Diagnosis not present

## 2023-04-21 DIAGNOSIS — S22070D Wedge compression fracture of T9-T10 vertebra, subsequent encounter for fracture with routine healing: Secondary | ICD-10-CM | POA: Diagnosis not present

## 2023-04-21 DIAGNOSIS — R2689 Other abnormalities of gait and mobility: Secondary | ICD-10-CM | POA: Diagnosis not present

## 2023-04-21 DIAGNOSIS — M6281 Muscle weakness (generalized): Secondary | ICD-10-CM | POA: Diagnosis not present

## 2023-05-21 DIAGNOSIS — M6281 Muscle weakness (generalized): Secondary | ICD-10-CM | POA: Diagnosis not present

## 2023-05-21 DIAGNOSIS — R2689 Other abnormalities of gait and mobility: Secondary | ICD-10-CM | POA: Diagnosis not present

## 2023-05-21 DIAGNOSIS — M6250 Muscle wasting and atrophy, not elsewhere classified, unspecified site: Secondary | ICD-10-CM | POA: Diagnosis not present

## 2023-05-21 DIAGNOSIS — S22070D Wedge compression fracture of T9-T10 vertebra, subsequent encounter for fracture with routine healing: Secondary | ICD-10-CM | POA: Diagnosis not present

## 2023-05-22 DIAGNOSIS — N401 Enlarged prostate with lower urinary tract symptoms: Secondary | ICD-10-CM | POA: Diagnosis not present

## 2023-05-22 DIAGNOSIS — N3281 Overactive bladder: Secondary | ICD-10-CM | POA: Diagnosis not present

## 2023-05-22 DIAGNOSIS — E291 Testicular hypofunction: Secondary | ICD-10-CM | POA: Diagnosis not present

## 2023-05-22 DIAGNOSIS — N3289 Other specified disorders of bladder: Secondary | ICD-10-CM | POA: Diagnosis not present

## 2023-05-22 DIAGNOSIS — N138 Other obstructive and reflux uropathy: Secondary | ICD-10-CM | POA: Diagnosis not present

## 2023-06-03 DIAGNOSIS — I10 Essential (primary) hypertension: Secondary | ICD-10-CM | POA: Diagnosis not present

## 2023-06-03 DIAGNOSIS — Z1331 Encounter for screening for depression: Secondary | ICD-10-CM | POA: Diagnosis not present

## 2023-06-03 DIAGNOSIS — Z0000 Encounter for general adult medical examination without abnormal findings: Secondary | ICD-10-CM | POA: Diagnosis not present

## 2023-06-03 DIAGNOSIS — I2510 Atherosclerotic heart disease of native coronary artery without angina pectoris: Secondary | ICD-10-CM | POA: Diagnosis not present

## 2023-06-03 DIAGNOSIS — Z9181 History of falling: Secondary | ICD-10-CM | POA: Diagnosis not present

## 2023-06-21 DIAGNOSIS — R2689 Other abnormalities of gait and mobility: Secondary | ICD-10-CM | POA: Diagnosis not present

## 2023-06-21 DIAGNOSIS — S22070D Wedge compression fracture of T9-T10 vertebra, subsequent encounter for fracture with routine healing: Secondary | ICD-10-CM | POA: Diagnosis not present

## 2023-06-21 DIAGNOSIS — M6250 Muscle wasting and atrophy, not elsewhere classified, unspecified site: Secondary | ICD-10-CM | POA: Diagnosis not present

## 2023-06-21 DIAGNOSIS — M6281 Muscle weakness (generalized): Secondary | ICD-10-CM | POA: Diagnosis not present

## 2023-06-22 DIAGNOSIS — R54 Age-related physical debility: Secondary | ICD-10-CM | POA: Diagnosis not present

## 2023-06-22 DIAGNOSIS — E291 Testicular hypofunction: Secondary | ICD-10-CM | POA: Diagnosis not present

## 2023-06-22 DIAGNOSIS — I10 Essential (primary) hypertension: Secondary | ICD-10-CM | POA: Diagnosis not present

## 2023-06-22 DIAGNOSIS — I5032 Chronic diastolic (congestive) heart failure: Secondary | ICD-10-CM | POA: Diagnosis not present

## 2023-06-22 DIAGNOSIS — R7303 Prediabetes: Secondary | ICD-10-CM | POA: Diagnosis not present

## 2023-06-22 DIAGNOSIS — N1831 Chronic kidney disease, stage 3a: Secondary | ICD-10-CM | POA: Diagnosis not present

## 2023-06-22 DIAGNOSIS — G4733 Obstructive sleep apnea (adult) (pediatric): Secondary | ICD-10-CM | POA: Diagnosis not present

## 2023-06-22 DIAGNOSIS — I2510 Atherosclerotic heart disease of native coronary artery without angina pectoris: Secondary | ICD-10-CM | POA: Diagnosis not present

## 2023-06-22 DIAGNOSIS — Z0000 Encounter for general adult medical examination without abnormal findings: Secondary | ICD-10-CM | POA: Diagnosis not present

## 2023-06-30 ENCOUNTER — Other Ambulatory Visit (HOSPITAL_BASED_OUTPATIENT_CLINIC_OR_DEPARTMENT_OTHER): Payer: Self-pay | Admitting: Cardiovascular Disease

## 2023-06-30 NOTE — Telephone Encounter (Signed)
Rx request sent to pharmacy.  

## 2023-07-22 DIAGNOSIS — R2689 Other abnormalities of gait and mobility: Secondary | ICD-10-CM | POA: Diagnosis not present

## 2023-07-22 DIAGNOSIS — M6250 Muscle wasting and atrophy, not elsewhere classified, unspecified site: Secondary | ICD-10-CM | POA: Diagnosis not present

## 2023-07-22 DIAGNOSIS — M6281 Muscle weakness (generalized): Secondary | ICD-10-CM | POA: Diagnosis not present

## 2023-07-22 DIAGNOSIS — S22070D Wedge compression fracture of T9-T10 vertebra, subsequent encounter for fracture with routine healing: Secondary | ICD-10-CM | POA: Diagnosis not present

## 2023-08-10 ENCOUNTER — Telehealth: Payer: Self-pay | Admitting: Physical Medicine and Rehabilitation

## 2023-08-10 NOTE — Telephone Encounter (Signed)
Patient called. Would like an appointment with Dr. Alvester Morin. His call back number is [phone]

## 2023-08-12 ENCOUNTER — Telehealth: Payer: Self-pay | Admitting: Physical Medicine and Rehabilitation

## 2023-08-12 ENCOUNTER — Other Ambulatory Visit: Payer: Self-pay | Admitting: Physical Medicine and Rehabilitation

## 2023-08-12 DIAGNOSIS — M25552 Pain in left hip: Secondary | ICD-10-CM

## 2023-08-12 DIAGNOSIS — M25551 Pain in right hip: Secondary | ICD-10-CM

## 2023-08-12 NOTE — Telephone Encounter (Signed)
See previous encounter

## 2023-08-12 NOTE — Telephone Encounter (Signed)
Pt called requesting a call to set an appt injection in both hips. Pt states he is going out of town and need them soon. Please call pt at [phone].

## 2023-08-13 ENCOUNTER — Other Ambulatory Visit: Payer: Self-pay

## 2023-08-13 ENCOUNTER — Ambulatory Visit: Payer: Medicare PPO | Admitting: Physical Medicine and Rehabilitation

## 2023-08-13 DIAGNOSIS — G8929 Other chronic pain: Secondary | ICD-10-CM

## 2023-08-13 DIAGNOSIS — M25551 Pain in right hip: Secondary | ICD-10-CM | POA: Diagnosis not present

## 2023-08-13 DIAGNOSIS — M25552 Pain in left hip: Secondary | ICD-10-CM

## 2023-08-13 DIAGNOSIS — M545 Low back pain, unspecified: Secondary | ICD-10-CM

## 2023-08-13 DIAGNOSIS — M47816 Spondylosis without myelopathy or radiculopathy, lumbar region: Secondary | ICD-10-CM | POA: Diagnosis not present

## 2023-08-13 DIAGNOSIS — M48062 Spinal stenosis, lumbar region with neurogenic claudication: Secondary | ICD-10-CM

## 2023-08-13 NOTE — Progress Notes (Signed)
Functional Pain Scale - descriptive words and definitions  Distracting (5)    Aware of pain/able to complete some ADL's but limited by pain/sleep is affected and active distractions are only slightly useful. Moderate range order  Average Pain 6  Pain is only when he is walking or sitting for a long time  He is going out of town in a few weeks and hoping this injection will help.  +Driver, -BT, -Dye Allergies.

## 2023-08-13 NOTE — Progress Notes (Signed)
Jeff Watkins - 87 y.o. male MRN [id number]  Date of birth: February 01, 1936  Office Visit Note: Visit Date: 08/13/2023 PCP: Lorenda Ishihara, MD Referred by: Lorenda Ishihara,*  Subjective: Chief Complaint  Patient presents with   Right Hip - Pain   Left Hip - Pain   HPI: Jeff Watkins is a 87 y.o. male who comes in today for evaluation and management for chronic and recalcitrant and severe at times low back and bilateral hip pain.  The last time he was seen in the office was in September of last year.  We completed intra-articular hip injection fluoroscopic guidance which was done on several occasions.  He is originally seen by Dr. Doneen Poisson with issues of his hips and lower back.  He does have lumbar stenosis quite severe at L4-5.  We have never actually injected for his stenosis.  Since I have seen him he is also had a hip injection intra-articularly by ultrasound by Dr. Madelyn Brunner.  He reports he was doing quite well up until the last several months and he started to have worsening low back pain and pain into both hips.  The pain is more posterior now and does radiate into the thighs more than it used to.  He gets some referral to the groin but not much referral to the groin.  His symptoms are worse with sitting for a prolonged time and getting up or with standing and moving.  No paresthesias in the feet.  No focal weakness although he feels weak at times.  His case is complicated by history of stroke and coronary artery disease.  He is on chronic anticoagulation.  He has had no lumbar surgery.  No history of neuropathy.   I spent more than 30 minutes speaking face-to-face with the patient with 50% of the time in counseling and discussing coordination of care.        Review of Systems  Musculoskeletal:  Positive for back pain and joint pain.  Neurological:  Positive for weakness.  All other systems reviewed and are negative.  Otherwise per HPI.  Assessment &  Plan: Visit Diagnoses:    ICD-10-CM   1. Pain in right hip  M25.551 XR C-ARM NO REPORT    Large Joint Inj: bilateral hip joint    2. Pain in left hip  M25.552 XR C-ARM NO REPORT    Large Joint Inj: bilateral hip joint    3. Spinal stenosis of lumbar region with neurogenic claudication  M48.062     4. Chronic bilateral low back pain without sciatica  M54.50    G89.29     5. Spondylosis without myelopathy or radiculopathy, lumbar region  M47.816        Plan: Findings:  1.  Bilateral hip pain with known osteoarthritis of both hips that has responded quite nicely to very infrequent intra-articular hip injections by both fluoroscopy and ultrasound.  He reports his symptoms today seem to be consistent with his hip pain that he has had in the past.  He is not getting much in way of groin pain however and it is a little bit confusing at times with his symptoms.  Because he is going out of town coming up to have a high school reunion in [place] and is on long trip we decided to complete diagnostic and hopefully therapeutic injections into both hips today.  Depending on relief could always have him follow-up with Dr. Doneen Poisson.  2.  Low back and lumbar stenosis.  Severe on imaging but has not really ever had any injections or procedures performed with symptoms that seem to be more related to his hips.  I think this is starting to declare itself I feel like some of his symptoms today are neurogenic claudication.  Depending on relief with the hip injections could look at diagnostic and therapeutic injections of low lumbar spine.  He is prior not a great surgical candidate given his coronary artery history and age.    Meds & Orders: No orders of the defined types were placed in this encounter.   Orders Placed This Encounter  Procedures   Large Joint Inj: bilateral hip joint   XR C-ARM NO REPORT    Follow-up: No follow-ups on file.   Procedures: Large Joint Inj: bilateral hip joint on  08/13/2023 3:37 PM Indications: diagnostic evaluation and pain Details: 22 G 3.5 in needle, fluoroscopy-guided anterior approach  Arthrogram: No  Medications (Right): 40 mg triamcinolone acetonide 40 MG/ML; 5 mL bupivacaine 0.5 % Medications (Left): 40 mg triamcinolone acetonide 40 MG/ML; 5 mL bupivacaine 0.5 % Outcome: tolerated well, no immediate complications  There was excellent flow of contrast producing a partial arthrogram of the hip. The patient did have relief of symptoms during the anesthetic phase of the injection. Procedure, treatment alternatives, risks and benefits explained, specific risks discussed. Consent was given by the patient. Immediately prior to procedure a time out was called to verify the correct patient, procedure, equipment, support staff and site/side marked as required. Patient was prepped and draped in the usual sterile fashion.          Clinical History: CT LUMBAR SPINE FINDINGS   Segmentation: 5 lumbar type vertebrae.   Alignment: Normal.   Vertebrae: No acute fracture or focal pathologic process.   Paraspinal and other soft tissues: Negative.   Disc levels: Vertebral body heights and intervertebral disc heights  are preserved. Mild multifactorial canal stenosis at L2-3 and L3-4  and moderate multifactorial canal stenosis at L4-5. Mild bilateral  neuroforaminal narrowing, L4-5. Moderate bilateral facet arthrosis  L4-5.   IMPRESSION:  No acute fracture or listhesis of the thoracolumbar spine.    Electronically Signed    By: Helyn Numbers M.D.    On: 02/19/2022 23:34   He reports that he quit smoking about 62 years ago. His smoking use included cigarettes. He started smoking about 63 years ago. He has a 0.1 pack-year smoking history. He has never used smokeless tobacco. No results for input(s): "HGBA1C", "LABURIC" in the last 8760 hours.  Objective:  VS:  HT:    WT:   BMI:     BP:   HR: bpm  TEMP: ( )  RESP:  Physical Exam Vitals and  nursing note reviewed.  Constitutional:      General: He is not in acute distress.    Appearance: Normal appearance. He is well-developed. He is not ill-appearing.  HENT:     Head: Normocephalic and atraumatic.     Right Ear: External ear normal.     Left Ear: External ear normal.     Nose: No congestion.  Eyes:     Extraocular Movements: Extraocular movements intact.     Conjunctiva/sclera: Conjunctivae normal.     Pupils: Pupils are equal, round, and reactive to light.  Cardiovascular:     Rate and Rhythm: Normal rate.     Pulses: Normal pulses.     Heart sounds: Normal heart sounds.  Pulmonary:     Effort: Pulmonary effort  is normal. No respiratory distress.  Abdominal:     General: There is no distension.     Palpations: Abdomen is soft.  Musculoskeletal:        General: No tenderness or signs of injury.     Cervical back: Normal range of motion and neck supple. No rigidity.     Right lower leg: No edema.     Left lower leg: No edema.     Comments: Patient has good distal strength without clonus.  Some pain over the greater trochanters and stiffness with internal rotation of both hips.  Does have pain going from sit to stand.  Does walk with a forward flexed spine.  Does use walker.  Skin:    General: Skin is warm and dry.     Findings: No erythema or rash.  Neurological:     General: No focal deficit present.     Mental Status: He is alert and oriented to person, place, and time.     Cranial Nerves: No cranial nerve deficit.     Sensory: No sensory deficit.     Motor: No weakness or abnormal muscle tone.     Coordination: Coordination normal.     Gait: Gait abnormal.  Psychiatric:        Mood and Affect: Mood normal.        Behavior: Behavior normal.     Ortho Exam  Imaging: No results found.  Past Medical/Family/Surgical/Social History: Medications & Allergies reviewed per EMR, new medications updated. 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   Bilateral carpal tunnel syndrome 06/10/2017   Essential hypertension 02/25/2016   Hyperlipidemia 02/25/2016   Shortness of breath 02/25/2016   Appendicitis 10/17/2013   Past Medical History:  Diagnosis Date   Arthritis    Asthma    Bradycardia 10/14/2021   Bronchitis    CAD in native artery 04/18/2021   Carpal tunnel syndrome, bilateral    Chronic diastolic heart failure (HCC) 03/07/2020   Coronary artery disease    50-70% LAD by 2001 cath   Essential hypertension 02/25/2016   GERD (gastroesophageal reflux disease)    Hypercholesteremia    Hyperlipidemia 02/25/2016   Hypertension    Obesity (BMI 30-39.9) 02/25/2016   OSA (obstructive sleep apnea) 04/18/2021   Pre-diabetes    Shortness of breath 02/25/2016   Sleep apnea    does not use CPAP   Stroke (HCC) ~2010   mini stroke    Stroke [facility]) 04/18/2021   Family History  Problem Relation Age of Onset   Heart disease Father    Stroke Father    Cancer Brother        colon   Other Son        Paraplegic from motorcycle accident   HIV/AIDS Daughter 25   Past Surgical History:  Procedure Laterality Date   CARDIAC CATHETERIZATION  2001   CARPAL TUNNEL RELEASE Right 07/06/2018   Procedure: RIGHT CARPAL TUNNEL RELEASE, EXTENDED EXCISION;  Surgeon: Cindee Salt, MD;  Location: [facility];  Service: Orthopedics;  Laterality: Right;   CARPAL TUNNEL RELEASE Left 04/12/2019   Procedure: CARPAL TUNNEL RELEASE;  Surgeon: Cindee Salt, MD;  Location: [facility];  Service: Orthopedics;  Laterality: Left;   COLONOSCOPY WITH PROPOFOL  N/A 05/21/2015   Procedure: COLONOSCOPY WITH PROPOFOL;  Surgeon: [name], MD;  Location: [facility];  Service: Endoscopy;  Laterality: N/A;   EYE SURGERY      bilateral cataracts   FRACTURE SURGERY  1966   compound arm   HERNIA REPAIR     LAPAROSCOPIC APPENDECTOMY N/A 10/18/2013   Procedure: APPENDECTOMY LAPAROSCOPIC;  Surgeon: [name], MD;  Location: [facility] OR;  Service: General;  Laterality: N/A;   SUBMANDIBULAR GLAND EXCISION Left 09/02/2017    Left submandibular gland and associated soft tissue and lymph nodes   SUBMANDIBULAR GLAND EXCISION Left 09/02/2017   Procedure: EXCISION LEFT SUBMANDIBULAR GLAND;  Surgeon: Serena Colonel, MD;  Location: [facility] OR;  Service: ENT;  Laterality: Left;   TESTICLE REMOVAL  1965   Social History   Occupational History   Occupation: retired  Tobacco Use   Smoking status: Former    Current packs/day: 0.00    Average packs/day: 0.3 packs/day for 0.5 years (0.1 ttl pk-yrs)    Types: Cigarettes    Start date: 04/28/1960    Quit date: 10/27/1960    Years since quitting: 62.8   Smokeless tobacco: Never  Vaping Use   Vaping status: Never Used  Substance and Sexual Activity   Alcohol use: No   Drug use: No   Sexual activity: Not on file

## 2023-08-21 DIAGNOSIS — R2689 Other abnormalities of gait and mobility: Secondary | ICD-10-CM | POA: Diagnosis not present

## 2023-08-21 DIAGNOSIS — M6281 Muscle weakness (generalized): Secondary | ICD-10-CM | POA: Diagnosis not present

## 2023-08-21 DIAGNOSIS — M6250 Muscle wasting and atrophy, not elsewhere classified, unspecified site: Secondary | ICD-10-CM | POA: Diagnosis not present

## 2023-08-21 DIAGNOSIS — S22070D Wedge compression fracture of T9-T10 vertebra, subsequent encounter for fracture with routine healing: Secondary | ICD-10-CM | POA: Diagnosis not present

## 2023-08-27 ENCOUNTER — Encounter: Payer: Self-pay | Admitting: Physical Medicine and Rehabilitation

## 2023-08-27 MED ORDER — BUPIVACAINE HCL 0.5 % IJ SOLN
5.0000 mL | INTRAMUSCULAR | Status: AC | PRN
  Administered 2023-08-13: 5 mL via INTRA_ARTICULAR

## 2023-08-27 MED ORDER — TRIAMCINOLONE ACETONIDE 40 MG/ML IJ SUSP
40.0000 mg | INTRAMUSCULAR | Status: AC | PRN
  Administered 2023-08-13: 40 mg via INTRA_ARTICULAR

## 2023-09-15 ENCOUNTER — Other Ambulatory Visit (HOSPITAL_BASED_OUTPATIENT_CLINIC_OR_DEPARTMENT_OTHER): Payer: Self-pay | Admitting: Cardiovascular Disease

## 2023-09-21 DIAGNOSIS — M6281 Muscle weakness (generalized): Secondary | ICD-10-CM | POA: Diagnosis not present

## 2023-09-21 DIAGNOSIS — R2689 Other abnormalities of gait and mobility: Secondary | ICD-10-CM | POA: Diagnosis not present

## 2023-09-21 DIAGNOSIS — S22070D Wedge compression fracture of T9-T10 vertebra, subsequent encounter for fracture with routine healing: Secondary | ICD-10-CM | POA: Diagnosis not present

## 2023-09-21 DIAGNOSIS — M6250 Muscle wasting and atrophy, not elsewhere classified, unspecified site: Secondary | ICD-10-CM | POA: Diagnosis not present

## 2023-09-28 DIAGNOSIS — J209 Acute bronchitis, unspecified: Secondary | ICD-10-CM | POA: Diagnosis not present

## 2023-09-28 DIAGNOSIS — I10 Essential (primary) hypertension: Secondary | ICD-10-CM | POA: Diagnosis not present

## 2023-10-21 DIAGNOSIS — M6281 Muscle weakness (generalized): Secondary | ICD-10-CM | POA: Diagnosis not present

## 2023-10-21 DIAGNOSIS — M6250 Muscle wasting and atrophy, not elsewhere classified, unspecified site: Secondary | ICD-10-CM | POA: Diagnosis not present

## 2023-10-21 DIAGNOSIS — S22070D Wedge compression fracture of T9-T10 vertebra, subsequent encounter for fracture with routine healing: Secondary | ICD-10-CM | POA: Diagnosis not present

## 2023-10-21 DIAGNOSIS — R2689 Other abnormalities of gait and mobility: Secondary | ICD-10-CM | POA: Diagnosis not present

## 2023-11-11 ENCOUNTER — Other Ambulatory Visit (HOSPITAL_BASED_OUTPATIENT_CLINIC_OR_DEPARTMENT_OTHER): Payer: Self-pay | Admitting: Cardiovascular Disease

## 2023-11-21 DIAGNOSIS — M6281 Muscle weakness (generalized): Secondary | ICD-10-CM | POA: Diagnosis not present

## 2023-11-21 DIAGNOSIS — S22070D Wedge compression fracture of T9-T10 vertebra, subsequent encounter for fracture with routine healing: Secondary | ICD-10-CM | POA: Diagnosis not present

## 2023-11-21 DIAGNOSIS — R2689 Other abnormalities of gait and mobility: Secondary | ICD-10-CM | POA: Diagnosis not present

## 2023-11-21 DIAGNOSIS — M6250 Muscle wasting and atrophy, not elsewhere classified, unspecified site: Secondary | ICD-10-CM | POA: Diagnosis not present

## 2023-12-10 DIAGNOSIS — E119 Type 2 diabetes mellitus without complications: Secondary | ICD-10-CM | POA: Diagnosis not present

## 2023-12-10 DIAGNOSIS — H31092 Other chorioretinal scars, left eye: Secondary | ICD-10-CM | POA: Diagnosis not present

## 2023-12-10 DIAGNOSIS — H43812 Vitreous degeneration, left eye: Secondary | ICD-10-CM | POA: Diagnosis not present

## 2023-12-10 DIAGNOSIS — H04123 Dry eye syndrome of bilateral lacrimal glands: Secondary | ICD-10-CM | POA: Diagnosis not present

## 2023-12-10 DIAGNOSIS — H04413 Chronic dacryocystitis of bilateral lacrimal passages: Secondary | ICD-10-CM | POA: Diagnosis not present

## 2023-12-10 DIAGNOSIS — H40013 Open angle with borderline findings, low risk, bilateral: Secondary | ICD-10-CM | POA: Diagnosis not present

## 2023-12-10 DIAGNOSIS — H43811 Vitreous degeneration, right eye: Secondary | ICD-10-CM | POA: Diagnosis not present

## 2023-12-10 DIAGNOSIS — H1045 Other chronic allergic conjunctivitis: Secondary | ICD-10-CM | POA: Diagnosis not present

## 2023-12-10 LAB — HM DIABETES EYE EXAM

## 2023-12-14 ENCOUNTER — Encounter: Payer: Self-pay | Admitting: Internal Medicine

## 2023-12-23 ENCOUNTER — Other Ambulatory Visit (HOSPITAL_BASED_OUTPATIENT_CLINIC_OR_DEPARTMENT_OTHER): Payer: Self-pay | Admitting: Cardiovascular Disease

## 2023-12-28 DIAGNOSIS — Z800 Family history of malignant neoplasm of digestive organs: Secondary | ICD-10-CM | POA: Diagnosis not present

## 2023-12-28 DIAGNOSIS — M4722 Other spondylosis with radiculopathy, cervical region: Secondary | ICD-10-CM | POA: Diagnosis not present

## 2023-12-28 DIAGNOSIS — I10 Essential (primary) hypertension: Secondary | ICD-10-CM | POA: Diagnosis not present

## 2023-12-28 DIAGNOSIS — I110 Hypertensive heart disease with heart failure: Secondary | ICD-10-CM | POA: Diagnosis not present

## 2023-12-28 DIAGNOSIS — I2510 Atherosclerotic heart disease of native coronary artery without angina pectoris: Secondary | ICD-10-CM | POA: Diagnosis not present

## 2023-12-28 DIAGNOSIS — R54 Age-related physical debility: Secondary | ICD-10-CM | POA: Diagnosis not present

## 2024-01-13 DIAGNOSIS — U071 COVID-19: Secondary | ICD-10-CM | POA: Diagnosis not present

## 2024-01-19 DIAGNOSIS — J4489 Other specified chronic obstructive pulmonary disease: Secondary | ICD-10-CM | POA: Diagnosis not present

## 2024-01-19 DIAGNOSIS — I25119 Atherosclerotic heart disease of native coronary artery with unspecified angina pectoris: Secondary | ICD-10-CM | POA: Diagnosis not present

## 2024-01-19 DIAGNOSIS — E1122 Type 2 diabetes mellitus with diabetic chronic kidney disease: Secondary | ICD-10-CM | POA: Diagnosis not present

## 2024-01-19 DIAGNOSIS — M40209 Unspecified kyphosis, site unspecified: Secondary | ICD-10-CM | POA: Diagnosis not present

## 2024-01-19 DIAGNOSIS — Z8673 Personal history of transient ischemic attack (TIA), and cerebral infarction without residual deficits: Secondary | ICD-10-CM | POA: Diagnosis not present

## 2024-01-19 DIAGNOSIS — N1831 Chronic kidney disease, stage 3a: Secondary | ICD-10-CM | POA: Diagnosis not present

## 2024-01-19 DIAGNOSIS — Z79890 Hormone replacement therapy (postmenopausal): Secondary | ICD-10-CM | POA: Diagnosis not present

## 2024-01-19 DIAGNOSIS — N3941 Urge incontinence: Secondary | ICD-10-CM | POA: Diagnosis not present

## 2024-01-19 DIAGNOSIS — R001 Bradycardia, unspecified: Secondary | ICD-10-CM | POA: Diagnosis not present

## 2024-01-19 DIAGNOSIS — N400 Enlarged prostate without lower urinary tract symptoms: Secondary | ICD-10-CM | POA: Diagnosis not present

## 2024-01-19 DIAGNOSIS — D6869 Other thrombophilia: Secondary | ICD-10-CM | POA: Diagnosis not present

## 2024-01-19 DIAGNOSIS — Z880 Allergy status to penicillin: Secondary | ICD-10-CM | POA: Diagnosis not present

## 2024-01-19 DIAGNOSIS — I4891 Unspecified atrial fibrillation: Secondary | ICD-10-CM | POA: Diagnosis not present

## 2024-01-19 DIAGNOSIS — Z7902 Long term (current) use of antithrombotics/antiplatelets: Secondary | ICD-10-CM | POA: Diagnosis not present

## 2024-01-19 DIAGNOSIS — I509 Heart failure, unspecified: Secondary | ICD-10-CM | POA: Diagnosis not present

## 2024-01-19 DIAGNOSIS — E291 Testicular hypofunction: Secondary | ICD-10-CM | POA: Diagnosis not present

## 2024-01-19 DIAGNOSIS — I130 Hypertensive heart and chronic kidney disease with heart failure and stage 1 through stage 4 chronic kidney disease, or unspecified chronic kidney disease: Secondary | ICD-10-CM | POA: Diagnosis not present

## 2024-02-11 DIAGNOSIS — E1122 Type 2 diabetes mellitus with diabetic chronic kidney disease: Secondary | ICD-10-CM | POA: Diagnosis not present

## 2024-02-11 DIAGNOSIS — S22020A Wedge compression fracture of second thoracic vertebra, initial encounter for closed fracture: Secondary | ICD-10-CM | POA: Diagnosis not present

## 2024-02-11 DIAGNOSIS — I130 Hypertensive heart and chronic kidney disease with heart failure and stage 1 through stage 4 chronic kidney disease, or unspecified chronic kidney disease: Secondary | ICD-10-CM | POA: Diagnosis not present

## 2024-02-11 DIAGNOSIS — N400 Enlarged prostate without lower urinary tract symptoms: Secondary | ICD-10-CM | POA: Diagnosis not present

## 2024-02-11 DIAGNOSIS — I2510 Atherosclerotic heart disease of native coronary artery without angina pectoris: Secondary | ICD-10-CM | POA: Diagnosis not present

## 2024-02-11 DIAGNOSIS — S22070A Wedge compression fracture of T9-T10 vertebra, initial encounter for closed fracture: Secondary | ICD-10-CM | POA: Diagnosis not present

## 2024-02-11 DIAGNOSIS — N179 Acute kidney failure, unspecified: Secondary | ICD-10-CM | POA: Diagnosis not present

## 2024-02-11 DIAGNOSIS — K5730 Diverticulosis of large intestine without perforation or abscess without bleeding: Secondary | ICD-10-CM | POA: Diagnosis not present

## 2024-02-11 DIAGNOSIS — S22029A Unspecified fracture of second thoracic vertebra, initial encounter for closed fracture: Secondary | ICD-10-CM | POA: Diagnosis not present

## 2024-02-11 DIAGNOSIS — I509 Heart failure, unspecified: Secondary | ICD-10-CM | POA: Diagnosis not present

## 2024-02-11 DIAGNOSIS — N189 Chronic kidney disease, unspecified: Secondary | ICD-10-CM | POA: Diagnosis not present

## 2024-02-11 DIAGNOSIS — I700 Atherosclerosis of aorta: Secondary | ICD-10-CM | POA: Diagnosis not present

## 2024-02-11 DIAGNOSIS — S22079A Unspecified fracture of T9-T10 vertebra, initial encounter for closed fracture: Secondary | ICD-10-CM | POA: Diagnosis not present

## 2024-02-11 DIAGNOSIS — M549 Dorsalgia, unspecified: Secondary | ICD-10-CM | POA: Diagnosis not present

## 2024-02-12 DIAGNOSIS — I517 Cardiomegaly: Secondary | ICD-10-CM | POA: Diagnosis not present

## 2024-02-12 DIAGNOSIS — R001 Bradycardia, unspecified: Secondary | ICD-10-CM | POA: Diagnosis not present

## 2024-02-12 DIAGNOSIS — T148XXA Other injury of unspecified body region, initial encounter: Secondary | ICD-10-CM | POA: Diagnosis not present

## 2024-02-12 DIAGNOSIS — I700 Atherosclerosis of aorta: Secondary | ICD-10-CM | POA: Diagnosis not present

## 2024-02-13 DIAGNOSIS — S22070A Wedge compression fracture of T9-T10 vertebra, initial encounter for closed fracture: Secondary | ICD-10-CM | POA: Diagnosis not present

## 2024-02-13 DIAGNOSIS — N400 Enlarged prostate without lower urinary tract symptoms: Secondary | ICD-10-CM | POA: Diagnosis not present

## 2024-02-13 DIAGNOSIS — S22020A Wedge compression fracture of second thoracic vertebra, initial encounter for closed fracture: Secondary | ICD-10-CM | POA: Diagnosis not present

## 2024-02-13 DIAGNOSIS — N179 Acute kidney failure, unspecified: Secondary | ICD-10-CM | POA: Diagnosis not present

## 2024-02-13 DIAGNOSIS — I2510 Atherosclerotic heart disease of native coronary artery without angina pectoris: Secondary | ICD-10-CM | POA: Diagnosis not present

## 2024-02-13 DIAGNOSIS — I5030 Unspecified diastolic (congestive) heart failure: Secondary | ICD-10-CM | POA: Diagnosis not present

## 2024-02-13 DIAGNOSIS — W19XXXA Unspecified fall, initial encounter: Secondary | ICD-10-CM | POA: Diagnosis not present

## 2024-02-13 DIAGNOSIS — N189 Chronic kidney disease, unspecified: Secondary | ICD-10-CM | POA: Diagnosis not present

## 2024-02-14 DIAGNOSIS — I5030 Unspecified diastolic (congestive) heart failure: Secondary | ICD-10-CM | POA: Diagnosis not present

## 2024-02-14 DIAGNOSIS — W19XXXA Unspecified fall, initial encounter: Secondary | ICD-10-CM | POA: Diagnosis not present

## 2024-02-14 DIAGNOSIS — S22070A Wedge compression fracture of T9-T10 vertebra, initial encounter for closed fracture: Secondary | ICD-10-CM | POA: Diagnosis not present

## 2024-02-14 DIAGNOSIS — N189 Chronic kidney disease, unspecified: Secondary | ICD-10-CM | POA: Diagnosis not present

## 2024-02-14 DIAGNOSIS — S22020A Wedge compression fracture of second thoracic vertebra, initial encounter for closed fracture: Secondary | ICD-10-CM | POA: Diagnosis not present

## 2024-02-14 DIAGNOSIS — N179 Acute kidney failure, unspecified: Secondary | ICD-10-CM | POA: Diagnosis not present

## 2024-02-15 DIAGNOSIS — W19XXXA Unspecified fall, initial encounter: Secondary | ICD-10-CM | POA: Diagnosis not present

## 2024-02-15 DIAGNOSIS — I5032 Chronic diastolic (congestive) heart failure: Secondary | ICD-10-CM | POA: Diagnosis not present

## 2024-02-15 DIAGNOSIS — N400 Enlarged prostate without lower urinary tract symptoms: Secondary | ICD-10-CM | POA: Diagnosis not present

## 2024-02-15 DIAGNOSIS — S22070A Wedge compression fracture of T9-T10 vertebra, initial encounter for closed fracture: Secondary | ICD-10-CM | POA: Diagnosis not present

## 2024-02-15 DIAGNOSIS — Z743 Need for continuous supervision: Secondary | ICD-10-CM | POA: Diagnosis not present

## 2024-02-15 DIAGNOSIS — I700 Atherosclerosis of aorta: Secondary | ICD-10-CM | POA: Diagnosis not present

## 2024-02-15 DIAGNOSIS — Z7409 Other reduced mobility: Secondary | ICD-10-CM | POA: Diagnosis not present

## 2024-02-15 DIAGNOSIS — S22020D Wedge compression fracture of second thoracic vertebra, subsequent encounter for fracture with routine healing: Secondary | ICD-10-CM | POA: Diagnosis not present

## 2024-02-15 DIAGNOSIS — E1122 Type 2 diabetes mellitus with diabetic chronic kidney disease: Secondary | ICD-10-CM | POA: Diagnosis not present

## 2024-02-15 DIAGNOSIS — S22020A Wedge compression fracture of second thoracic vertebra, initial encounter for closed fracture: Secondary | ICD-10-CM | POA: Diagnosis not present

## 2024-02-15 DIAGNOSIS — M4850XD Collapsed vertebra, not elsewhere classified, site unspecified, subsequent encounter for fracture with routine healing: Secondary | ICD-10-CM | POA: Diagnosis not present

## 2024-02-15 DIAGNOSIS — N179 Acute kidney failure, unspecified: Secondary | ICD-10-CM | POA: Diagnosis not present

## 2024-02-15 DIAGNOSIS — M6281 Muscle weakness (generalized): Secondary | ICD-10-CM | POA: Diagnosis not present

## 2024-02-15 DIAGNOSIS — M546 Pain in thoracic spine: Secondary | ICD-10-CM | POA: Diagnosis not present

## 2024-02-15 DIAGNOSIS — I130 Hypertensive heart and chronic kidney disease with heart failure and stage 1 through stage 4 chronic kidney disease, or unspecified chronic kidney disease: Secondary | ICD-10-CM | POA: Diagnosis not present

## 2024-02-15 DIAGNOSIS — I10 Essential (primary) hypertension: Secondary | ICD-10-CM | POA: Diagnosis not present

## 2024-02-15 DIAGNOSIS — S22079A Unspecified fracture of T9-T10 vertebra, initial encounter for closed fracture: Secondary | ICD-10-CM | POA: Diagnosis not present

## 2024-02-15 DIAGNOSIS — I2510 Atherosclerotic heart disease of native coronary artery without angina pectoris: Secondary | ICD-10-CM | POA: Diagnosis not present

## 2024-02-15 DIAGNOSIS — R001 Bradycardia, unspecified: Secondary | ICD-10-CM | POA: Diagnosis not present

## 2024-02-15 DIAGNOSIS — R0602 Shortness of breath: Secondary | ICD-10-CM | POA: Diagnosis not present

## 2024-02-15 DIAGNOSIS — K219 Gastro-esophageal reflux disease without esophagitis: Secondary | ICD-10-CM | POA: Diagnosis not present

## 2024-02-15 DIAGNOSIS — I509 Heart failure, unspecified: Secondary | ICD-10-CM | POA: Diagnosis not present

## 2024-02-15 DIAGNOSIS — E875 Hyperkalemia: Secondary | ICD-10-CM | POA: Diagnosis not present

## 2024-02-15 DIAGNOSIS — N189 Chronic kidney disease, unspecified: Secondary | ICD-10-CM | POA: Diagnosis not present

## 2024-02-15 DIAGNOSIS — I5030 Unspecified diastolic (congestive) heart failure: Secondary | ICD-10-CM | POA: Diagnosis not present

## 2024-02-15 DIAGNOSIS — R296 Repeated falls: Secondary | ICD-10-CM | POA: Diagnosis not present

## 2024-02-15 DIAGNOSIS — R531 Weakness: Secondary | ICD-10-CM | POA: Diagnosis not present

## 2024-02-15 DIAGNOSIS — S22029A Unspecified fracture of second thoracic vertebra, initial encounter for closed fracture: Secondary | ICD-10-CM | POA: Diagnosis not present

## 2024-02-15 DIAGNOSIS — R2689 Other abnormalities of gait and mobility: Secondary | ICD-10-CM | POA: Diagnosis not present

## 2024-02-15 DIAGNOSIS — S22070D Wedge compression fracture of T9-T10 vertebra, subsequent encounter for fracture with routine healing: Secondary | ICD-10-CM | POA: Diagnosis not present

## 2024-02-16 DIAGNOSIS — R531 Weakness: Secondary | ICD-10-CM | POA: Diagnosis not present

## 2024-02-16 DIAGNOSIS — Z7409 Other reduced mobility: Secondary | ICD-10-CM | POA: Diagnosis not present

## 2024-02-16 DIAGNOSIS — S22070D Wedge compression fracture of T9-T10 vertebra, subsequent encounter for fracture with routine healing: Secondary | ICD-10-CM | POA: Diagnosis not present

## 2024-02-16 DIAGNOSIS — I10 Essential (primary) hypertension: Secondary | ICD-10-CM | POA: Diagnosis not present

## 2024-02-16 DIAGNOSIS — N189 Chronic kidney disease, unspecified: Secondary | ICD-10-CM | POA: Diagnosis not present

## 2024-02-16 DIAGNOSIS — I5032 Chronic diastolic (congestive) heart failure: Secondary | ICD-10-CM | POA: Diagnosis not present

## 2024-02-16 DIAGNOSIS — K219 Gastro-esophageal reflux disease without esophagitis: Secondary | ICD-10-CM | POA: Diagnosis not present

## 2024-02-16 DIAGNOSIS — N179 Acute kidney failure, unspecified: Secondary | ICD-10-CM | POA: Diagnosis not present

## 2024-02-16 DIAGNOSIS — M4850XD Collapsed vertebra, not elsewhere classified, site unspecified, subsequent encounter for fracture with routine healing: Secondary | ICD-10-CM | POA: Diagnosis not present

## 2024-02-17 DIAGNOSIS — R0602 Shortness of breath: Secondary | ICD-10-CM | POA: Diagnosis not present

## 2024-02-17 DIAGNOSIS — Z7409 Other reduced mobility: Secondary | ICD-10-CM | POA: Diagnosis not present

## 2024-02-17 DIAGNOSIS — R079 Chest pain, unspecified: Secondary | ICD-10-CM | POA: Diagnosis not present

## 2024-02-17 DIAGNOSIS — I130 Hypertensive heart and chronic kidney disease with heart failure and stage 1 through stage 4 chronic kidney disease, or unspecified chronic kidney disease: Secondary | ICD-10-CM | POA: Diagnosis not present

## 2024-02-17 DIAGNOSIS — E785 Hyperlipidemia, unspecified: Secondary | ICD-10-CM | POA: Diagnosis not present

## 2024-02-17 DIAGNOSIS — E119 Type 2 diabetes mellitus without complications: Secondary | ICD-10-CM | POA: Diagnosis not present

## 2024-02-17 DIAGNOSIS — M25512 Pain in left shoulder: Secondary | ICD-10-CM | POA: Diagnosis not present

## 2024-02-17 DIAGNOSIS — N179 Acute kidney failure, unspecified: Secondary | ICD-10-CM | POA: Diagnosis not present

## 2024-02-17 DIAGNOSIS — R110 Nausea: Secondary | ICD-10-CM | POA: Diagnosis not present

## 2024-02-17 DIAGNOSIS — I517 Cardiomegaly: Secondary | ICD-10-CM | POA: Diagnosis not present

## 2024-02-17 DIAGNOSIS — I959 Hypotension, unspecified: Secondary | ICD-10-CM | POA: Diagnosis not present

## 2024-02-17 DIAGNOSIS — R296 Repeated falls: Secondary | ICD-10-CM | POA: Diagnosis not present

## 2024-02-17 DIAGNOSIS — R2689 Other abnormalities of gait and mobility: Secondary | ICD-10-CM | POA: Diagnosis not present

## 2024-02-17 DIAGNOSIS — S22020D Wedge compression fracture of second thoracic vertebra, subsequent encounter for fracture with routine healing: Secondary | ICD-10-CM | POA: Diagnosis not present

## 2024-02-17 DIAGNOSIS — W19XXXA Unspecified fall, initial encounter: Secondary | ICD-10-CM | POA: Diagnosis not present

## 2024-02-17 DIAGNOSIS — E222 Syndrome of inappropriate secretion of antidiuretic hormone: Secondary | ICD-10-CM | POA: Diagnosis not present

## 2024-02-17 DIAGNOSIS — R531 Weakness: Secondary | ICD-10-CM | POA: Diagnosis not present

## 2024-02-17 DIAGNOSIS — M25519 Pain in unspecified shoulder: Secondary | ICD-10-CM | POA: Diagnosis not present

## 2024-02-17 DIAGNOSIS — Z20822 Contact with and (suspected) exposure to covid-19: Secondary | ICD-10-CM | POA: Diagnosis not present

## 2024-02-17 DIAGNOSIS — N400 Enlarged prostate without lower urinary tract symptoms: Secondary | ICD-10-CM | POA: Diagnosis not present

## 2024-02-17 DIAGNOSIS — I110 Hypertensive heart disease with heart failure: Secondary | ICD-10-CM | POA: Diagnosis not present

## 2024-02-17 DIAGNOSIS — I2510 Atherosclerotic heart disease of native coronary artery without angina pectoris: Secondary | ICD-10-CM | POA: Diagnosis not present

## 2024-02-17 DIAGNOSIS — I5032 Chronic diastolic (congestive) heart failure: Secondary | ICD-10-CM | POA: Diagnosis not present

## 2024-02-17 DIAGNOSIS — E1122 Type 2 diabetes mellitus with diabetic chronic kidney disease: Secondary | ICD-10-CM | POA: Diagnosis not present

## 2024-02-17 DIAGNOSIS — E871 Hypo-osmolality and hyponatremia: Secondary | ICD-10-CM | POA: Diagnosis not present

## 2024-02-17 DIAGNOSIS — I10 Essential (primary) hypertension: Secondary | ICD-10-CM | POA: Diagnosis not present

## 2024-02-17 DIAGNOSIS — E669 Obesity, unspecified: Secondary | ICD-10-CM | POA: Diagnosis not present

## 2024-02-17 DIAGNOSIS — M19012 Primary osteoarthritis, left shoulder: Secondary | ICD-10-CM | POA: Diagnosis not present

## 2024-02-17 DIAGNOSIS — S22070D Wedge compression fracture of T9-T10 vertebra, subsequent encounter for fracture with routine healing: Secondary | ICD-10-CM | POA: Diagnosis not present

## 2024-02-17 DIAGNOSIS — N189 Chronic kidney disease, unspecified: Secondary | ICD-10-CM | POA: Diagnosis not present

## 2024-02-17 DIAGNOSIS — R001 Bradycardia, unspecified: Secondary | ICD-10-CM | POA: Diagnosis not present

## 2024-02-17 DIAGNOSIS — Z9181 History of falling: Secondary | ICD-10-CM | POA: Diagnosis not present

## 2024-02-17 DIAGNOSIS — N183 Chronic kidney disease, stage 3 unspecified: Secondary | ICD-10-CM | POA: Diagnosis not present

## 2024-02-17 DIAGNOSIS — E875 Hyperkalemia: Secondary | ICD-10-CM | POA: Diagnosis not present

## 2024-02-17 DIAGNOSIS — M546 Pain in thoracic spine: Secondary | ICD-10-CM | POA: Diagnosis not present

## 2024-02-23 DIAGNOSIS — E119 Type 2 diabetes mellitus without complications: Secondary | ICD-10-CM | POA: Diagnosis not present

## 2024-02-23 DIAGNOSIS — M546 Pain in thoracic spine: Secondary | ICD-10-CM | POA: Diagnosis not present

## 2024-02-23 DIAGNOSIS — R110 Nausea: Secondary | ICD-10-CM | POA: Diagnosis not present

## 2024-02-23 DIAGNOSIS — S22020D Wedge compression fracture of second thoracic vertebra, subsequent encounter for fracture with routine healing: Secondary | ICD-10-CM | POA: Diagnosis not present

## 2024-02-23 DIAGNOSIS — I5032 Chronic diastolic (congestive) heart failure: Secondary | ICD-10-CM | POA: Diagnosis not present

## 2024-02-23 DIAGNOSIS — I110 Hypertensive heart disease with heart failure: Secondary | ICD-10-CM | POA: Diagnosis not present

## 2024-02-24 DIAGNOSIS — I959 Hypotension, unspecified: Secondary | ICD-10-CM | POA: Diagnosis not present

## 2024-02-24 DIAGNOSIS — I152 Hypertension secondary to endocrine disorders: Secondary | ICD-10-CM | POA: Diagnosis not present

## 2024-02-24 DIAGNOSIS — S22070D Wedge compression fracture of T9-T10 vertebra, subsequent encounter for fracture with routine healing: Secondary | ICD-10-CM | POA: Diagnosis not present

## 2024-02-24 DIAGNOSIS — E871 Hypo-osmolality and hyponatremia: Secondary | ICD-10-CM | POA: Diagnosis not present

## 2024-02-24 DIAGNOSIS — E1122 Type 2 diabetes mellitus with diabetic chronic kidney disease: Secondary | ICD-10-CM | POA: Diagnosis not present

## 2024-02-24 DIAGNOSIS — R079 Chest pain, unspecified: Secondary | ICD-10-CM | POA: Diagnosis not present

## 2024-02-24 DIAGNOSIS — E119 Type 2 diabetes mellitus without complications: Secondary | ICD-10-CM | POA: Diagnosis not present

## 2024-02-24 DIAGNOSIS — Z20822 Contact with and (suspected) exposure to covid-19: Secondary | ICD-10-CM | POA: Diagnosis not present

## 2024-02-24 DIAGNOSIS — I10 Essential (primary) hypertension: Secondary | ICD-10-CM | POA: Diagnosis not present

## 2024-02-24 DIAGNOSIS — W19XXXA Unspecified fall, initial encounter: Secondary | ICD-10-CM | POA: Diagnosis not present

## 2024-02-24 DIAGNOSIS — Z794 Long term (current) use of insulin: Secondary | ICD-10-CM | POA: Diagnosis not present

## 2024-02-24 DIAGNOSIS — I5030 Unspecified diastolic (congestive) heart failure: Secondary | ICD-10-CM | POA: Diagnosis not present

## 2024-02-24 DIAGNOSIS — K5900 Constipation, unspecified: Secondary | ICD-10-CM | POA: Diagnosis not present

## 2024-02-24 DIAGNOSIS — M25531 Pain in right wrist: Secondary | ICD-10-CM | POA: Diagnosis not present

## 2024-02-24 DIAGNOSIS — I361 Nonrheumatic tricuspid (valve) insufficiency: Secondary | ICD-10-CM | POA: Diagnosis not present

## 2024-02-24 DIAGNOSIS — N183 Chronic kidney disease, stage 3 unspecified: Secondary | ICD-10-CM | POA: Diagnosis not present

## 2024-02-24 DIAGNOSIS — R0602 Shortness of breath: Secondary | ICD-10-CM | POA: Diagnosis not present

## 2024-02-24 DIAGNOSIS — I110 Hypertensive heart disease with heart failure: Secondary | ICD-10-CM | POA: Diagnosis not present

## 2024-02-24 DIAGNOSIS — Z9181 History of falling: Secondary | ICD-10-CM | POA: Diagnosis not present

## 2024-02-24 DIAGNOSIS — S46812A Strain of other muscles, fascia and tendons at shoulder and upper arm level, left arm, initial encounter: Secondary | ICD-10-CM | POA: Diagnosis not present

## 2024-02-24 DIAGNOSIS — R296 Repeated falls: Secondary | ICD-10-CM | POA: Diagnosis not present

## 2024-02-24 DIAGNOSIS — Z789 Other specified health status: Secondary | ICD-10-CM | POA: Diagnosis not present

## 2024-02-24 DIAGNOSIS — E875 Hyperkalemia: Secondary | ICD-10-CM | POA: Diagnosis not present

## 2024-02-24 DIAGNOSIS — Z743 Need for continuous supervision: Secondary | ICD-10-CM | POA: Diagnosis not present

## 2024-02-24 DIAGNOSIS — I5032 Chronic diastolic (congestive) heart failure: Secondary | ICD-10-CM | POA: Diagnosis not present

## 2024-02-24 DIAGNOSIS — M546 Pain in thoracic spine: Secondary | ICD-10-CM | POA: Diagnosis not present

## 2024-02-24 DIAGNOSIS — M25512 Pain in left shoulder: Secondary | ICD-10-CM | POA: Diagnosis not present

## 2024-02-24 DIAGNOSIS — E669 Obesity, unspecified: Secondary | ICD-10-CM | POA: Diagnosis not present

## 2024-02-24 DIAGNOSIS — I2510 Atherosclerotic heart disease of native coronary artery without angina pectoris: Secondary | ICD-10-CM | POA: Diagnosis not present

## 2024-02-24 DIAGNOSIS — I517 Cardiomegaly: Secondary | ICD-10-CM | POA: Diagnosis not present

## 2024-02-24 DIAGNOSIS — M19012 Primary osteoarthritis, left shoulder: Secondary | ICD-10-CM | POA: Diagnosis not present

## 2024-02-24 DIAGNOSIS — E222 Syndrome of inappropriate secretion of antidiuretic hormone: Secondary | ICD-10-CM | POA: Diagnosis not present

## 2024-02-24 DIAGNOSIS — M19031 Primary osteoarthritis, right wrist: Secondary | ICD-10-CM | POA: Diagnosis not present

## 2024-02-24 DIAGNOSIS — N400 Enlarged prostate without lower urinary tract symptoms: Secondary | ICD-10-CM | POA: Diagnosis not present

## 2024-02-24 DIAGNOSIS — R7989 Other specified abnormal findings of blood chemistry: Secondary | ICD-10-CM | POA: Diagnosis not present

## 2024-02-24 DIAGNOSIS — E785 Hyperlipidemia, unspecified: Secondary | ICD-10-CM | POA: Diagnosis not present

## 2024-02-24 DIAGNOSIS — R531 Weakness: Secondary | ICD-10-CM | POA: Diagnosis not present

## 2024-02-24 DIAGNOSIS — Z7409 Other reduced mobility: Secondary | ICD-10-CM | POA: Diagnosis not present

## 2024-02-24 DIAGNOSIS — R110 Nausea: Secondary | ICD-10-CM | POA: Diagnosis not present

## 2024-02-24 DIAGNOSIS — R001 Bradycardia, unspecified: Secondary | ICD-10-CM | POA: Diagnosis not present

## 2024-02-24 DIAGNOSIS — M25519 Pain in unspecified shoulder: Secondary | ICD-10-CM | POA: Diagnosis not present

## 2024-02-24 DIAGNOSIS — N179 Acute kidney failure, unspecified: Secondary | ICD-10-CM | POA: Diagnosis not present

## 2024-02-24 DIAGNOSIS — I130 Hypertensive heart and chronic kidney disease with heart failure and stage 1 through stage 4 chronic kidney disease, or unspecified chronic kidney disease: Secondary | ICD-10-CM | POA: Diagnosis not present

## 2024-02-24 DIAGNOSIS — S22020D Wedge compression fracture of second thoracic vertebra, subsequent encounter for fracture with routine healing: Secondary | ICD-10-CM | POA: Diagnosis not present

## 2024-02-25 DIAGNOSIS — I2510 Atherosclerotic heart disease of native coronary artery without angina pectoris: Secondary | ICD-10-CM | POA: Diagnosis not present

## 2024-02-25 DIAGNOSIS — E785 Hyperlipidemia, unspecified: Secondary | ICD-10-CM | POA: Diagnosis not present

## 2024-02-25 DIAGNOSIS — M25512 Pain in left shoulder: Secondary | ICD-10-CM | POA: Diagnosis not present

## 2024-02-25 DIAGNOSIS — E119 Type 2 diabetes mellitus without complications: Secondary | ICD-10-CM | POA: Diagnosis not present

## 2024-02-25 DIAGNOSIS — S46812A Strain of other muscles, fascia and tendons at shoulder and upper arm level, left arm, initial encounter: Secondary | ICD-10-CM | POA: Diagnosis not present

## 2024-02-25 DIAGNOSIS — E871 Hypo-osmolality and hyponatremia: Secondary | ICD-10-CM | POA: Diagnosis not present

## 2024-02-25 DIAGNOSIS — I10 Essential (primary) hypertension: Secondary | ICD-10-CM | POA: Diagnosis not present

## 2024-02-25 DIAGNOSIS — N400 Enlarged prostate without lower urinary tract symptoms: Secondary | ICD-10-CM | POA: Diagnosis not present

## 2024-02-25 DIAGNOSIS — R001 Bradycardia, unspecified: Secondary | ICD-10-CM | POA: Diagnosis not present

## 2024-02-26 DIAGNOSIS — N400 Enlarged prostate without lower urinary tract symptoms: Secondary | ICD-10-CM | POA: Diagnosis not present

## 2024-02-26 DIAGNOSIS — I10 Essential (primary) hypertension: Secondary | ICD-10-CM | POA: Diagnosis not present

## 2024-02-26 DIAGNOSIS — R001 Bradycardia, unspecified: Secondary | ICD-10-CM | POA: Diagnosis not present

## 2024-02-26 DIAGNOSIS — E871 Hypo-osmolality and hyponatremia: Secondary | ICD-10-CM | POA: Diagnosis not present

## 2024-02-26 DIAGNOSIS — E785 Hyperlipidemia, unspecified: Secondary | ICD-10-CM | POA: Diagnosis not present

## 2024-02-26 DIAGNOSIS — I2510 Atherosclerotic heart disease of native coronary artery without angina pectoris: Secondary | ICD-10-CM | POA: Diagnosis not present

## 2024-02-26 DIAGNOSIS — M25512 Pain in left shoulder: Secondary | ICD-10-CM | POA: Diagnosis not present

## 2024-02-26 DIAGNOSIS — M25531 Pain in right wrist: Secondary | ICD-10-CM | POA: Diagnosis not present

## 2024-02-26 DIAGNOSIS — M19031 Primary osteoarthritis, right wrist: Secondary | ICD-10-CM | POA: Diagnosis not present

## 2024-02-26 DIAGNOSIS — E119 Type 2 diabetes mellitus without complications: Secondary | ICD-10-CM | POA: Diagnosis not present

## 2024-02-27 DIAGNOSIS — E119 Type 2 diabetes mellitus without complications: Secondary | ICD-10-CM | POA: Diagnosis not present

## 2024-02-27 DIAGNOSIS — N400 Enlarged prostate without lower urinary tract symptoms: Secondary | ICD-10-CM | POA: Diagnosis not present

## 2024-02-27 DIAGNOSIS — I10 Essential (primary) hypertension: Secondary | ICD-10-CM | POA: Diagnosis not present

## 2024-02-27 DIAGNOSIS — R001 Bradycardia, unspecified: Secondary | ICD-10-CM | POA: Diagnosis not present

## 2024-02-27 DIAGNOSIS — E785 Hyperlipidemia, unspecified: Secondary | ICD-10-CM | POA: Diagnosis not present

## 2024-02-27 DIAGNOSIS — M25512 Pain in left shoulder: Secondary | ICD-10-CM | POA: Diagnosis not present

## 2024-02-27 DIAGNOSIS — I2510 Atherosclerotic heart disease of native coronary artery without angina pectoris: Secondary | ICD-10-CM | POA: Diagnosis not present

## 2024-02-27 DIAGNOSIS — E871 Hypo-osmolality and hyponatremia: Secondary | ICD-10-CM | POA: Diagnosis not present

## 2024-02-28 DIAGNOSIS — M25512 Pain in left shoulder: Secondary | ICD-10-CM | POA: Diagnosis not present

## 2024-02-28 DIAGNOSIS — E871 Hypo-osmolality and hyponatremia: Secondary | ICD-10-CM | POA: Diagnosis not present

## 2024-02-28 DIAGNOSIS — I10 Essential (primary) hypertension: Secondary | ICD-10-CM | POA: Diagnosis not present

## 2024-02-28 DIAGNOSIS — E119 Type 2 diabetes mellitus without complications: Secondary | ICD-10-CM | POA: Diagnosis not present

## 2024-02-28 DIAGNOSIS — E785 Hyperlipidemia, unspecified: Secondary | ICD-10-CM | POA: Diagnosis not present

## 2024-02-28 DIAGNOSIS — R001 Bradycardia, unspecified: Secondary | ICD-10-CM | POA: Diagnosis not present

## 2024-02-28 DIAGNOSIS — N400 Enlarged prostate without lower urinary tract symptoms: Secondary | ICD-10-CM | POA: Diagnosis not present

## 2024-02-29 DIAGNOSIS — N400 Enlarged prostate without lower urinary tract symptoms: Secondary | ICD-10-CM | POA: Diagnosis not present

## 2024-02-29 DIAGNOSIS — E785 Hyperlipidemia, unspecified: Secondary | ICD-10-CM | POA: Diagnosis not present

## 2024-02-29 DIAGNOSIS — R001 Bradycardia, unspecified: Secondary | ICD-10-CM | POA: Diagnosis not present

## 2024-02-29 DIAGNOSIS — M25512 Pain in left shoulder: Secondary | ICD-10-CM | POA: Diagnosis not present

## 2024-02-29 DIAGNOSIS — I10 Essential (primary) hypertension: Secondary | ICD-10-CM | POA: Diagnosis not present

## 2024-02-29 DIAGNOSIS — E119 Type 2 diabetes mellitus without complications: Secondary | ICD-10-CM | POA: Diagnosis not present

## 2024-02-29 DIAGNOSIS — E871 Hypo-osmolality and hyponatremia: Secondary | ICD-10-CM | POA: Diagnosis not present

## 2024-03-01 DIAGNOSIS — E871 Hypo-osmolality and hyponatremia: Secondary | ICD-10-CM | POA: Diagnosis not present

## 2024-03-01 DIAGNOSIS — Z7409 Other reduced mobility: Secondary | ICD-10-CM | POA: Diagnosis not present

## 2024-03-01 DIAGNOSIS — Z789 Other specified health status: Secondary | ICD-10-CM | POA: Diagnosis not present

## 2024-03-01 DIAGNOSIS — M19012 Primary osteoarthritis, left shoulder: Secondary | ICD-10-CM | POA: Diagnosis not present

## 2024-03-02 DIAGNOSIS — Z743 Need for continuous supervision: Secondary | ICD-10-CM | POA: Diagnosis not present

## 2024-03-02 DIAGNOSIS — R531 Weakness: Secondary | ICD-10-CM | POA: Diagnosis not present

## 2024-03-02 DIAGNOSIS — I5032 Chronic diastolic (congestive) heart failure: Secondary | ICD-10-CM | POA: Diagnosis not present

## 2024-03-02 DIAGNOSIS — E871 Hypo-osmolality and hyponatremia: Secondary | ICD-10-CM | POA: Diagnosis not present

## 2024-03-02 DIAGNOSIS — I10 Essential (primary) hypertension: Secondary | ICD-10-CM | POA: Diagnosis not present

## 2024-03-02 DIAGNOSIS — I5030 Unspecified diastolic (congestive) heart failure: Secondary | ICD-10-CM | POA: Diagnosis not present

## 2024-03-02 DIAGNOSIS — D649 Anemia, unspecified: Secondary | ICD-10-CM | POA: Diagnosis not present

## 2024-03-02 DIAGNOSIS — E119 Type 2 diabetes mellitus without complications: Secondary | ICD-10-CM | POA: Diagnosis not present

## 2024-03-02 DIAGNOSIS — K5900 Constipation, unspecified: Secondary | ICD-10-CM | POA: Diagnosis not present

## 2024-03-02 DIAGNOSIS — S22070D Wedge compression fracture of T9-T10 vertebra, subsequent encounter for fracture with routine healing: Secondary | ICD-10-CM | POA: Diagnosis not present

## 2024-03-02 DIAGNOSIS — L539 Erythematous condition, unspecified: Secondary | ICD-10-CM | POA: Diagnosis not present

## 2024-03-02 DIAGNOSIS — I152 Hypertension secondary to endocrine disorders: Secondary | ICD-10-CM | POA: Diagnosis not present

## 2024-03-02 DIAGNOSIS — Z794 Long term (current) use of insulin: Secondary | ICD-10-CM | POA: Diagnosis not present

## 2024-03-02 DIAGNOSIS — R0602 Shortness of breath: Secondary | ICD-10-CM | POA: Diagnosis not present

## 2024-03-02 DIAGNOSIS — E875 Hyperkalemia: Secondary | ICD-10-CM | POA: Diagnosis not present

## 2024-03-02 DIAGNOSIS — R001 Bradycardia, unspecified: Secondary | ICD-10-CM | POA: Diagnosis not present

## 2024-03-02 DIAGNOSIS — I110 Hypertensive heart disease with heart failure: Secondary | ICD-10-CM | POA: Diagnosis not present

## 2024-03-02 DIAGNOSIS — S22020D Wedge compression fracture of second thoracic vertebra, subsequent encounter for fracture with routine healing: Secondary | ICD-10-CM | POA: Diagnosis not present

## 2024-03-02 DIAGNOSIS — R7989 Other specified abnormal findings of blood chemistry: Secondary | ICD-10-CM | POA: Diagnosis not present

## 2024-03-02 DIAGNOSIS — M19012 Primary osteoarthritis, left shoulder: Secondary | ICD-10-CM | POA: Diagnosis not present

## 2024-03-02 DIAGNOSIS — R296 Repeated falls: Secondary | ICD-10-CM | POA: Diagnosis not present

## 2024-03-02 DIAGNOSIS — F4329 Adjustment disorder with other symptoms: Secondary | ICD-10-CM | POA: Diagnosis not present

## 2024-03-02 DIAGNOSIS — M546 Pain in thoracic spine: Secondary | ICD-10-CM | POA: Diagnosis not present

## 2024-03-02 DIAGNOSIS — N179 Acute kidney failure, unspecified: Secondary | ICD-10-CM | POA: Diagnosis not present

## 2024-03-03 DIAGNOSIS — M546 Pain in thoracic spine: Secondary | ICD-10-CM | POA: Diagnosis not present

## 2024-03-03 DIAGNOSIS — S22020D Wedge compression fracture of second thoracic vertebra, subsequent encounter for fracture with routine healing: Secondary | ICD-10-CM | POA: Diagnosis not present

## 2024-03-03 DIAGNOSIS — E871 Hypo-osmolality and hyponatremia: Secondary | ICD-10-CM | POA: Diagnosis not present

## 2024-03-03 DIAGNOSIS — I110 Hypertensive heart disease with heart failure: Secondary | ICD-10-CM | POA: Diagnosis not present

## 2024-03-03 DIAGNOSIS — I5032 Chronic diastolic (congestive) heart failure: Secondary | ICD-10-CM | POA: Diagnosis not present

## 2024-03-03 DIAGNOSIS — E119 Type 2 diabetes mellitus without complications: Secondary | ICD-10-CM | POA: Diagnosis not present

## 2024-03-04 DIAGNOSIS — K5900 Constipation, unspecified: Secondary | ICD-10-CM | POA: Diagnosis not present

## 2024-03-04 DIAGNOSIS — S22020D Wedge compression fracture of second thoracic vertebra, subsequent encounter for fracture with routine healing: Secondary | ICD-10-CM | POA: Diagnosis not present

## 2024-03-04 DIAGNOSIS — D649 Anemia, unspecified: Secondary | ICD-10-CM | POA: Diagnosis not present

## 2024-03-07 DIAGNOSIS — E871 Hypo-osmolality and hyponatremia: Secondary | ICD-10-CM | POA: Diagnosis not present

## 2024-03-07 DIAGNOSIS — I110 Hypertensive heart disease with heart failure: Secondary | ICD-10-CM | POA: Diagnosis not present

## 2024-03-09 DIAGNOSIS — S22070D Wedge compression fracture of T9-T10 vertebra, subsequent encounter for fracture with routine healing: Secondary | ICD-10-CM | POA: Diagnosis not present

## 2024-03-14 DIAGNOSIS — I110 Hypertensive heart disease with heart failure: Secondary | ICD-10-CM | POA: Diagnosis not present

## 2024-03-14 DIAGNOSIS — I5032 Chronic diastolic (congestive) heart failure: Secondary | ICD-10-CM | POA: Diagnosis not present

## 2024-03-14 DIAGNOSIS — M546 Pain in thoracic spine: Secondary | ICD-10-CM | POA: Diagnosis not present

## 2024-03-14 DIAGNOSIS — S22020D Wedge compression fracture of second thoracic vertebra, subsequent encounter for fracture with routine healing: Secondary | ICD-10-CM | POA: Diagnosis not present

## 2024-03-14 DIAGNOSIS — E119 Type 2 diabetes mellitus without complications: Secondary | ICD-10-CM | POA: Diagnosis not present

## 2024-03-15 DIAGNOSIS — E1122 Type 2 diabetes mellitus with diabetic chronic kidney disease: Secondary | ICD-10-CM | POA: Diagnosis not present

## 2024-03-15 DIAGNOSIS — I130 Hypertensive heart and chronic kidney disease with heart failure and stage 1 through stage 4 chronic kidney disease, or unspecified chronic kidney disease: Secondary | ICD-10-CM | POA: Diagnosis not present

## 2024-03-15 DIAGNOSIS — N400 Enlarged prostate without lower urinary tract symptoms: Secondary | ICD-10-CM | POA: Diagnosis not present

## 2024-03-15 DIAGNOSIS — S46012D Strain of muscle(s) and tendon(s) of the rotator cuff of left shoulder, subsequent encounter: Secondary | ICD-10-CM | POA: Diagnosis not present

## 2024-03-15 DIAGNOSIS — N183 Chronic kidney disease, stage 3 unspecified: Secondary | ICD-10-CM | POA: Diagnosis not present

## 2024-03-15 DIAGNOSIS — I2510 Atherosclerotic heart disease of native coronary artery without angina pectoris: Secondary | ICD-10-CM | POA: Diagnosis not present

## 2024-03-15 DIAGNOSIS — S22070D Wedge compression fracture of T9-T10 vertebra, subsequent encounter for fracture with routine healing: Secondary | ICD-10-CM | POA: Diagnosis not present

## 2024-03-15 DIAGNOSIS — E871 Hypo-osmolality and hyponatremia: Secondary | ICD-10-CM | POA: Diagnosis not present

## 2024-03-15 DIAGNOSIS — E785 Hyperlipidemia, unspecified: Secondary | ICD-10-CM | POA: Diagnosis not present

## 2024-03-15 DIAGNOSIS — I5032 Chronic diastolic (congestive) heart failure: Secondary | ICD-10-CM | POA: Diagnosis not present

## 2024-03-16 DIAGNOSIS — N183 Chronic kidney disease, stage 3 unspecified: Secondary | ICD-10-CM | POA: Diagnosis not present

## 2024-03-16 DIAGNOSIS — I5032 Chronic diastolic (congestive) heart failure: Secondary | ICD-10-CM | POA: Diagnosis not present

## 2024-03-16 DIAGNOSIS — E1122 Type 2 diabetes mellitus with diabetic chronic kidney disease: Secondary | ICD-10-CM | POA: Diagnosis not present

## 2024-03-16 DIAGNOSIS — N400 Enlarged prostate without lower urinary tract symptoms: Secondary | ICD-10-CM | POA: Diagnosis not present

## 2024-03-16 DIAGNOSIS — E871 Hypo-osmolality and hyponatremia: Secondary | ICD-10-CM | POA: Diagnosis not present

## 2024-03-16 DIAGNOSIS — S46012D Strain of muscle(s) and tendon(s) of the rotator cuff of left shoulder, subsequent encounter: Secondary | ICD-10-CM | POA: Diagnosis not present

## 2024-03-16 DIAGNOSIS — I2510 Atherosclerotic heart disease of native coronary artery without angina pectoris: Secondary | ICD-10-CM | POA: Diagnosis not present

## 2024-03-16 DIAGNOSIS — I130 Hypertensive heart and chronic kidney disease with heart failure and stage 1 through stage 4 chronic kidney disease, or unspecified chronic kidney disease: Secondary | ICD-10-CM | POA: Diagnosis not present

## 2024-03-16 DIAGNOSIS — E785 Hyperlipidemia, unspecified: Secondary | ICD-10-CM | POA: Diagnosis not present

## 2024-03-23 ENCOUNTER — Other Ambulatory Visit (HOSPITAL_BASED_OUTPATIENT_CLINIC_OR_DEPARTMENT_OTHER): Payer: Self-pay | Admitting: Cardiovascular Disease

## 2024-03-23 DIAGNOSIS — I130 Hypertensive heart and chronic kidney disease with heart failure and stage 1 through stage 4 chronic kidney disease, or unspecified chronic kidney disease: Secondary | ICD-10-CM | POA: Diagnosis not present

## 2024-03-23 DIAGNOSIS — E1122 Type 2 diabetes mellitus with diabetic chronic kidney disease: Secondary | ICD-10-CM | POA: Diagnosis not present

## 2024-03-23 DIAGNOSIS — I5032 Chronic diastolic (congestive) heart failure: Secondary | ICD-10-CM | POA: Diagnosis not present

## 2024-03-23 DIAGNOSIS — E871 Hypo-osmolality and hyponatremia: Secondary | ICD-10-CM | POA: Diagnosis not present

## 2024-03-23 DIAGNOSIS — S46012D Strain of muscle(s) and tendon(s) of the rotator cuff of left shoulder, subsequent encounter: Secondary | ICD-10-CM | POA: Diagnosis not present

## 2024-03-23 DIAGNOSIS — E785 Hyperlipidemia, unspecified: Secondary | ICD-10-CM | POA: Diagnosis not present

## 2024-03-23 DIAGNOSIS — N400 Enlarged prostate without lower urinary tract symptoms: Secondary | ICD-10-CM | POA: Diagnosis not present

## 2024-03-23 DIAGNOSIS — N183 Chronic kidney disease, stage 3 unspecified: Secondary | ICD-10-CM | POA: Diagnosis not present

## 2024-03-23 DIAGNOSIS — I2510 Atherosclerotic heart disease of native coronary artery without angina pectoris: Secondary | ICD-10-CM | POA: Diagnosis not present

## 2024-03-25 DIAGNOSIS — E871 Hypo-osmolality and hyponatremia: Secondary | ICD-10-CM | POA: Diagnosis not present

## 2024-03-25 DIAGNOSIS — I130 Hypertensive heart and chronic kidney disease with heart failure and stage 1 through stage 4 chronic kidney disease, or unspecified chronic kidney disease: Secondary | ICD-10-CM | POA: Diagnosis not present

## 2024-03-25 DIAGNOSIS — E1122 Type 2 diabetes mellitus with diabetic chronic kidney disease: Secondary | ICD-10-CM | POA: Diagnosis not present

## 2024-03-25 DIAGNOSIS — I5032 Chronic diastolic (congestive) heart failure: Secondary | ICD-10-CM | POA: Diagnosis not present

## 2024-03-25 DIAGNOSIS — N400 Enlarged prostate without lower urinary tract symptoms: Secondary | ICD-10-CM | POA: Diagnosis not present

## 2024-03-25 DIAGNOSIS — N183 Chronic kidney disease, stage 3 unspecified: Secondary | ICD-10-CM | POA: Diagnosis not present

## 2024-03-25 DIAGNOSIS — I2510 Atherosclerotic heart disease of native coronary artery without angina pectoris: Secondary | ICD-10-CM | POA: Diagnosis not present

## 2024-03-25 DIAGNOSIS — S46012D Strain of muscle(s) and tendon(s) of the rotator cuff of left shoulder, subsequent encounter: Secondary | ICD-10-CM | POA: Diagnosis not present

## 2024-03-25 DIAGNOSIS — E785 Hyperlipidemia, unspecified: Secondary | ICD-10-CM | POA: Diagnosis not present

## 2024-03-29 DIAGNOSIS — I130 Hypertensive heart and chronic kidney disease with heart failure and stage 1 through stage 4 chronic kidney disease, or unspecified chronic kidney disease: Secondary | ICD-10-CM | POA: Diagnosis not present

## 2024-03-29 DIAGNOSIS — S46012D Strain of muscle(s) and tendon(s) of the rotator cuff of left shoulder, subsequent encounter: Secondary | ICD-10-CM | POA: Diagnosis not present

## 2024-03-29 DIAGNOSIS — E871 Hypo-osmolality and hyponatremia: Secondary | ICD-10-CM | POA: Diagnosis not present

## 2024-03-29 DIAGNOSIS — I2510 Atherosclerotic heart disease of native coronary artery without angina pectoris: Secondary | ICD-10-CM | POA: Diagnosis not present

## 2024-03-29 DIAGNOSIS — E785 Hyperlipidemia, unspecified: Secondary | ICD-10-CM | POA: Diagnosis not present

## 2024-03-29 DIAGNOSIS — N400 Enlarged prostate without lower urinary tract symptoms: Secondary | ICD-10-CM | POA: Diagnosis not present

## 2024-03-29 DIAGNOSIS — N183 Chronic kidney disease, stage 3 unspecified: Secondary | ICD-10-CM | POA: Diagnosis not present

## 2024-03-29 DIAGNOSIS — I5032 Chronic diastolic (congestive) heart failure: Secondary | ICD-10-CM | POA: Diagnosis not present

## 2024-03-29 DIAGNOSIS — E1122 Type 2 diabetes mellitus with diabetic chronic kidney disease: Secondary | ICD-10-CM | POA: Diagnosis not present

## 2024-03-31 DIAGNOSIS — I2510 Atherosclerotic heart disease of native coronary artery without angina pectoris: Secondary | ICD-10-CM | POA: Diagnosis not present

## 2024-03-31 DIAGNOSIS — E1122 Type 2 diabetes mellitus with diabetic chronic kidney disease: Secondary | ICD-10-CM | POA: Diagnosis not present

## 2024-03-31 DIAGNOSIS — S46012D Strain of muscle(s) and tendon(s) of the rotator cuff of left shoulder, subsequent encounter: Secondary | ICD-10-CM | POA: Diagnosis not present

## 2024-03-31 DIAGNOSIS — E871 Hypo-osmolality and hyponatremia: Secondary | ICD-10-CM | POA: Diagnosis not present

## 2024-03-31 DIAGNOSIS — N183 Chronic kidney disease, stage 3 unspecified: Secondary | ICD-10-CM | POA: Diagnosis not present

## 2024-03-31 DIAGNOSIS — N400 Enlarged prostate without lower urinary tract symptoms: Secondary | ICD-10-CM | POA: Diagnosis not present

## 2024-03-31 DIAGNOSIS — E785 Hyperlipidemia, unspecified: Secondary | ICD-10-CM | POA: Diagnosis not present

## 2024-03-31 DIAGNOSIS — I5032 Chronic diastolic (congestive) heart failure: Secondary | ICD-10-CM | POA: Diagnosis not present

## 2024-03-31 DIAGNOSIS — I130 Hypertensive heart and chronic kidney disease with heart failure and stage 1 through stage 4 chronic kidney disease, or unspecified chronic kidney disease: Secondary | ICD-10-CM | POA: Diagnosis not present

## 2024-04-06 DIAGNOSIS — I130 Hypertensive heart and chronic kidney disease with heart failure and stage 1 through stage 4 chronic kidney disease, or unspecified chronic kidney disease: Secondary | ICD-10-CM | POA: Diagnosis not present

## 2024-04-06 DIAGNOSIS — I5032 Chronic diastolic (congestive) heart failure: Secondary | ICD-10-CM | POA: Diagnosis not present

## 2024-04-06 DIAGNOSIS — N183 Chronic kidney disease, stage 3 unspecified: Secondary | ICD-10-CM | POA: Diagnosis not present

## 2024-04-06 DIAGNOSIS — E785 Hyperlipidemia, unspecified: Secondary | ICD-10-CM | POA: Diagnosis not present

## 2024-04-06 DIAGNOSIS — E1122 Type 2 diabetes mellitus with diabetic chronic kidney disease: Secondary | ICD-10-CM | POA: Diagnosis not present

## 2024-04-06 DIAGNOSIS — N400 Enlarged prostate without lower urinary tract symptoms: Secondary | ICD-10-CM | POA: Diagnosis not present

## 2024-04-06 DIAGNOSIS — E871 Hypo-osmolality and hyponatremia: Secondary | ICD-10-CM | POA: Diagnosis not present

## 2024-04-06 DIAGNOSIS — S46012D Strain of muscle(s) and tendon(s) of the rotator cuff of left shoulder, subsequent encounter: Secondary | ICD-10-CM | POA: Diagnosis not present

## 2024-04-06 DIAGNOSIS — I2510 Atherosclerotic heart disease of native coronary artery without angina pectoris: Secondary | ICD-10-CM | POA: Diagnosis not present

## 2024-04-07 DIAGNOSIS — I10 Essential (primary) hypertension: Secondary | ICD-10-CM | POA: Diagnosis not present

## 2024-04-07 DIAGNOSIS — I5032 Chronic diastolic (congestive) heart failure: Secondary | ICD-10-CM | POA: Diagnosis not present

## 2024-04-07 DIAGNOSIS — K5900 Constipation, unspecified: Secondary | ICD-10-CM | POA: Diagnosis not present

## 2024-04-07 DIAGNOSIS — E871 Hypo-osmolality and hyponatremia: Secondary | ICD-10-CM | POA: Diagnosis not present

## 2024-04-07 DIAGNOSIS — I2510 Atherosclerotic heart disease of native coronary artery without angina pectoris: Secondary | ICD-10-CM | POA: Diagnosis not present

## 2024-04-07 DIAGNOSIS — Z9181 History of falling: Secondary | ICD-10-CM | POA: Diagnosis not present

## 2024-04-08 DIAGNOSIS — E785 Hyperlipidemia, unspecified: Secondary | ICD-10-CM | POA: Diagnosis not present

## 2024-04-08 DIAGNOSIS — E1122 Type 2 diabetes mellitus with diabetic chronic kidney disease: Secondary | ICD-10-CM | POA: Diagnosis not present

## 2024-04-08 DIAGNOSIS — N183 Chronic kidney disease, stage 3 unspecified: Secondary | ICD-10-CM | POA: Diagnosis not present

## 2024-04-08 DIAGNOSIS — I5032 Chronic diastolic (congestive) heart failure: Secondary | ICD-10-CM | POA: Diagnosis not present

## 2024-04-08 DIAGNOSIS — E871 Hypo-osmolality and hyponatremia: Secondary | ICD-10-CM | POA: Diagnosis not present

## 2024-04-08 DIAGNOSIS — N400 Enlarged prostate without lower urinary tract symptoms: Secondary | ICD-10-CM | POA: Diagnosis not present

## 2024-04-08 DIAGNOSIS — I2510 Atherosclerotic heart disease of native coronary artery without angina pectoris: Secondary | ICD-10-CM | POA: Diagnosis not present

## 2024-04-08 DIAGNOSIS — I130 Hypertensive heart and chronic kidney disease with heart failure and stage 1 through stage 4 chronic kidney disease, or unspecified chronic kidney disease: Secondary | ICD-10-CM | POA: Diagnosis not present

## 2024-04-08 DIAGNOSIS — S46012D Strain of muscle(s) and tendon(s) of the rotator cuff of left shoulder, subsequent encounter: Secondary | ICD-10-CM | POA: Diagnosis not present

## 2024-04-13 DIAGNOSIS — K5900 Constipation, unspecified: Secondary | ICD-10-CM | POA: Diagnosis not present

## 2024-04-14 DIAGNOSIS — N183 Chronic kidney disease, stage 3 unspecified: Secondary | ICD-10-CM | POA: Diagnosis not present

## 2024-04-14 DIAGNOSIS — E871 Hypo-osmolality and hyponatremia: Secondary | ICD-10-CM | POA: Diagnosis not present

## 2024-04-14 DIAGNOSIS — E1122 Type 2 diabetes mellitus with diabetic chronic kidney disease: Secondary | ICD-10-CM | POA: Diagnosis not present

## 2024-04-14 DIAGNOSIS — E785 Hyperlipidemia, unspecified: Secondary | ICD-10-CM | POA: Diagnosis not present

## 2024-04-14 DIAGNOSIS — I2510 Atherosclerotic heart disease of native coronary artery without angina pectoris: Secondary | ICD-10-CM | POA: Diagnosis not present

## 2024-04-14 DIAGNOSIS — S46012D Strain of muscle(s) and tendon(s) of the rotator cuff of left shoulder, subsequent encounter: Secondary | ICD-10-CM | POA: Diagnosis not present

## 2024-04-14 DIAGNOSIS — I130 Hypertensive heart and chronic kidney disease with heart failure and stage 1 through stage 4 chronic kidney disease, or unspecified chronic kidney disease: Secondary | ICD-10-CM | POA: Diagnosis not present

## 2024-04-14 DIAGNOSIS — N400 Enlarged prostate without lower urinary tract symptoms: Secondary | ICD-10-CM | POA: Diagnosis not present

## 2024-04-14 DIAGNOSIS — I5032 Chronic diastolic (congestive) heart failure: Secondary | ICD-10-CM | POA: Diagnosis not present

## 2024-04-15 ENCOUNTER — Other Ambulatory Visit (HOSPITAL_BASED_OUTPATIENT_CLINIC_OR_DEPARTMENT_OTHER): Payer: Self-pay | Admitting: Cardiovascular Disease

## 2024-04-15 DIAGNOSIS — I130 Hypertensive heart and chronic kidney disease with heart failure and stage 1 through stage 4 chronic kidney disease, or unspecified chronic kidney disease: Secondary | ICD-10-CM | POA: Diagnosis not present

## 2024-04-15 DIAGNOSIS — I5020 Unspecified systolic (congestive) heart failure: Secondary | ICD-10-CM | POA: Diagnosis not present

## 2024-04-15 DIAGNOSIS — I5032 Chronic diastolic (congestive) heart failure: Secondary | ICD-10-CM | POA: Diagnosis not present

## 2024-04-15 DIAGNOSIS — S22070D Wedge compression fracture of T9-T10 vertebra, subsequent encounter for fracture with routine healing: Secondary | ICD-10-CM | POA: Diagnosis not present

## 2024-04-15 DIAGNOSIS — E1122 Type 2 diabetes mellitus with diabetic chronic kidney disease: Secondary | ICD-10-CM | POA: Diagnosis not present

## 2024-04-15 DIAGNOSIS — I2510 Atherosclerotic heart disease of native coronary artery without angina pectoris: Secondary | ICD-10-CM | POA: Diagnosis not present

## 2024-04-15 DIAGNOSIS — E785 Hyperlipidemia, unspecified: Secondary | ICD-10-CM | POA: Diagnosis not present

## 2024-04-15 DIAGNOSIS — E871 Hypo-osmolality and hyponatremia: Secondary | ICD-10-CM | POA: Diagnosis not present

## 2024-04-15 DIAGNOSIS — N183 Chronic kidney disease, stage 3 unspecified: Secondary | ICD-10-CM | POA: Diagnosis not present

## 2024-04-15 DIAGNOSIS — N400 Enlarged prostate without lower urinary tract symptoms: Secondary | ICD-10-CM | POA: Diagnosis not present

## 2024-04-15 DIAGNOSIS — S46012D Strain of muscle(s) and tendon(s) of the rotator cuff of left shoulder, subsequent encounter: Secondary | ICD-10-CM | POA: Diagnosis not present

## 2024-04-19 DIAGNOSIS — I110 Hypertensive heart disease with heart failure: Secondary | ICD-10-CM | POA: Diagnosis not present

## 2024-04-19 DIAGNOSIS — R7989 Other specified abnormal findings of blood chemistry: Secondary | ICD-10-CM | POA: Diagnosis not present

## 2024-04-19 DIAGNOSIS — I509 Heart failure, unspecified: Secondary | ICD-10-CM | POA: Diagnosis not present

## 2024-04-19 DIAGNOSIS — M7989 Other specified soft tissue disorders: Secondary | ICD-10-CM | POA: Diagnosis not present

## 2024-04-19 DIAGNOSIS — D649 Anemia, unspecified: Secondary | ICD-10-CM | POA: Diagnosis not present

## 2024-04-19 DIAGNOSIS — R001 Bradycardia, unspecified: Secondary | ICD-10-CM | POA: Diagnosis not present

## 2024-04-19 DIAGNOSIS — R0989 Other specified symptoms and signs involving the circulatory and respiratory systems: Secondary | ICD-10-CM | POA: Diagnosis not present

## 2024-04-19 DIAGNOSIS — N179 Acute kidney failure, unspecified: Secondary | ICD-10-CM | POA: Diagnosis not present

## 2024-04-19 DIAGNOSIS — E871 Hypo-osmolality and hyponatremia: Secondary | ICD-10-CM | POA: Diagnosis not present

## 2024-04-19 DIAGNOSIS — R0602 Shortness of breath: Secondary | ICD-10-CM | POA: Diagnosis not present

## 2024-04-19 DIAGNOSIS — J90 Pleural effusion, not elsewhere classified: Secondary | ICD-10-CM | POA: Diagnosis not present

## 2024-04-20 DIAGNOSIS — E871 Hypo-osmolality and hyponatremia: Secondary | ICD-10-CM | POA: Diagnosis not present

## 2024-04-20 DIAGNOSIS — M7989 Other specified soft tissue disorders: Secondary | ICD-10-CM | POA: Diagnosis not present

## 2024-04-20 DIAGNOSIS — N179 Acute kidney failure, unspecified: Secondary | ICD-10-CM | POA: Diagnosis not present

## 2024-04-20 DIAGNOSIS — I509 Heart failure, unspecified: Secondary | ICD-10-CM | POA: Diagnosis not present

## 2024-04-20 DIAGNOSIS — D649 Anemia, unspecified: Secondary | ICD-10-CM | POA: Diagnosis not present

## 2024-04-20 DIAGNOSIS — R001 Bradycardia, unspecified: Secondary | ICD-10-CM | POA: Diagnosis not present

## 2024-04-20 DIAGNOSIS — I110 Hypertensive heart disease with heart failure: Secondary | ICD-10-CM | POA: Diagnosis not present

## 2024-04-21 ENCOUNTER — Other Ambulatory Visit (HOSPITAL_BASED_OUTPATIENT_CLINIC_OR_DEPARTMENT_OTHER): Payer: Self-pay | Admitting: Cardiovascular Disease

## 2024-04-25 DIAGNOSIS — I5020 Unspecified systolic (congestive) heart failure: Secondary | ICD-10-CM | POA: Diagnosis not present

## 2024-04-25 DIAGNOSIS — I110 Hypertensive heart disease with heart failure: Secondary | ICD-10-CM | POA: Diagnosis not present

## 2024-04-25 DIAGNOSIS — I129 Hypertensive chronic kidney disease with stage 1 through stage 4 chronic kidney disease, or unspecified chronic kidney disease: Secondary | ICD-10-CM | POA: Diagnosis not present

## 2024-04-25 DIAGNOSIS — I5032 Chronic diastolic (congestive) heart failure: Secondary | ICD-10-CM | POA: Diagnosis not present

## 2024-04-25 DIAGNOSIS — M4696 Unspecified inflammatory spondylopathy, lumbar region: Secondary | ICD-10-CM | POA: Diagnosis not present

## 2024-04-29 ENCOUNTER — Other Ambulatory Visit (HOSPITAL_BASED_OUTPATIENT_CLINIC_OR_DEPARTMENT_OTHER): Payer: Self-pay | Admitting: Cardiovascular Disease

## 2024-05-01 DIAGNOSIS — I495 Sick sinus syndrome: Secondary | ICD-10-CM | POA: Diagnosis not present

## 2024-05-01 DIAGNOSIS — I509 Heart failure, unspecified: Secondary | ICD-10-CM | POA: Diagnosis not present

## 2024-05-01 DIAGNOSIS — J90 Pleural effusion, not elsewhere classified: Secondary | ICD-10-CM | POA: Diagnosis not present

## 2024-05-01 DIAGNOSIS — E871 Hypo-osmolality and hyponatremia: Secondary | ICD-10-CM | POA: Diagnosis not present

## 2024-05-01 DIAGNOSIS — J9811 Atelectasis: Secondary | ICD-10-CM | POA: Diagnosis not present

## 2024-05-01 DIAGNOSIS — I5033 Acute on chronic diastolic (congestive) heart failure: Secondary | ICD-10-CM | POA: Diagnosis not present

## 2024-05-01 DIAGNOSIS — J45909 Unspecified asthma, uncomplicated: Secondary | ICD-10-CM | POA: Diagnosis not present

## 2024-05-01 DIAGNOSIS — E1122 Type 2 diabetes mellitus with diabetic chronic kidney disease: Secondary | ICD-10-CM | POA: Diagnosis not present

## 2024-05-01 DIAGNOSIS — N281 Cyst of kidney, acquired: Secondary | ICD-10-CM | POA: Diagnosis not present

## 2024-05-01 DIAGNOSIS — D649 Anemia, unspecified: Secondary | ICD-10-CM | POA: Diagnosis not present

## 2024-05-01 DIAGNOSIS — R0602 Shortness of breath: Secondary | ICD-10-CM | POA: Diagnosis not present

## 2024-05-01 DIAGNOSIS — I110 Hypertensive heart disease with heart failure: Secondary | ICD-10-CM | POA: Diagnosis not present

## 2024-05-01 DIAGNOSIS — Z743 Need for continuous supervision: Secondary | ICD-10-CM | POA: Diagnosis not present

## 2024-05-01 DIAGNOSIS — I130 Hypertensive heart and chronic kidney disease with heart failure and stage 1 through stage 4 chronic kidney disease, or unspecified chronic kidney disease: Secondary | ICD-10-CM | POA: Diagnosis not present

## 2024-05-01 DIAGNOSIS — Z950 Presence of cardiac pacemaker: Secondary | ICD-10-CM | POA: Diagnosis not present

## 2024-05-01 DIAGNOSIS — R001 Bradycardia, unspecified: Secondary | ICD-10-CM | POA: Diagnosis not present

## 2024-05-01 DIAGNOSIS — R531 Weakness: Secondary | ICD-10-CM | POA: Diagnosis not present

## 2024-05-01 DIAGNOSIS — I272 Pulmonary hypertension, unspecified: Secondary | ICD-10-CM | POA: Diagnosis not present

## 2024-05-01 DIAGNOSIS — I4589 Other specified conduction disorders: Secondary | ICD-10-CM | POA: Diagnosis not present

## 2024-05-01 DIAGNOSIS — N183 Chronic kidney disease, stage 3 unspecified: Secondary | ICD-10-CM | POA: Diagnosis not present

## 2024-05-01 DIAGNOSIS — I499 Cardiac arrhythmia, unspecified: Secondary | ICD-10-CM | POA: Diagnosis not present

## 2024-05-01 DIAGNOSIS — I491 Atrial premature depolarization: Secondary | ICD-10-CM | POA: Diagnosis not present

## 2024-05-01 DIAGNOSIS — N179 Acute kidney failure, unspecified: Secondary | ICD-10-CM | POA: Diagnosis not present

## 2024-05-01 DIAGNOSIS — R0989 Other specified symptoms and signs involving the circulatory and respiratory systems: Secondary | ICD-10-CM | POA: Diagnosis not present

## 2024-05-01 DIAGNOSIS — I517 Cardiomegaly: Secondary | ICD-10-CM | POA: Diagnosis not present

## 2024-05-02 DIAGNOSIS — R001 Bradycardia, unspecified: Secondary | ICD-10-CM | POA: Diagnosis not present

## 2024-05-02 DIAGNOSIS — N179 Acute kidney failure, unspecified: Secondary | ICD-10-CM | POA: Diagnosis not present

## 2024-05-03 DIAGNOSIS — R0602 Shortness of breath: Secondary | ICD-10-CM | POA: Diagnosis not present

## 2024-05-04 DIAGNOSIS — R0602 Shortness of breath: Secondary | ICD-10-CM | POA: Diagnosis not present

## 2024-05-05 DIAGNOSIS — R0602 Shortness of breath: Secondary | ICD-10-CM | POA: Diagnosis not present

## 2024-05-06 DIAGNOSIS — R0602 Shortness of breath: Secondary | ICD-10-CM | POA: Diagnosis not present

## 2024-05-07 DIAGNOSIS — R0602 Shortness of breath: Secondary | ICD-10-CM | POA: Diagnosis not present

## 2024-05-08 DIAGNOSIS — R0602 Shortness of breath: Secondary | ICD-10-CM | POA: Diagnosis not present

## 2024-05-11 DIAGNOSIS — E119 Type 2 diabetes mellitus without complications: Secondary | ICD-10-CM | POA: Diagnosis not present

## 2024-05-11 DIAGNOSIS — R1312 Dysphagia, oropharyngeal phase: Secondary | ICD-10-CM | POA: Diagnosis not present

## 2024-05-11 DIAGNOSIS — K589 Irritable bowel syndrome without diarrhea: Secondary | ICD-10-CM | POA: Diagnosis not present

## 2024-05-11 DIAGNOSIS — J45901 Unspecified asthma with (acute) exacerbation: Secondary | ICD-10-CM | POA: Diagnosis not present

## 2024-05-11 DIAGNOSIS — R2681 Unsteadiness on feet: Secondary | ICD-10-CM | POA: Diagnosis not present

## 2024-05-11 DIAGNOSIS — N400 Enlarged prostate without lower urinary tract symptoms: Secondary | ICD-10-CM | POA: Diagnosis not present

## 2024-05-11 DIAGNOSIS — T82847D Pain from cardiac prosthetic devices, implants and grafts, subsequent encounter: Secondary | ICD-10-CM | POA: Diagnosis not present

## 2024-05-11 DIAGNOSIS — R41841 Cognitive communication deficit: Secondary | ICD-10-CM | POA: Diagnosis not present

## 2024-05-11 DIAGNOSIS — I5033 Acute on chronic diastolic (congestive) heart failure: Secondary | ICD-10-CM | POA: Diagnosis not present

## 2024-05-11 DIAGNOSIS — J302 Other seasonal allergic rhinitis: Secondary | ICD-10-CM | POA: Diagnosis not present

## 2024-05-11 DIAGNOSIS — I110 Hypertensive heart disease with heart failure: Secondary | ICD-10-CM | POA: Diagnosis not present

## 2024-05-12 DIAGNOSIS — R2681 Unsteadiness on feet: Secondary | ICD-10-CM | POA: Diagnosis not present

## 2024-05-12 DIAGNOSIS — R1312 Dysphagia, oropharyngeal phase: Secondary | ICD-10-CM | POA: Diagnosis not present

## 2024-05-12 DIAGNOSIS — R41841 Cognitive communication deficit: Secondary | ICD-10-CM | POA: Diagnosis not present

## 2024-05-12 DIAGNOSIS — I5033 Acute on chronic diastolic (congestive) heart failure: Secondary | ICD-10-CM | POA: Diagnosis not present

## 2024-05-13 DIAGNOSIS — I5033 Acute on chronic diastolic (congestive) heart failure: Secondary | ICD-10-CM | POA: Diagnosis not present

## 2024-05-13 DIAGNOSIS — T82847D Pain from cardiac prosthetic devices, implants and grafts, subsequent encounter: Secondary | ICD-10-CM | POA: Diagnosis not present

## 2024-05-13 DIAGNOSIS — R41841 Cognitive communication deficit: Secondary | ICD-10-CM | POA: Diagnosis not present

## 2024-05-13 DIAGNOSIS — I110 Hypertensive heart disease with heart failure: Secondary | ICD-10-CM | POA: Diagnosis not present

## 2024-05-13 DIAGNOSIS — R1312 Dysphagia, oropharyngeal phase: Secondary | ICD-10-CM | POA: Diagnosis not present

## 2024-05-13 DIAGNOSIS — R2681 Unsteadiness on feet: Secondary | ICD-10-CM | POA: Diagnosis not present

## 2024-05-14 DIAGNOSIS — I5033 Acute on chronic diastolic (congestive) heart failure: Secondary | ICD-10-CM | POA: Diagnosis not present

## 2024-05-14 DIAGNOSIS — R2681 Unsteadiness on feet: Secondary | ICD-10-CM | POA: Diagnosis not present

## 2024-05-14 DIAGNOSIS — R1312 Dysphagia, oropharyngeal phase: Secondary | ICD-10-CM | POA: Diagnosis not present

## 2024-05-14 DIAGNOSIS — R41841 Cognitive communication deficit: Secondary | ICD-10-CM | POA: Diagnosis not present

## 2024-05-15 DIAGNOSIS — S22070D Wedge compression fracture of T9-T10 vertebra, subsequent encounter for fracture with routine healing: Secondary | ICD-10-CM | POA: Diagnosis not present

## 2024-05-16 DIAGNOSIS — I2510 Atherosclerotic heart disease of native coronary artery without angina pectoris: Secondary | ICD-10-CM | POA: Diagnosis not present

## 2024-05-16 DIAGNOSIS — M1990 Unspecified osteoarthritis, unspecified site: Secondary | ICD-10-CM | POA: Diagnosis not present

## 2024-05-16 DIAGNOSIS — J4489 Other specified chronic obstructive pulmonary disease: Secondary | ICD-10-CM | POA: Diagnosis not present

## 2024-05-16 DIAGNOSIS — I110 Hypertensive heart disease with heart failure: Secondary | ICD-10-CM | POA: Diagnosis not present

## 2024-05-16 DIAGNOSIS — E785 Hyperlipidemia, unspecified: Secondary | ICD-10-CM | POA: Diagnosis not present

## 2024-05-16 DIAGNOSIS — E871 Hypo-osmolality and hyponatremia: Secondary | ICD-10-CM | POA: Diagnosis not present

## 2024-05-16 DIAGNOSIS — K5900 Constipation, unspecified: Secondary | ICD-10-CM | POA: Diagnosis not present

## 2024-05-16 DIAGNOSIS — I5032 Chronic diastolic (congestive) heart failure: Secondary | ICD-10-CM | POA: Diagnosis not present

## 2024-05-17 DIAGNOSIS — Z950 Presence of cardiac pacemaker: Secondary | ICD-10-CM | POA: Diagnosis not present

## 2024-05-18 DIAGNOSIS — M1990 Unspecified osteoarthritis, unspecified site: Secondary | ICD-10-CM | POA: Diagnosis not present

## 2024-05-18 DIAGNOSIS — K5900 Constipation, unspecified: Secondary | ICD-10-CM | POA: Diagnosis not present

## 2024-05-18 DIAGNOSIS — E871 Hypo-osmolality and hyponatremia: Secondary | ICD-10-CM | POA: Diagnosis not present

## 2024-05-18 DIAGNOSIS — J4489 Other specified chronic obstructive pulmonary disease: Secondary | ICD-10-CM | POA: Diagnosis not present

## 2024-05-18 DIAGNOSIS — I2510 Atherosclerotic heart disease of native coronary artery without angina pectoris: Secondary | ICD-10-CM | POA: Diagnosis not present

## 2024-05-18 DIAGNOSIS — I110 Hypertensive heart disease with heart failure: Secondary | ICD-10-CM | POA: Diagnosis not present

## 2024-05-18 DIAGNOSIS — E785 Hyperlipidemia, unspecified: Secondary | ICD-10-CM | POA: Diagnosis not present

## 2024-05-18 DIAGNOSIS — I5032 Chronic diastolic (congestive) heart failure: Secondary | ICD-10-CM | POA: Diagnosis not present

## 2024-05-19 DIAGNOSIS — I5032 Chronic diastolic (congestive) heart failure: Secondary | ICD-10-CM | POA: Diagnosis not present

## 2024-05-19 DIAGNOSIS — I2510 Atherosclerotic heart disease of native coronary artery without angina pectoris: Secondary | ICD-10-CM | POA: Diagnosis not present

## 2024-05-19 DIAGNOSIS — I110 Hypertensive heart disease with heart failure: Secondary | ICD-10-CM | POA: Diagnosis not present

## 2024-05-19 DIAGNOSIS — E785 Hyperlipidemia, unspecified: Secondary | ICD-10-CM | POA: Diagnosis not present

## 2024-05-19 DIAGNOSIS — M1990 Unspecified osteoarthritis, unspecified site: Secondary | ICD-10-CM | POA: Diagnosis not present

## 2024-05-19 DIAGNOSIS — E871 Hypo-osmolality and hyponatremia: Secondary | ICD-10-CM | POA: Diagnosis not present

## 2024-05-19 DIAGNOSIS — K5900 Constipation, unspecified: Secondary | ICD-10-CM | POA: Diagnosis not present

## 2024-05-19 DIAGNOSIS — J4489 Other specified chronic obstructive pulmonary disease: Secondary | ICD-10-CM | POA: Diagnosis not present

## 2024-05-23 DIAGNOSIS — J4489 Other specified chronic obstructive pulmonary disease: Secondary | ICD-10-CM | POA: Diagnosis not present

## 2024-05-23 DIAGNOSIS — E871 Hypo-osmolality and hyponatremia: Secondary | ICD-10-CM | POA: Diagnosis not present

## 2024-05-23 DIAGNOSIS — I5032 Chronic diastolic (congestive) heart failure: Secondary | ICD-10-CM | POA: Diagnosis not present

## 2024-05-23 DIAGNOSIS — M1990 Unspecified osteoarthritis, unspecified site: Secondary | ICD-10-CM | POA: Diagnosis not present

## 2024-05-23 DIAGNOSIS — I2510 Atherosclerotic heart disease of native coronary artery without angina pectoris: Secondary | ICD-10-CM | POA: Diagnosis not present

## 2024-05-23 DIAGNOSIS — K5900 Constipation, unspecified: Secondary | ICD-10-CM | POA: Diagnosis not present

## 2024-05-23 DIAGNOSIS — I110 Hypertensive heart disease with heart failure: Secondary | ICD-10-CM | POA: Diagnosis not present

## 2024-05-23 DIAGNOSIS — E785 Hyperlipidemia, unspecified: Secondary | ICD-10-CM | POA: Diagnosis not present

## 2024-05-24 DIAGNOSIS — K5900 Constipation, unspecified: Secondary | ICD-10-CM | POA: Diagnosis not present

## 2024-05-24 DIAGNOSIS — M1990 Unspecified osteoarthritis, unspecified site: Secondary | ICD-10-CM | POA: Diagnosis not present

## 2024-05-24 DIAGNOSIS — I110 Hypertensive heart disease with heart failure: Secondary | ICD-10-CM | POA: Diagnosis not present

## 2024-05-24 DIAGNOSIS — I5032 Chronic diastolic (congestive) heart failure: Secondary | ICD-10-CM | POA: Diagnosis not present

## 2024-05-24 DIAGNOSIS — E785 Hyperlipidemia, unspecified: Secondary | ICD-10-CM | POA: Diagnosis not present

## 2024-05-24 DIAGNOSIS — E871 Hypo-osmolality and hyponatremia: Secondary | ICD-10-CM | POA: Diagnosis not present

## 2024-05-24 DIAGNOSIS — I2510 Atherosclerotic heart disease of native coronary artery without angina pectoris: Secondary | ICD-10-CM | POA: Diagnosis not present

## 2024-05-24 DIAGNOSIS — J4489 Other specified chronic obstructive pulmonary disease: Secondary | ICD-10-CM | POA: Diagnosis not present

## 2024-05-25 ENCOUNTER — Other Ambulatory Visit (HOSPITAL_BASED_OUTPATIENT_CLINIC_OR_DEPARTMENT_OTHER): Payer: Self-pay | Admitting: Cardiovascular Disease

## 2024-05-26 DIAGNOSIS — M4696 Unspecified inflammatory spondylopathy, lumbar region: Secondary | ICD-10-CM | POA: Diagnosis not present

## 2024-05-26 DIAGNOSIS — M1990 Unspecified osteoarthritis, unspecified site: Secondary | ICD-10-CM | POA: Diagnosis not present

## 2024-05-26 DIAGNOSIS — I5020 Unspecified systolic (congestive) heart failure: Secondary | ICD-10-CM | POA: Diagnosis not present

## 2024-05-26 DIAGNOSIS — J4489 Other specified chronic obstructive pulmonary disease: Secondary | ICD-10-CM | POA: Diagnosis not present

## 2024-05-26 DIAGNOSIS — E785 Hyperlipidemia, unspecified: Secondary | ICD-10-CM | POA: Diagnosis not present

## 2024-05-26 DIAGNOSIS — K5900 Constipation, unspecified: Secondary | ICD-10-CM | POA: Diagnosis not present

## 2024-05-26 DIAGNOSIS — I129 Hypertensive chronic kidney disease with stage 1 through stage 4 chronic kidney disease, or unspecified chronic kidney disease: Secondary | ICD-10-CM | POA: Diagnosis not present

## 2024-05-26 DIAGNOSIS — I2510 Atherosclerotic heart disease of native coronary artery without angina pectoris: Secondary | ICD-10-CM | POA: Diagnosis not present

## 2024-05-26 DIAGNOSIS — E871 Hypo-osmolality and hyponatremia: Secondary | ICD-10-CM | POA: Diagnosis not present

## 2024-05-26 DIAGNOSIS — I5032 Chronic diastolic (congestive) heart failure: Secondary | ICD-10-CM | POA: Diagnosis not present

## 2024-05-26 DIAGNOSIS — I110 Hypertensive heart disease with heart failure: Secondary | ICD-10-CM | POA: Diagnosis not present

## 2024-05-31 DIAGNOSIS — I5032 Chronic diastolic (congestive) heart failure: Secondary | ICD-10-CM | POA: Diagnosis not present

## 2024-05-31 DIAGNOSIS — I2510 Atherosclerotic heart disease of native coronary artery without angina pectoris: Secondary | ICD-10-CM | POA: Diagnosis not present

## 2024-05-31 DIAGNOSIS — J4489 Other specified chronic obstructive pulmonary disease: Secondary | ICD-10-CM | POA: Diagnosis not present

## 2024-05-31 DIAGNOSIS — K5900 Constipation, unspecified: Secondary | ICD-10-CM | POA: Diagnosis not present

## 2024-05-31 DIAGNOSIS — I110 Hypertensive heart disease with heart failure: Secondary | ICD-10-CM | POA: Diagnosis not present

## 2024-05-31 DIAGNOSIS — E871 Hypo-osmolality and hyponatremia: Secondary | ICD-10-CM | POA: Diagnosis not present

## 2024-05-31 DIAGNOSIS — E785 Hyperlipidemia, unspecified: Secondary | ICD-10-CM | POA: Diagnosis not present

## 2024-05-31 DIAGNOSIS — M1990 Unspecified osteoarthritis, unspecified site: Secondary | ICD-10-CM | POA: Diagnosis not present

## 2024-06-01 DIAGNOSIS — I110 Hypertensive heart disease with heart failure: Secondary | ICD-10-CM | POA: Diagnosis not present

## 2024-06-01 DIAGNOSIS — I2510 Atherosclerotic heart disease of native coronary artery without angina pectoris: Secondary | ICD-10-CM | POA: Diagnosis not present

## 2024-06-01 DIAGNOSIS — E871 Hypo-osmolality and hyponatremia: Secondary | ICD-10-CM | POA: Diagnosis not present

## 2024-06-01 DIAGNOSIS — E785 Hyperlipidemia, unspecified: Secondary | ICD-10-CM | POA: Diagnosis not present

## 2024-06-01 DIAGNOSIS — J4489 Other specified chronic obstructive pulmonary disease: Secondary | ICD-10-CM | POA: Diagnosis not present

## 2024-06-01 DIAGNOSIS — M1990 Unspecified osteoarthritis, unspecified site: Secondary | ICD-10-CM | POA: Diagnosis not present

## 2024-06-01 DIAGNOSIS — K5900 Constipation, unspecified: Secondary | ICD-10-CM | POA: Diagnosis not present

## 2024-06-01 DIAGNOSIS — I5032 Chronic diastolic (congestive) heart failure: Secondary | ICD-10-CM | POA: Diagnosis not present

## 2024-06-04 DIAGNOSIS — I5020 Unspecified systolic (congestive) heart failure: Secondary | ICD-10-CM | POA: Diagnosis not present

## 2024-06-07 DIAGNOSIS — E871 Hypo-osmolality and hyponatremia: Secondary | ICD-10-CM | POA: Diagnosis not present

## 2024-06-07 DIAGNOSIS — J4489 Other specified chronic obstructive pulmonary disease: Secondary | ICD-10-CM | POA: Diagnosis not present

## 2024-06-07 DIAGNOSIS — M1990 Unspecified osteoarthritis, unspecified site: Secondary | ICD-10-CM | POA: Diagnosis not present

## 2024-06-07 DIAGNOSIS — I110 Hypertensive heart disease with heart failure: Secondary | ICD-10-CM | POA: Diagnosis not present

## 2024-06-07 DIAGNOSIS — E785 Hyperlipidemia, unspecified: Secondary | ICD-10-CM | POA: Diagnosis not present

## 2024-06-07 DIAGNOSIS — K5900 Constipation, unspecified: Secondary | ICD-10-CM | POA: Diagnosis not present

## 2024-06-07 DIAGNOSIS — I5032 Chronic diastolic (congestive) heart failure: Secondary | ICD-10-CM | POA: Diagnosis not present

## 2024-06-07 DIAGNOSIS — I2510 Atherosclerotic heart disease of native coronary artery without angina pectoris: Secondary | ICD-10-CM | POA: Diagnosis not present

## 2024-06-08 DIAGNOSIS — I110 Hypertensive heart disease with heart failure: Secondary | ICD-10-CM | POA: Diagnosis not present

## 2024-06-08 DIAGNOSIS — E871 Hypo-osmolality and hyponatremia: Secondary | ICD-10-CM | POA: Diagnosis not present

## 2024-06-08 DIAGNOSIS — I5032 Chronic diastolic (congestive) heart failure: Secondary | ICD-10-CM | POA: Diagnosis not present

## 2024-06-08 DIAGNOSIS — H04123 Dry eye syndrome of bilateral lacrimal glands: Secondary | ICD-10-CM | POA: Diagnosis not present

## 2024-06-08 DIAGNOSIS — H02831 Dermatochalasis of right upper eyelid: Secondary | ICD-10-CM | POA: Diagnosis not present

## 2024-06-08 DIAGNOSIS — J4489 Other specified chronic obstructive pulmonary disease: Secondary | ICD-10-CM | POA: Diagnosis not present

## 2024-06-08 DIAGNOSIS — I2510 Atherosclerotic heart disease of native coronary artery without angina pectoris: Secondary | ICD-10-CM | POA: Diagnosis not present

## 2024-06-08 DIAGNOSIS — E119 Type 2 diabetes mellitus without complications: Secondary | ICD-10-CM | POA: Diagnosis not present

## 2024-06-08 DIAGNOSIS — H40013 Open angle with borderline findings, low risk, bilateral: Secondary | ICD-10-CM | POA: Diagnosis not present

## 2024-06-08 DIAGNOSIS — M1990 Unspecified osteoarthritis, unspecified site: Secondary | ICD-10-CM | POA: Diagnosis not present

## 2024-06-08 DIAGNOSIS — H04413 Chronic dacryocystitis of bilateral lacrimal passages: Secondary | ICD-10-CM | POA: Diagnosis not present

## 2024-06-08 DIAGNOSIS — H02834 Dermatochalasis of left upper eyelid: Secondary | ICD-10-CM | POA: Diagnosis not present

## 2024-06-08 DIAGNOSIS — E785 Hyperlipidemia, unspecified: Secondary | ICD-10-CM | POA: Diagnosis not present

## 2024-06-08 DIAGNOSIS — H1045 Other chronic allergic conjunctivitis: Secondary | ICD-10-CM | POA: Diagnosis not present

## 2024-06-08 DIAGNOSIS — K5900 Constipation, unspecified: Secondary | ICD-10-CM | POA: Diagnosis not present

## 2024-06-10 DIAGNOSIS — Z950 Presence of cardiac pacemaker: Secondary | ICD-10-CM | POA: Diagnosis not present

## 2024-06-10 DIAGNOSIS — I2510 Atherosclerotic heart disease of native coronary artery without angina pectoris: Secondary | ICD-10-CM | POA: Diagnosis not present

## 2024-06-10 DIAGNOSIS — E1122 Type 2 diabetes mellitus with diabetic chronic kidney disease: Secondary | ICD-10-CM | POA: Diagnosis not present

## 2024-06-10 DIAGNOSIS — R634 Abnormal weight loss: Secondary | ICD-10-CM | POA: Diagnosis not present

## 2024-06-10 DIAGNOSIS — I10 Essential (primary) hypertension: Secondary | ICD-10-CM | POA: Diagnosis not present

## 2024-06-13 DIAGNOSIS — I110 Hypertensive heart disease with heart failure: Secondary | ICD-10-CM | POA: Diagnosis not present

## 2024-06-13 DIAGNOSIS — M1990 Unspecified osteoarthritis, unspecified site: Secondary | ICD-10-CM | POA: Diagnosis not present

## 2024-06-13 DIAGNOSIS — I5032 Chronic diastolic (congestive) heart failure: Secondary | ICD-10-CM | POA: Diagnosis not present

## 2024-06-13 DIAGNOSIS — I2510 Atherosclerotic heart disease of native coronary artery without angina pectoris: Secondary | ICD-10-CM | POA: Diagnosis not present

## 2024-06-13 DIAGNOSIS — E871 Hypo-osmolality and hyponatremia: Secondary | ICD-10-CM | POA: Diagnosis not present

## 2024-06-13 DIAGNOSIS — K5900 Constipation, unspecified: Secondary | ICD-10-CM | POA: Diagnosis not present

## 2024-06-13 DIAGNOSIS — J4489 Other specified chronic obstructive pulmonary disease: Secondary | ICD-10-CM | POA: Diagnosis not present

## 2024-06-13 DIAGNOSIS — E785 Hyperlipidemia, unspecified: Secondary | ICD-10-CM | POA: Diagnosis not present

## 2024-06-15 DIAGNOSIS — S22070D Wedge compression fracture of T9-T10 vertebra, subsequent encounter for fracture with routine healing: Secondary | ICD-10-CM | POA: Diagnosis not present

## 2024-06-16 DIAGNOSIS — I2510 Atherosclerotic heart disease of native coronary artery without angina pectoris: Secondary | ICD-10-CM | POA: Diagnosis not present

## 2024-06-16 DIAGNOSIS — I5032 Chronic diastolic (congestive) heart failure: Secondary | ICD-10-CM | POA: Diagnosis not present

## 2024-06-16 DIAGNOSIS — E785 Hyperlipidemia, unspecified: Secondary | ICD-10-CM | POA: Diagnosis not present

## 2024-06-16 DIAGNOSIS — M1990 Unspecified osteoarthritis, unspecified site: Secondary | ICD-10-CM | POA: Diagnosis not present

## 2024-06-16 DIAGNOSIS — E871 Hypo-osmolality and hyponatremia: Secondary | ICD-10-CM | POA: Diagnosis not present

## 2024-06-16 DIAGNOSIS — K5900 Constipation, unspecified: Secondary | ICD-10-CM | POA: Diagnosis not present

## 2024-06-16 DIAGNOSIS — I110 Hypertensive heart disease with heart failure: Secondary | ICD-10-CM | POA: Diagnosis not present

## 2024-06-16 DIAGNOSIS — J4489 Other specified chronic obstructive pulmonary disease: Secondary | ICD-10-CM | POA: Diagnosis not present

## 2024-06-17 DIAGNOSIS — Z45010 Encounter for checking and testing of cardiac pacemaker pulse generator [battery]: Secondary | ICD-10-CM | POA: Diagnosis not present

## 2024-06-21 DIAGNOSIS — I5032 Chronic diastolic (congestive) heart failure: Secondary | ICD-10-CM | POA: Diagnosis not present

## 2024-06-21 DIAGNOSIS — K5900 Constipation, unspecified: Secondary | ICD-10-CM | POA: Diagnosis not present

## 2024-06-21 DIAGNOSIS — E785 Hyperlipidemia, unspecified: Secondary | ICD-10-CM | POA: Diagnosis not present

## 2024-06-21 DIAGNOSIS — J4489 Other specified chronic obstructive pulmonary disease: Secondary | ICD-10-CM | POA: Diagnosis not present

## 2024-06-21 DIAGNOSIS — I2510 Atherosclerotic heart disease of native coronary artery without angina pectoris: Secondary | ICD-10-CM | POA: Diagnosis not present

## 2024-06-21 DIAGNOSIS — I110 Hypertensive heart disease with heart failure: Secondary | ICD-10-CM | POA: Diagnosis not present

## 2024-06-21 DIAGNOSIS — M1990 Unspecified osteoarthritis, unspecified site: Secondary | ICD-10-CM | POA: Diagnosis not present

## 2024-06-21 DIAGNOSIS — E871 Hypo-osmolality and hyponatremia: Secondary | ICD-10-CM | POA: Diagnosis not present

## 2024-06-22 ENCOUNTER — Other Ambulatory Visit (HOSPITAL_BASED_OUTPATIENT_CLINIC_OR_DEPARTMENT_OTHER): Payer: Self-pay | Admitting: Cardiovascular Disease

## 2024-06-26 DIAGNOSIS — I5032 Chronic diastolic (congestive) heart failure: Secondary | ICD-10-CM | POA: Diagnosis not present

## 2024-06-26 DIAGNOSIS — I5020 Unspecified systolic (congestive) heart failure: Secondary | ICD-10-CM | POA: Diagnosis not present

## 2024-06-26 DIAGNOSIS — I110 Hypertensive heart disease with heart failure: Secondary | ICD-10-CM | POA: Diagnosis not present

## 2024-06-26 DIAGNOSIS — M4696 Unspecified inflammatory spondylopathy, lumbar region: Secondary | ICD-10-CM | POA: Diagnosis not present

## 2024-06-26 DIAGNOSIS — I129 Hypertensive chronic kidney disease with stage 1 through stage 4 chronic kidney disease, or unspecified chronic kidney disease: Secondary | ICD-10-CM | POA: Diagnosis not present

## 2024-06-28 DIAGNOSIS — I110 Hypertensive heart disease with heart failure: Secondary | ICD-10-CM | POA: Diagnosis not present

## 2024-06-28 DIAGNOSIS — E785 Hyperlipidemia, unspecified: Secondary | ICD-10-CM | POA: Diagnosis not present

## 2024-06-28 DIAGNOSIS — J4489 Other specified chronic obstructive pulmonary disease: Secondary | ICD-10-CM | POA: Diagnosis not present

## 2024-06-28 DIAGNOSIS — I2510 Atherosclerotic heart disease of native coronary artery without angina pectoris: Secondary | ICD-10-CM | POA: Diagnosis not present

## 2024-06-28 DIAGNOSIS — I5032 Chronic diastolic (congestive) heart failure: Secondary | ICD-10-CM | POA: Diagnosis not present

## 2024-06-28 DIAGNOSIS — M1990 Unspecified osteoarthritis, unspecified site: Secondary | ICD-10-CM | POA: Diagnosis not present

## 2024-06-28 DIAGNOSIS — K5900 Constipation, unspecified: Secondary | ICD-10-CM | POA: Diagnosis not present

## 2024-06-28 DIAGNOSIS — E871 Hypo-osmolality and hyponatremia: Secondary | ICD-10-CM | POA: Diagnosis not present

## 2024-06-29 DIAGNOSIS — S22078D Other fracture of T9-T10 vertebra, subsequent encounter for fracture with routine healing: Secondary | ICD-10-CM | POA: Diagnosis not present

## 2024-06-29 DIAGNOSIS — S22070D Wedge compression fracture of T9-T10 vertebra, subsequent encounter for fracture with routine healing: Secondary | ICD-10-CM | POA: Diagnosis not present

## 2024-06-29 DIAGNOSIS — S22048D Other fracture of fourth thoracic vertebra, subsequent encounter for fracture with routine healing: Secondary | ICD-10-CM | POA: Diagnosis not present

## 2024-07-04 DIAGNOSIS — I10 Essential (primary) hypertension: Secondary | ICD-10-CM | POA: Diagnosis not present

## 2024-07-04 DIAGNOSIS — I5020 Unspecified systolic (congestive) heart failure: Secondary | ICD-10-CM | POA: Diagnosis not present

## 2024-07-04 DIAGNOSIS — Z950 Presence of cardiac pacemaker: Secondary | ICD-10-CM | POA: Diagnosis not present

## 2024-07-04 DIAGNOSIS — G4733 Obstructive sleep apnea (adult) (pediatric): Secondary | ICD-10-CM | POA: Diagnosis not present

## 2024-07-04 DIAGNOSIS — I2510 Atherosclerotic heart disease of native coronary artery without angina pectoris: Secondary | ICD-10-CM | POA: Diagnosis not present

## 2024-07-04 DIAGNOSIS — Z23 Encounter for immunization: Secondary | ICD-10-CM | POA: Diagnosis not present

## 2024-07-04 DIAGNOSIS — I5032 Chronic diastolic (congestive) heart failure: Secondary | ICD-10-CM | POA: Diagnosis not present

## 2024-07-04 DIAGNOSIS — R54 Age-related physical debility: Secondary | ICD-10-CM | POA: Diagnosis not present

## 2024-07-04 DIAGNOSIS — Z1331 Encounter for screening for depression: Secondary | ICD-10-CM | POA: Diagnosis not present

## 2024-07-04 DIAGNOSIS — Z0000 Encounter for general adult medical examination without abnormal findings: Secondary | ICD-10-CM | POA: Diagnosis not present

## 2024-07-04 DIAGNOSIS — Z136 Encounter for screening for cardiovascular disorders: Secondary | ICD-10-CM | POA: Diagnosis not present

## 2024-07-04 DIAGNOSIS — E1122 Type 2 diabetes mellitus with diabetic chronic kidney disease: Secondary | ICD-10-CM | POA: Diagnosis not present

## 2024-07-16 DIAGNOSIS — S22070D Wedge compression fracture of T9-T10 vertebra, subsequent encounter for fracture with routine healing: Secondary | ICD-10-CM | POA: Diagnosis not present

## 2024-07-26 DIAGNOSIS — I5032 Chronic diastolic (congestive) heart failure: Secondary | ICD-10-CM | POA: Diagnosis not present

## 2024-07-26 DIAGNOSIS — I5020 Unspecified systolic (congestive) heart failure: Secondary | ICD-10-CM | POA: Diagnosis not present

## 2024-07-26 DIAGNOSIS — I129 Hypertensive chronic kidney disease with stage 1 through stage 4 chronic kidney disease, or unspecified chronic kidney disease: Secondary | ICD-10-CM | POA: Diagnosis not present

## 2024-07-26 DIAGNOSIS — M4696 Unspecified inflammatory spondylopathy, lumbar region: Secondary | ICD-10-CM | POA: Diagnosis not present

## 2024-07-26 DIAGNOSIS — I110 Hypertensive heart disease with heart failure: Secondary | ICD-10-CM | POA: Diagnosis not present

## 2024-08-02 DIAGNOSIS — N3281 Overactive bladder: Secondary | ICD-10-CM | POA: Diagnosis not present

## 2024-08-03 DIAGNOSIS — I5020 Unspecified systolic (congestive) heart failure: Secondary | ICD-10-CM | POA: Diagnosis not present

## 2024-08-10 DIAGNOSIS — R0789 Other chest pain: Secondary | ICD-10-CM | POA: Diagnosis not present

## 2024-08-10 DIAGNOSIS — Z950 Presence of cardiac pacemaker: Secondary | ICD-10-CM | POA: Diagnosis not present

## 2024-08-10 DIAGNOSIS — R609 Edema, unspecified: Secondary | ICD-10-CM | POA: Diagnosis not present

## 2024-08-10 DIAGNOSIS — E291 Testicular hypofunction: Secondary | ICD-10-CM | POA: Diagnosis not present

## 2024-08-10 DIAGNOSIS — Z87898 Personal history of other specified conditions: Secondary | ICD-10-CM | POA: Diagnosis not present

## 2024-08-12 DIAGNOSIS — E291 Testicular hypofunction: Secondary | ICD-10-CM | POA: Diagnosis not present

## 2024-08-15 DIAGNOSIS — S22070D Wedge compression fracture of T9-T10 vertebra, subsequent encounter for fracture with routine healing: Secondary | ICD-10-CM | POA: Diagnosis not present

## 2024-08-21 DIAGNOSIS — B369 Superficial mycosis, unspecified: Secondary | ICD-10-CM | POA: Diagnosis not present

## 2024-08-21 DIAGNOSIS — D508 Other iron deficiency anemias: Secondary | ICD-10-CM | POA: Diagnosis not present

## 2024-08-21 DIAGNOSIS — I4891 Unspecified atrial fibrillation: Secondary | ICD-10-CM | POA: Diagnosis not present

## 2024-08-21 DIAGNOSIS — D509 Iron deficiency anemia, unspecified: Secondary | ICD-10-CM | POA: Diagnosis not present

## 2024-08-21 DIAGNOSIS — N183 Chronic kidney disease, stage 3 unspecified: Secondary | ICD-10-CM | POA: Diagnosis not present

## 2024-08-21 DIAGNOSIS — R930 Abnormal findings on diagnostic imaging of skull and head, not elsewhere classified: Secondary | ICD-10-CM | POA: Diagnosis not present

## 2024-08-21 DIAGNOSIS — S72002D Fracture of unspecified part of neck of left femur, subsequent encounter for closed fracture with routine healing: Secondary | ICD-10-CM | POA: Diagnosis not present

## 2024-08-21 DIAGNOSIS — M80052A Age-related osteoporosis with current pathological fracture, left femur, initial encounter for fracture: Secondary | ICD-10-CM | POA: Diagnosis not present

## 2024-08-21 DIAGNOSIS — I63433 Cerebral infarction due to embolism of bilateral posterior cerebral arteries: Secondary | ICD-10-CM | POA: Diagnosis not present

## 2024-08-21 DIAGNOSIS — I5032 Chronic diastolic (congestive) heart failure: Secondary | ICD-10-CM | POA: Diagnosis not present

## 2024-08-21 DIAGNOSIS — E785 Hyperlipidemia, unspecified: Secondary | ICD-10-CM | POA: Diagnosis not present

## 2024-08-21 DIAGNOSIS — Z4789 Encounter for other orthopedic aftercare: Secondary | ICD-10-CM | POA: Diagnosis not present

## 2024-08-21 DIAGNOSIS — I2510 Atherosclerotic heart disease of native coronary artery without angina pectoris: Secondary | ICD-10-CM | POA: Diagnosis not present

## 2024-08-21 DIAGNOSIS — E119 Type 2 diabetes mellitus without complications: Secondary | ICD-10-CM | POA: Diagnosis not present

## 2024-08-21 DIAGNOSIS — R1312 Dysphagia, oropharyngeal phase: Secondary | ICD-10-CM | POA: Diagnosis not present

## 2024-08-21 DIAGNOSIS — E669 Obesity, unspecified: Secondary | ICD-10-CM | POA: Diagnosis not present

## 2024-08-21 DIAGNOSIS — W19XXXA Unspecified fall, initial encounter: Secondary | ICD-10-CM | POA: Diagnosis not present

## 2024-08-21 DIAGNOSIS — Z139 Encounter for screening, unspecified: Secondary | ICD-10-CM | POA: Diagnosis not present

## 2024-08-21 DIAGNOSIS — N401 Enlarged prostate with lower urinary tract symptoms: Secondary | ICD-10-CM | POA: Diagnosis not present

## 2024-08-21 DIAGNOSIS — I63532 Cerebral infarction due to unspecified occlusion or stenosis of left posterior cerebral artery: Secondary | ICD-10-CM | POA: Diagnosis not present

## 2024-08-21 DIAGNOSIS — S199XXA Unspecified injury of neck, initial encounter: Secondary | ICD-10-CM | POA: Diagnosis not present

## 2024-08-21 DIAGNOSIS — I6523 Occlusion and stenosis of bilateral carotid arteries: Secondary | ICD-10-CM | POA: Diagnosis not present

## 2024-08-21 DIAGNOSIS — S7292XA Unspecified fracture of left femur, initial encounter for closed fracture: Secondary | ICD-10-CM | POA: Diagnosis not present

## 2024-08-21 DIAGNOSIS — I69392 Facial weakness following cerebral infarction: Secondary | ICD-10-CM | POA: Diagnosis not present

## 2024-08-21 DIAGNOSIS — E1122 Type 2 diabetes mellitus with diabetic chronic kidney disease: Secondary | ICD-10-CM | POA: Diagnosis not present

## 2024-08-21 DIAGNOSIS — I671 Cerebral aneurysm, nonruptured: Secondary | ICD-10-CM | POA: Diagnosis not present

## 2024-08-21 DIAGNOSIS — I071 Rheumatic tricuspid insufficiency: Secondary | ICD-10-CM | POA: Diagnosis not present

## 2024-08-21 DIAGNOSIS — M1612 Unilateral primary osteoarthritis, left hip: Secondary | ICD-10-CM | POA: Diagnosis not present

## 2024-08-21 DIAGNOSIS — I130 Hypertensive heart and chronic kidney disease with heart failure and stage 1 through stage 4 chronic kidney disease, or unspecified chronic kidney disease: Secondary | ICD-10-CM | POA: Diagnosis not present

## 2024-08-21 DIAGNOSIS — Z8673 Personal history of transient ischemic attack (TIA), and cerebral infarction without residual deficits: Secondary | ICD-10-CM | POA: Diagnosis not present

## 2024-08-21 DIAGNOSIS — I517 Cardiomegaly: Secondary | ICD-10-CM | POA: Diagnosis not present

## 2024-08-21 DIAGNOSIS — I69312 Visuospatial deficit and spatial neglect following cerebral infarction: Secondary | ICD-10-CM | POA: Diagnosis not present

## 2024-08-21 DIAGNOSIS — I110 Hypertensive heart disease with heart failure: Secondary | ICD-10-CM | POA: Diagnosis not present

## 2024-08-21 DIAGNOSIS — I6502 Occlusion and stenosis of left vertebral artery: Secondary | ICD-10-CM | POA: Diagnosis not present

## 2024-08-21 DIAGNOSIS — S72002A Fracture of unspecified part of neck of left femur, initial encounter for closed fracture: Secondary | ICD-10-CM | POA: Diagnosis not present

## 2024-08-25 DIAGNOSIS — M4696 Unspecified inflammatory spondylopathy, lumbar region: Secondary | ICD-10-CM | POA: Diagnosis not present

## 2024-08-25 DIAGNOSIS — E1122 Type 2 diabetes mellitus with diabetic chronic kidney disease: Secondary | ICD-10-CM | POA: Diagnosis not present

## 2024-08-25 DIAGNOSIS — S72002D Fracture of unspecified part of neck of left femur, subsequent encounter for closed fracture with routine healing: Secondary | ICD-10-CM | POA: Diagnosis not present

## 2024-08-25 DIAGNOSIS — I5032 Chronic diastolic (congestive) heart failure: Secondary | ICD-10-CM | POA: Diagnosis not present

## 2024-08-25 DIAGNOSIS — E785 Hyperlipidemia, unspecified: Secondary | ICD-10-CM | POA: Diagnosis not present

## 2024-08-25 DIAGNOSIS — I480 Paroxysmal atrial fibrillation: Secondary | ICD-10-CM | POA: Diagnosis not present

## 2024-08-25 DIAGNOSIS — R1312 Dysphagia, oropharyngeal phase: Secondary | ICD-10-CM | POA: Diagnosis not present

## 2024-08-25 DIAGNOSIS — R001 Bradycardia, unspecified: Secondary | ICD-10-CM | POA: Diagnosis not present

## 2024-08-25 DIAGNOSIS — I639 Cerebral infarction, unspecified: Secondary | ICD-10-CM | POA: Diagnosis not present

## 2024-08-25 DIAGNOSIS — S72002A Fracture of unspecified part of neck of left femur, initial encounter for closed fracture: Secondary | ICD-10-CM | POA: Diagnosis not present

## 2024-08-25 DIAGNOSIS — Z4789 Encounter for other orthopedic aftercare: Secondary | ICD-10-CM | POA: Diagnosis not present

## 2024-08-25 DIAGNOSIS — R531 Weakness: Secondary | ICD-10-CM | POA: Diagnosis not present

## 2024-08-25 DIAGNOSIS — R414 Neurologic neglect syndrome: Secondary | ICD-10-CM | POA: Diagnosis not present

## 2024-08-25 DIAGNOSIS — I110 Hypertensive heart disease with heart failure: Secondary | ICD-10-CM | POA: Diagnosis not present

## 2024-08-25 DIAGNOSIS — I619 Nontraumatic intracerebral hemorrhage, unspecified: Secondary | ICD-10-CM | POA: Diagnosis not present

## 2024-08-25 DIAGNOSIS — E119 Type 2 diabetes mellitus without complications: Secondary | ICD-10-CM | POA: Diagnosis not present

## 2024-08-25 DIAGNOSIS — I5020 Unspecified systolic (congestive) heart failure: Secondary | ICD-10-CM | POA: Diagnosis not present

## 2024-08-25 DIAGNOSIS — I69392 Facial weakness following cerebral infarction: Secondary | ICD-10-CM | POA: Diagnosis not present

## 2024-08-25 DIAGNOSIS — N400 Enlarged prostate without lower urinary tract symptoms: Secondary | ICD-10-CM | POA: Diagnosis not present

## 2024-08-25 DIAGNOSIS — I69312 Visuospatial deficit and spatial neglect following cerebral infarction: Secondary | ICD-10-CM | POA: Diagnosis not present

## 2024-08-25 DIAGNOSIS — I10 Essential (primary) hypertension: Secondary | ICD-10-CM | POA: Diagnosis not present

## 2024-08-25 DIAGNOSIS — K219 Gastro-esophageal reflux disease without esophagitis: Secondary | ICD-10-CM | POA: Diagnosis not present

## 2024-08-25 DIAGNOSIS — N183 Chronic kidney disease, stage 3 unspecified: Secondary | ICD-10-CM | POA: Diagnosis not present

## 2024-08-25 DIAGNOSIS — N3281 Overactive bladder: Secondary | ICD-10-CM | POA: Diagnosis not present

## 2024-08-25 DIAGNOSIS — Z8673 Personal history of transient ischemic attack (TIA), and cerebral infarction without residual deficits: Secondary | ICD-10-CM | POA: Diagnosis not present

## 2024-08-25 DIAGNOSIS — G8929 Other chronic pain: Secondary | ICD-10-CM | POA: Diagnosis not present

## 2024-08-25 DIAGNOSIS — E782 Mixed hyperlipidemia: Secondary | ICD-10-CM | POA: Diagnosis not present

## 2024-08-25 DIAGNOSIS — K5909 Other constipation: Secondary | ICD-10-CM | POA: Diagnosis not present

## 2024-08-25 DIAGNOSIS — Z7901 Long term (current) use of anticoagulants: Secondary | ICD-10-CM | POA: Diagnosis not present

## 2024-08-25 DIAGNOSIS — I63433 Cerebral infarction due to embolism of bilateral posterior cerebral arteries: Secondary | ICD-10-CM | POA: Diagnosis not present

## 2024-08-25 DIAGNOSIS — I130 Hypertensive heart and chronic kidney disease with heart failure and stage 1 through stage 4 chronic kidney disease, or unspecified chronic kidney disease: Secondary | ICD-10-CM | POA: Diagnosis not present

## 2024-08-25 DIAGNOSIS — R4182 Altered mental status, unspecified: Secondary | ICD-10-CM | POA: Diagnosis not present

## 2024-08-25 DIAGNOSIS — I129 Hypertensive chronic kidney disease with stage 1 through stage 4 chronic kidney disease, or unspecified chronic kidney disease: Secondary | ICD-10-CM | POA: Diagnosis not present

## 2024-08-25 DIAGNOSIS — D509 Iron deficiency anemia, unspecified: Secondary | ICD-10-CM | POA: Diagnosis not present

## 2024-08-26 DIAGNOSIS — K219 Gastro-esophageal reflux disease without esophagitis: Secondary | ICD-10-CM | POA: Diagnosis not present

## 2024-08-26 DIAGNOSIS — M4696 Unspecified inflammatory spondylopathy, lumbar region: Secondary | ICD-10-CM | POA: Diagnosis not present

## 2024-08-26 DIAGNOSIS — I5020 Unspecified systolic (congestive) heart failure: Secondary | ICD-10-CM | POA: Diagnosis not present

## 2024-08-26 DIAGNOSIS — E119 Type 2 diabetes mellitus without complications: Secondary | ICD-10-CM | POA: Diagnosis not present

## 2024-08-26 DIAGNOSIS — K5909 Other constipation: Secondary | ICD-10-CM | POA: Diagnosis not present

## 2024-08-26 DIAGNOSIS — I63433 Cerebral infarction due to embolism of bilateral posterior cerebral arteries: Secondary | ICD-10-CM | POA: Diagnosis not present

## 2024-08-26 DIAGNOSIS — N3281 Overactive bladder: Secondary | ICD-10-CM | POA: Diagnosis not present

## 2024-08-26 DIAGNOSIS — I5032 Chronic diastolic (congestive) heart failure: Secondary | ICD-10-CM | POA: Diagnosis not present

## 2024-08-26 DIAGNOSIS — I129 Hypertensive chronic kidney disease with stage 1 through stage 4 chronic kidney disease, or unspecified chronic kidney disease: Secondary | ICD-10-CM | POA: Diagnosis not present

## 2024-08-26 DIAGNOSIS — N400 Enlarged prostate without lower urinary tract symptoms: Secondary | ICD-10-CM | POA: Diagnosis not present

## 2024-08-26 DIAGNOSIS — S72002D Fracture of unspecified part of neck of left femur, subsequent encounter for closed fracture with routine healing: Secondary | ICD-10-CM | POA: Diagnosis not present

## 2024-08-26 DIAGNOSIS — I110 Hypertensive heart disease with heart failure: Secondary | ICD-10-CM | POA: Diagnosis not present

## 2024-08-29 DIAGNOSIS — S72002D Fracture of unspecified part of neck of left femur, subsequent encounter for closed fracture with routine healing: Secondary | ICD-10-CM | POA: Diagnosis not present

## 2024-08-29 DIAGNOSIS — I110 Hypertensive heart disease with heart failure: Secondary | ICD-10-CM | POA: Diagnosis not present

## 2024-08-30 DIAGNOSIS — G8929 Other chronic pain: Secondary | ICD-10-CM | POA: Diagnosis not present

## 2024-09-02 DIAGNOSIS — E785 Hyperlipidemia, unspecified: Secondary | ICD-10-CM | POA: Diagnosis not present

## 2024-09-02 DIAGNOSIS — Z743 Need for continuous supervision: Secondary | ICD-10-CM | POA: Diagnosis not present

## 2024-09-02 DIAGNOSIS — Z8673 Personal history of transient ischemic attack (TIA), and cerebral infarction without residual deficits: Secondary | ICD-10-CM | POA: Diagnosis not present

## 2024-09-02 DIAGNOSIS — R531 Weakness: Secondary | ICD-10-CM | POA: Diagnosis not present

## 2024-09-02 DIAGNOSIS — I672 Cerebral atherosclerosis: Secondary | ICD-10-CM | POA: Diagnosis not present

## 2024-09-02 DIAGNOSIS — E1122 Type 2 diabetes mellitus with diabetic chronic kidney disease: Secondary | ICD-10-CM | POA: Diagnosis not present

## 2024-09-02 DIAGNOSIS — I480 Paroxysmal atrial fibrillation: Secondary | ICD-10-CM | POA: Diagnosis not present

## 2024-09-02 DIAGNOSIS — I5032 Chronic diastolic (congestive) heart failure: Secondary | ICD-10-CM | POA: Diagnosis not present

## 2024-09-02 DIAGNOSIS — I110 Hypertensive heart disease with heart failure: Secondary | ICD-10-CM | POA: Diagnosis not present

## 2024-09-02 DIAGNOSIS — I10 Essential (primary) hypertension: Secondary | ICD-10-CM | POA: Diagnosis not present

## 2024-09-02 DIAGNOSIS — Z950 Presence of cardiac pacemaker: Secondary | ICD-10-CM | POA: Diagnosis not present

## 2024-09-02 DIAGNOSIS — R4182 Altered mental status, unspecified: Secondary | ICD-10-CM | POA: Diagnosis not present

## 2024-09-02 DIAGNOSIS — Z7901 Long term (current) use of anticoagulants: Secondary | ICD-10-CM | POA: Diagnosis not present

## 2024-09-02 DIAGNOSIS — I130 Hypertensive heart and chronic kidney disease with heart failure and stage 1 through stage 4 chronic kidney disease, or unspecified chronic kidney disease: Secondary | ICD-10-CM | POA: Diagnosis not present

## 2024-09-02 DIAGNOSIS — G459 Transient cerebral ischemic attack, unspecified: Secondary | ICD-10-CM | POA: Diagnosis not present

## 2024-09-02 DIAGNOSIS — J90 Pleural effusion, not elsewhere classified: Secondary | ICD-10-CM | POA: Diagnosis not present

## 2024-09-02 DIAGNOSIS — I639 Cerebral infarction, unspecified: Secondary | ICD-10-CM | POA: Diagnosis not present

## 2024-09-02 DIAGNOSIS — R001 Bradycardia, unspecified: Secondary | ICD-10-CM | POA: Diagnosis not present

## 2024-09-02 DIAGNOSIS — I959 Hypotension, unspecified: Secondary | ICD-10-CM | POA: Diagnosis not present

## 2024-09-02 DIAGNOSIS — R9431 Abnormal electrocardiogram [ECG] [EKG]: Secondary | ICD-10-CM | POA: Diagnosis not present

## 2024-09-02 DIAGNOSIS — R414 Neurologic neglect syndrome: Secondary | ICD-10-CM | POA: Diagnosis not present

## 2024-09-02 DIAGNOSIS — I619 Nontraumatic intracerebral hemorrhage, unspecified: Secondary | ICD-10-CM | POA: Diagnosis not present

## 2024-09-02 DIAGNOSIS — S72002D Fracture of unspecified part of neck of left femur, subsequent encounter for closed fracture with routine healing: Secondary | ICD-10-CM | POA: Diagnosis not present

## 2024-09-02 DIAGNOSIS — E782 Mixed hyperlipidemia: Secondary | ICD-10-CM | POA: Diagnosis not present

## 2024-09-02 DIAGNOSIS — R0602 Shortness of breath: Secondary | ICD-10-CM | POA: Diagnosis not present

## 2024-09-02 DIAGNOSIS — D509 Iron deficiency anemia, unspecified: Secondary | ICD-10-CM | POA: Diagnosis not present

## 2024-09-02 DIAGNOSIS — I2510 Atherosclerotic heart disease of native coronary artery without angina pectoris: Secondary | ICD-10-CM | POA: Diagnosis not present

## 2024-09-02 DIAGNOSIS — R410 Disorientation, unspecified: Secondary | ICD-10-CM | POA: Diagnosis not present

## 2024-09-13 DIAGNOSIS — G8929 Other chronic pain: Secondary | ICD-10-CM | POA: Diagnosis not present

## 2024-09-13 DIAGNOSIS — I5032 Chronic diastolic (congestive) heart failure: Secondary | ICD-10-CM | POA: Diagnosis not present

## 2024-09-13 DIAGNOSIS — I63433 Cerebral infarction due to embolism of bilateral posterior cerebral arteries: Secondary | ICD-10-CM | POA: Diagnosis not present

## 2024-09-13 DIAGNOSIS — I110 Hypertensive heart disease with heart failure: Secondary | ICD-10-CM | POA: Diagnosis not present

## 2024-09-14 DIAGNOSIS — E119 Type 2 diabetes mellitus without complications: Secondary | ICD-10-CM | POA: Diagnosis not present

## 2024-09-14 DIAGNOSIS — I700 Atherosclerosis of aorta: Secondary | ICD-10-CM | POA: Diagnosis not present

## 2024-09-14 DIAGNOSIS — S72002D Fracture of unspecified part of neck of left femur, subsequent encounter for closed fracture with routine healing: Secondary | ICD-10-CM | POA: Diagnosis not present

## 2024-09-14 DIAGNOSIS — I110 Hypertensive heart disease with heart failure: Secondary | ICD-10-CM | POA: Diagnosis not present

## 2024-09-14 DIAGNOSIS — I6930 Unspecified sequelae of cerebral infarction: Secondary | ICD-10-CM | POA: Diagnosis not present

## 2024-09-14 DIAGNOSIS — I5032 Chronic diastolic (congestive) heart failure: Secondary | ICD-10-CM | POA: Diagnosis not present

## 2024-09-14 DIAGNOSIS — I63433 Cerebral infarction due to embolism of bilateral posterior cerebral arteries: Secondary | ICD-10-CM | POA: Diagnosis not present

## 2024-09-14 DIAGNOSIS — E785 Hyperlipidemia, unspecified: Secondary | ICD-10-CM | POA: Diagnosis not present

## 2024-09-19 DIAGNOSIS — R410 Disorientation, unspecified: Secondary | ICD-10-CM | POA: Diagnosis not present

## 2024-09-19 DIAGNOSIS — R443 Hallucinations, unspecified: Secondary | ICD-10-CM | POA: Diagnosis not present

## 2024-09-22 DIAGNOSIS — J9611 Chronic respiratory failure with hypoxia: Secondary | ICD-10-CM | POA: Diagnosis not present

## 2024-09-22 DIAGNOSIS — R001 Bradycardia, unspecified: Secondary | ICD-10-CM | POA: Diagnosis not present

## 2024-09-22 DIAGNOSIS — E119 Type 2 diabetes mellitus without complications: Secondary | ICD-10-CM | POA: Diagnosis not present

## 2024-09-22 DIAGNOSIS — I6930 Unspecified sequelae of cerebral infarction: Secondary | ICD-10-CM | POA: Diagnosis not present

## 2024-09-22 DIAGNOSIS — S72002D Fracture of unspecified part of neck of left femur, subsequent encounter for closed fracture with routine healing: Secondary | ICD-10-CM | POA: Diagnosis not present

## 2024-09-22 DIAGNOSIS — I110 Hypertensive heart disease with heart failure: Secondary | ICD-10-CM | POA: Diagnosis not present

## 2024-09-22 DIAGNOSIS — E785 Hyperlipidemia, unspecified: Secondary | ICD-10-CM | POA: Diagnosis not present

## 2024-09-29 DIAGNOSIS — R1310 Dysphagia, unspecified: Secondary | ICD-10-CM | POA: Diagnosis not present

## 2024-09-29 DIAGNOSIS — I6930 Unspecified sequelae of cerebral infarction: Secondary | ICD-10-CM | POA: Diagnosis not present

## 2024-10-04 DIAGNOSIS — R519 Headache, unspecified: Secondary | ICD-10-CM | POA: Diagnosis not present

## 2024-10-05 DIAGNOSIS — Z950 Presence of cardiac pacemaker: Secondary | ICD-10-CM | POA: Diagnosis not present
